# Patient Record
Sex: Female | Born: 1945 | Race: Black or African American | Hispanic: No | Marital: Single | Smoking: Never smoker
Health system: Southern US, Community
[De-identification: ages and names within clinical notes are randomized; demographics above are authoritative.]

## PROBLEM LIST (undated history)

## (undated) DIAGNOSIS — Z9289 Personal history of other medical treatment: Secondary | ICD-10-CM

## (undated) DIAGNOSIS — H269 Unspecified cataract: Secondary | ICD-10-CM

## (undated) DIAGNOSIS — E7800 Pure hypercholesterolemia, unspecified: Secondary | ICD-10-CM

## (undated) DIAGNOSIS — Z8619 Personal history of other infectious and parasitic diseases: Secondary | ICD-10-CM

## (undated) DIAGNOSIS — E559 Vitamin D deficiency, unspecified: Secondary | ICD-10-CM

## (undated) DIAGNOSIS — IMO0001 Reserved for inherently not codable concepts without codable children: Secondary | ICD-10-CM

## (undated) DIAGNOSIS — M129 Arthropathy, unspecified: Secondary | ICD-10-CM

## (undated) DIAGNOSIS — Z8669 Personal history of other diseases of the nervous system and sense organs: Secondary | ICD-10-CM

## (undated) DIAGNOSIS — Z86010 Personal history of colonic polyps: Secondary | ICD-10-CM

## (undated) DIAGNOSIS — F411 Generalized anxiety disorder: Secondary | ICD-10-CM

## (undated) DIAGNOSIS — R252 Cramp and spasm: Secondary | ICD-10-CM

## (undated) DIAGNOSIS — I10 Essential (primary) hypertension: Secondary | ICD-10-CM

## (undated) DIAGNOSIS — M545 Low back pain: Secondary | ICD-10-CM

## (undated) DIAGNOSIS — R7309 Other abnormal glucose: Secondary | ICD-10-CM

## (undated) DIAGNOSIS — N951 Menopausal and female climacteric states: Secondary | ICD-10-CM

## (undated) DIAGNOSIS — H3320 Serous retinal detachment, unspecified eye: Secondary | ICD-10-CM

## (undated) DIAGNOSIS — K219 Gastro-esophageal reflux disease without esophagitis: Secondary | ICD-10-CM

## (undated) DIAGNOSIS — M1990 Unspecified osteoarthritis, unspecified site: Secondary | ICD-10-CM

## (undated) DIAGNOSIS — E782 Mixed hyperlipidemia: Secondary | ICD-10-CM

## (undated) HISTORY — PX: ABDOMINAL HYSTERECTOMY: SHX81

## (undated) HISTORY — DX: Mixed hyperlipidemia: E78.2

## (undated) HISTORY — DX: Gastro-esophageal reflux disease without esophagitis: K21.9

## (undated) HISTORY — DX: Vitamin D deficiency, unspecified: E55.9

## (undated) HISTORY — PX: RETINAL DETACHMENT SURGERY: SHX105

## (undated) HISTORY — DX: Personal history of colonic polyps: Z86.010

## (undated) HISTORY — PX: CATARACT EXTRACTION: SUR2

## (undated) HISTORY — DX: Personal history of other infectious and parasitic diseases: Z86.19

## (undated) HISTORY — DX: Menopausal and female climacteric states: N95.1

## (undated) HISTORY — DX: Unspecified cataract: H26.9

## (undated) HISTORY — DX: Unspecified osteoarthritis, unspecified site: M19.90

## (undated) HISTORY — DX: Arthropathy, unspecified: M12.9

## (undated) HISTORY — DX: Personal history of other medical treatment: Z92.89

## (undated) HISTORY — DX: Cramp and spasm: R25.2

## (undated) HISTORY — DX: Other abnormal glucose: R73.09

## (undated) HISTORY — DX: Personal history of other diseases of the nervous system and sense organs: Z86.69

## (undated) HISTORY — DX: Essential (primary) hypertension: I10

## (undated) HISTORY — DX: Serous retinal detachment, unspecified eye: H33.20

## (undated) HISTORY — DX: Low back pain: M54.5

## (undated) HISTORY — DX: Reserved for inherently not codable concepts without codable children: IMO0001

## (undated) HISTORY — DX: Generalized anxiety disorder: F41.1

---

## 2005-11-29 ENCOUNTER — Ambulatory Visit: Payer: Self-pay | Admitting: Internal Medicine

## 2006-01-02 ENCOUNTER — Ambulatory Visit: Payer: Self-pay | Admitting: Internal Medicine

## 2006-01-15 ENCOUNTER — Ambulatory Visit: Payer: Self-pay | Admitting: Internal Medicine

## 2006-01-15 LAB — CONVERTED CEMR LAB
ALT: 22 units/L (ref 0–40)
AST: 19 units/L (ref 0–37)
Basophils Absolute: 0 10*3/uL (ref 0.0–0.1)
Cholesterol: 188 mg/dL (ref 0–200)
Ferritin: 173.3 ng/mL (ref 10.0–291.0)
Lymphocytes Relative: 23.1 % (ref 12.0–46.0)
MCHC: 32.7 g/dL (ref 30.0–36.0)
Monocytes Absolute: 0.7 10*3/uL (ref 0.2–0.7)
Neutro Abs: 2.4 10*3/uL (ref 1.4–7.7)
Neutrophils Relative %: 57.6 % (ref 43.0–77.0)
Triglyceride fasting, serum: 76 mg/dL (ref 0–149)

## 2006-03-17 ENCOUNTER — Ambulatory Visit: Payer: Self-pay | Admitting: Internal Medicine

## 2006-03-17 LAB — CONVERTED CEMR LAB
Hemoglobin: 12.8 g/dL (ref 12.0–15.0)
MCHC: 34.1 g/dL (ref 30.0–36.0)
MCV: 82.8 fL (ref 78.0–100.0)
Monocytes Relative: 16.6 % — ABNORMAL HIGH (ref 3.0–11.0)
Neutrophils Relative %: 47.5 % (ref 43.0–77.0)
RDW: 13.1 % (ref 11.5–14.6)

## 2006-03-27 ENCOUNTER — Ambulatory Visit: Payer: Self-pay | Admitting: Internal Medicine

## 2006-05-13 ENCOUNTER — Ambulatory Visit: Payer: Self-pay | Admitting: Internal Medicine

## 2006-06-12 ENCOUNTER — Ambulatory Visit: Payer: Self-pay | Admitting: Internal Medicine

## 2006-06-12 LAB — CONVERTED CEMR LAB
ALT: 15 units/L (ref 0–40)
AST: 21 units/L (ref 0–37)
Basophils Relative: 0.5 % (ref 0.0–1.0)
Cholesterol: 181 mg/dL (ref 0–200)
Eosinophils Absolute: 0.2 10*3/uL (ref 0.0–0.6)
HCT: 36 % (ref 36.0–46.0)
MCV: 84.5 fL (ref 78.0–100.0)
Total CHOL/HDL Ratio: 2.6

## 2006-06-19 ENCOUNTER — Ambulatory Visit: Payer: Self-pay | Admitting: Internal Medicine

## 2006-07-31 ENCOUNTER — Encounter: Admission: RE | Admit: 2006-07-31 | Discharge: 2006-07-31 | Payer: Self-pay | Admitting: Internal Medicine

## 2006-11-11 DIAGNOSIS — F411 Generalized anxiety disorder: Secondary | ICD-10-CM

## 2006-11-11 DIAGNOSIS — I10 Essential (primary) hypertension: Secondary | ICD-10-CM

## 2006-11-11 HISTORY — DX: Essential (primary) hypertension: I10

## 2006-11-11 HISTORY — DX: Generalized anxiety disorder: F41.1

## 2007-01-06 ENCOUNTER — Ambulatory Visit: Payer: Self-pay | Admitting: Internal Medicine

## 2007-01-06 LAB — CONVERTED CEMR LAB
Nitrite: NEGATIVE
Specific Gravity, Urine: 1.02
Urobilinogen, UA: 0.2

## 2007-01-08 LAB — CONVERTED CEMR LAB
AST: 19 units/L (ref 0–37)
Alkaline Phosphatase: 55 units/L (ref 39–117)
Basophils Absolute: 0 10*3/uL (ref 0.0–0.1)
Basophils Relative: 0.5 % (ref 0.0–1.0)
Bilirubin, Direct: 0.1 mg/dL (ref 0.0–0.3)
Creatinine, Ser: 1.4 mg/dL — ABNORMAL HIGH (ref 0.4–1.2)
Eosinophils Absolute: 0.1 10*3/uL (ref 0.0–0.6)
GFR calc Af Amer: 49 mL/min
Lymphocytes Relative: 34.3 % (ref 12.0–46.0)
MCHC: 33.3 g/dL (ref 30.0–36.0)
MCV: 84.9 fL (ref 78.0–100.0)
Monocytes Absolute: 0.4 10*3/uL (ref 0.2–0.7)
Neutro Abs: 1.6 10*3/uL (ref 1.4–7.7)
Neutrophils Relative %: 49.7 % (ref 43.0–77.0)
Platelets: 165 10*3/uL (ref 150–400)
TSH: 0.96 microintl units/mL (ref 0.35–5.50)
VLDL: 26 mg/dL (ref 0–40)

## 2007-01-12 ENCOUNTER — Ambulatory Visit: Payer: Self-pay | Admitting: Internal Medicine

## 2007-01-12 DIAGNOSIS — M129 Arthropathy, unspecified: Secondary | ICD-10-CM

## 2007-01-12 DIAGNOSIS — E782 Mixed hyperlipidemia: Secondary | ICD-10-CM | POA: Insufficient documentation

## 2007-01-12 DIAGNOSIS — R7309 Other abnormal glucose: Secondary | ICD-10-CM | POA: Insufficient documentation

## 2007-01-12 HISTORY — DX: Mixed hyperlipidemia: E78.2

## 2007-01-12 HISTORY — DX: Arthropathy, unspecified: M12.9

## 2007-01-12 HISTORY — DX: Other abnormal glucose: R73.09

## 2007-02-04 ENCOUNTER — Ambulatory Visit: Payer: Self-pay | Admitting: Internal Medicine

## 2007-02-04 LAB — CONVERTED CEMR LAB
Creatinine, Ser: 1.3 mg/dL — ABNORMAL HIGH (ref 0.4–1.2)
GFR calc non Af Amer: 44 mL/min
Glucose, Bld: 101 mg/dL — ABNORMAL HIGH (ref 70–99)
Potassium: 5.4 meq/L — ABNORMAL HIGH (ref 3.5–5.1)
Sodium: 139 meq/L (ref 135–145)
Vit D, 1,25-Dihydroxy: 16 — ABNORMAL LOW (ref 30–89)

## 2007-02-08 ENCOUNTER — Encounter (INDEPENDENT_AMBULATORY_CARE_PROVIDER_SITE_OTHER): Payer: Self-pay | Admitting: Internal Medicine

## 2007-02-08 ENCOUNTER — Ambulatory Visit: Payer: Self-pay | Admitting: Internal Medicine

## 2007-02-08 ENCOUNTER — Observation Stay (HOSPITAL_COMMUNITY): Admission: EM | Admit: 2007-02-08 | Discharge: 2007-02-08 | Payer: Self-pay | Admitting: Emergency Medicine

## 2007-02-10 ENCOUNTER — Ambulatory Visit: Payer: Self-pay | Admitting: Internal Medicine

## 2007-02-10 DIAGNOSIS — E559 Vitamin D deficiency, unspecified: Secondary | ICD-10-CM | POA: Insufficient documentation

## 2007-02-10 DIAGNOSIS — Z8669 Personal history of other diseases of the nervous system and sense organs: Secondary | ICD-10-CM | POA: Insufficient documentation

## 2007-02-10 HISTORY — DX: Vitamin D deficiency, unspecified: E55.9

## 2007-02-10 HISTORY — DX: Personal history of other diseases of the nervous system and sense organs: Z86.69

## 2007-02-13 ENCOUNTER — Encounter: Payer: Self-pay | Admitting: Internal Medicine

## 2007-02-13 ENCOUNTER — Ambulatory Visit: Payer: Self-pay

## 2007-02-17 ENCOUNTER — Ambulatory Visit: Payer: Self-pay | Admitting: Cardiology

## 2007-02-25 ENCOUNTER — Encounter: Payer: Self-pay | Admitting: Internal Medicine

## 2007-02-25 ENCOUNTER — Ambulatory Visit: Payer: Self-pay | Admitting: Cardiology

## 2007-02-25 ENCOUNTER — Ambulatory Visit: Payer: Self-pay

## 2007-04-28 ENCOUNTER — Ambulatory Visit: Payer: Self-pay | Admitting: Internal Medicine

## 2007-04-28 LAB — CONVERTED CEMR LAB: Vit D, 1,25-Dihydroxy: 41 (ref 30–89)

## 2007-05-04 ENCOUNTER — Ambulatory Visit: Payer: Self-pay | Admitting: Internal Medicine

## 2007-05-19 ENCOUNTER — Ambulatory Visit: Payer: Self-pay | Admitting: Internal Medicine

## 2007-06-29 ENCOUNTER — Ambulatory Visit: Payer: Self-pay | Admitting: Internal Medicine

## 2007-06-29 LAB — CONVERTED CEMR LAB: Blood Glucose, Fingerstick: 105

## 2007-08-03 ENCOUNTER — Encounter: Admission: RE | Admit: 2007-08-03 | Discharge: 2007-08-03 | Payer: Self-pay | Admitting: Internal Medicine

## 2007-08-03 ENCOUNTER — Ambulatory Visit: Payer: Self-pay | Admitting: Internal Medicine

## 2007-09-25 ENCOUNTER — Ambulatory Visit: Payer: Self-pay | Admitting: Internal Medicine

## 2007-09-25 LAB — CONVERTED CEMR LAB
CO2: 25 meq/L (ref 19–32)
Chloride: 108 meq/L (ref 96–112)
GFR calc non Af Amer: 35 mL/min
Hgb A1c MFr Bld: 6.4 % — ABNORMAL HIGH (ref 4.6–6.0)

## 2007-10-02 ENCOUNTER — Ambulatory Visit: Payer: Self-pay | Admitting: Internal Medicine

## 2007-12-02 ENCOUNTER — Ambulatory Visit: Payer: Self-pay | Admitting: Internal Medicine

## 2008-01-14 ENCOUNTER — Telehealth: Payer: Self-pay | Admitting: Internal Medicine

## 2008-04-07 ENCOUNTER — Ambulatory Visit: Payer: Self-pay | Admitting: Internal Medicine

## 2008-04-07 DIAGNOSIS — N951 Menopausal and female climacteric states: Secondary | ICD-10-CM

## 2008-04-07 HISTORY — DX: Menopausal and female climacteric states: N95.1

## 2008-05-02 ENCOUNTER — Telehealth (INDEPENDENT_AMBULATORY_CARE_PROVIDER_SITE_OTHER): Payer: Self-pay | Admitting: *Deleted

## 2008-05-06 ENCOUNTER — Telehealth: Payer: Self-pay | Admitting: *Deleted

## 2008-07-11 ENCOUNTER — Ambulatory Visit: Payer: Self-pay | Admitting: Internal Medicine

## 2008-07-11 DIAGNOSIS — M545 Low back pain, unspecified: Secondary | ICD-10-CM | POA: Insufficient documentation

## 2008-07-11 HISTORY — DX: Low back pain, unspecified: M54.50

## 2008-07-11 LAB — CONVERTED CEMR LAB
Blood in Urine, dipstick: NEGATIVE
Nitrite: NEGATIVE
Urobilinogen, UA: 0.2

## 2008-08-04 ENCOUNTER — Encounter: Admission: RE | Admit: 2008-08-04 | Discharge: 2008-08-04 | Payer: Self-pay | Admitting: Internal Medicine

## 2008-09-01 ENCOUNTER — Ambulatory Visit: Payer: Self-pay | Admitting: Internal Medicine

## 2008-09-01 LAB — CONVERTED CEMR LAB
ALT: 20 units/L (ref 0–35)
Calcium: 9.4 mg/dL (ref 8.4–10.5)
Chloride: 108 meq/L (ref 96–112)
Cholesterol: 193 mg/dL (ref 0–200)
Glucose, Bld: 106 mg/dL — ABNORMAL HIGH (ref 70–99)
HDL: 64.4 mg/dL (ref >39.00)
Potassium: 4.4 meq/L (ref 3.5–5.1)
Sodium: 139 meq/L (ref 135–145)
TSH: 0.59 microintl units/mL (ref 0.35–5.50)
VLDL: 22.6 mg/dL (ref 0.0–40.0)

## 2008-09-08 ENCOUNTER — Ambulatory Visit: Payer: Self-pay | Admitting: Internal Medicine

## 2008-11-10 ENCOUNTER — Ambulatory Visit: Payer: Self-pay | Admitting: Internal Medicine

## 2009-01-12 ENCOUNTER — Ambulatory Visit: Payer: Self-pay | Admitting: Internal Medicine

## 2009-01-12 DIAGNOSIS — R252 Cramp and spasm: Secondary | ICD-10-CM

## 2009-01-12 HISTORY — DX: Cramp and spasm: R25.2

## 2009-01-23 LAB — CONVERTED CEMR LAB
BUN: 14 mg/dL (ref 6–23)
Chloride: 104 meq/L (ref 96–112)
GFR calc non Af Amer: 52.89 mL/min (ref >60)
Magnesium: 2.1 mg/dL (ref 1.5–2.5)
Potassium: 4.8 meq/L (ref 3.5–5.1)
Sodium: 141 meq/L (ref 135–145)

## 2009-06-12 ENCOUNTER — Ambulatory Visit: Payer: Self-pay | Admitting: Internal Medicine

## 2009-06-19 ENCOUNTER — Encounter (INDEPENDENT_AMBULATORY_CARE_PROVIDER_SITE_OTHER): Payer: Self-pay | Admitting: *Deleted

## 2009-06-26 ENCOUNTER — Encounter (INDEPENDENT_AMBULATORY_CARE_PROVIDER_SITE_OTHER): Payer: Self-pay | Admitting: *Deleted

## 2009-06-28 ENCOUNTER — Ambulatory Visit: Payer: Self-pay | Admitting: Internal Medicine

## 2009-07-01 HISTORY — PX: COLONOSCOPY: SHX174

## 2009-07-11 ENCOUNTER — Ambulatory Visit: Payer: Self-pay | Admitting: Internal Medicine

## 2009-07-11 ENCOUNTER — Telehealth (INDEPENDENT_AMBULATORY_CARE_PROVIDER_SITE_OTHER): Payer: Self-pay

## 2009-07-11 DIAGNOSIS — Z860100 Personal history of colon polyps, unspecified: Secondary | ICD-10-CM

## 2009-07-11 HISTORY — DX: Personal history of colonic polyps: Z86.010

## 2009-07-11 HISTORY — DX: Personal history of colon polyps, unspecified: Z86.0100

## 2009-07-11 LAB — HM COLONOSCOPY

## 2009-07-14 ENCOUNTER — Telehealth: Payer: Self-pay | Admitting: Internal Medicine

## 2009-07-20 ENCOUNTER — Encounter: Payer: Self-pay | Admitting: Internal Medicine

## 2009-07-26 ENCOUNTER — Encounter: Payer: Self-pay | Admitting: Internal Medicine

## 2009-08-07 ENCOUNTER — Encounter: Admission: RE | Admit: 2009-08-07 | Discharge: 2009-08-07 | Payer: Self-pay | Admitting: Internal Medicine

## 2009-08-07 LAB — HM MAMMOGRAPHY: HM Mammogram: NEGATIVE

## 2009-08-25 HISTORY — PX: COLON SURGERY: SHX602

## 2009-08-25 HISTORY — PX: OTHER SURGICAL HISTORY: SHX169

## 2009-08-31 ENCOUNTER — Inpatient Hospital Stay (HOSPITAL_COMMUNITY): Admission: RE | Admit: 2009-08-31 | Discharge: 2009-09-03 | Payer: Self-pay | Admitting: Surgery

## 2009-08-31 ENCOUNTER — Encounter (INDEPENDENT_AMBULATORY_CARE_PROVIDER_SITE_OTHER): Payer: Self-pay | Admitting: Surgery

## 2009-09-11 ENCOUNTER — Encounter: Payer: Self-pay | Admitting: Internal Medicine

## 2009-10-09 ENCOUNTER — Encounter: Payer: Self-pay | Admitting: Internal Medicine

## 2009-10-16 ENCOUNTER — Ambulatory Visit: Payer: Self-pay | Admitting: Internal Medicine

## 2009-10-19 LAB — CONVERTED CEMR LAB
ALT: 20 units/L (ref 0–35)
Alkaline Phosphatase: 52 units/L (ref 39–117)
Bilirubin, Direct: 0.1 mg/dL (ref 0.0–0.3)
CO2: 28 meq/L (ref 19–32)
Calcium: 10.1 mg/dL (ref 8.4–10.5)
Chloride: 105 meq/L (ref 96–112)
Cholesterol: 197 mg/dL (ref 0–200)
Glucose, Bld: 102 mg/dL — ABNORMAL HIGH (ref 70–99)
HCT: 35.9 % — ABNORMAL LOW (ref 36.0–46.0)
HDL: 87.9 mg/dL (ref >39.00)
Hemoglobin: 12.1 g/dL (ref 12.0–15.0)
Lymphs Abs: 1.3 10*3/uL (ref 0.7–4.0)
Monocytes Relative: 13 % — ABNORMAL HIGH (ref 3.0–12.0)
Neutrophils Relative %: 54.2 % (ref 43.0–77.0)
RBC: 4.28 M/uL (ref 3.87–5.11)
RDW: 15.4 % — ABNORMAL HIGH (ref 11.5–14.6)
TSH: 0.78 microintl units/mL (ref 0.35–5.50)
Total Bilirubin: 1 mg/dL (ref 0.3–1.2)
Total CHOL/HDL Ratio: 2
Total Protein: 6.6 g/dL (ref 6.0–8.3)
Triglycerides: 82 mg/dL (ref 0.0–149.0)
WBC: 4 10*3/uL — ABNORMAL LOW (ref 4.5–10.5)

## 2010-02-25 DIAGNOSIS — IMO0001 Reserved for inherently not codable concepts without codable children: Secondary | ICD-10-CM

## 2010-02-25 HISTORY — DX: Reserved for inherently not codable concepts without codable children: IMO0001

## 2010-03-18 ENCOUNTER — Encounter: Payer: Self-pay | Admitting: Internal Medicine

## 2010-03-26 ENCOUNTER — Encounter: Payer: Self-pay | Admitting: Internal Medicine

## 2010-03-27 NOTE — Assessment & Plan Note (Signed)
Summary: 6 month fup-will fast//ccm   Vital Signs:  Patient profile:   65 year old female Menstrual status:  hysterectomy Height:      68 inches Weight:      189 pounds BMI:     28.84 Pulse rate:   66 / minute BP sitting:   130 / 80  (left arm) Cuff size:   regular  Vitals Entered By: Romualdo Bolk, CMA (AAMA) (June 12, 2009 8:02 AM) CC: Follow-up visit on meds, Hypertension Management   History of Present Illness: Cynthia Estrada comes in comes in today   for multiple medical problems .   HT :    120 /80   no se of meds at present LIPIDs:  no se . of meds  sleep : 4-5 hours  falls asleep in chair  no other sleep problem OA  left finger and right jaw pain with chewing  no t assoicated with other activiities . No  dentist at present  (cost)  teeth ok.  takes meds as needed. Leg cramps better .   Gi stable  Hypertension History:      She denies headache, chest pain, palpitations, dyspnea with exertion, orthopnea, PND, peripheral edema, visual symptoms, neurologic problems, syncope, and side effects from treatment.  She notes no problems with any antihypertensive medication side effects.        Positive major cardiovascular risk factors include female age 40 years old or older, hyperlipidemia, and hypertension.  Negative major cardiovascular risk factors include non-tobacco-user status.     Preventive Screening-Counseling & Management  Alcohol-Tobacco     Alcohol drinks/day: 0     Smoking Status: never  Caffeine-Diet-Exercise     Caffeine use/day: 1     Does Patient Exercise: yes     Type of exercise: walking      Times/week: <3  Current Medications (verified): 1)  Prilosec Otc 20 Mg Tbec (Omeprazole Magnesium) 2)  Vitamin D 1000 Mg 3)  Simvastatin 40 Mg Tabs (Simvastatin) .Marland Kitchen.. 1 By Mouth Once Daily 4)  Hydrocodone-Acetaminophen 7.5-325 Mg  Tabs (Hydrocodone-Acetaminophen) .... Take 1-2 Tabs As Needed. 5)  Cyclobenzaprine Hcl 10 Mg Tabs (Cyclobenzaprine Hcl) .Marland Kitchen.. 1  By Mouth Three Times A Day Prn Back Spasms 6)  Losartan Potassium-Hctz 50-12.5 Mg Tabs (Losartan Potassium-Hctz) .Marland Kitchen.. 1 By Mouth Once Daily  Allergies (verified): 1)  * Lisinopril  Past History:  Past medical, surgical, family and social histories (including risk factors) reviewed, and no changes noted (except as noted below).  Past Medical History: Reviewed history from 04/07/2008 and no changes required. Anxiety High Cholesterol Arthritis Hypertension chickenpox as a child    Consults None    Past Surgical History: Reviewed history from 04/07/2008 and no changes required. Hysterectomy age 54? for fibroids      Past History:  Care Management: None Current  Family History: Reviewed history from 06/29/2007 and no changes required. Family History of Arthritis Family History Diabetes 1st degree relative mom Family History of Cardiovascular disorder Family History of Alcoholism/Addiction Family History Hypertension Family History High cholesterol  Social History: Reviewed history from 04/07/2008 and no changes required. Occupation: retired from [employer]  moving .  Never Smoked Alcohol use-yes Drug use-no Regular exercise-yes      Review of Systems  The patient denies anorexia, fever, weight loss, weight gain, vision loss, decreased hearing, hoarseness, chest pain, syncope, dyspnea on exertion, prolonged cough, abdominal pain, melena, hematochezia, transient blindness, difficulty walking, abnormal bleeding, enlarged lymph nodes, and angioedema.  left ring finger stiff in am   Physical Exam  General:  Well-developed,well-nourished,in no acute distress; alert,appropriate and cooperative throughout examination Head:  normocephalic and atraumatic.   Eyes:  vision grossly intact.   Ears:  R ear normal and L ear normal.   tmj area slightly tenderer with opening and closing  Nose:  no external deformity and no nasal discharge.   Mouth:  pharynx pink  and moist.   Neck:  No deformities, masses, or tenderness noted. Lungs:  Normal respiratory effort, chest expands symmetrically. Lungs are clear to auscultation, no crackles or wheezes. Heart:  Normal rate and regular rhythm. S1 and S2 normal without gallop, murmur, click, rub or other extra sounds.no lifts.   Abdomen:  Bowel sounds positive,abdomen soft and non-tender without masses, organomegaly or hernias noted. Msk:  left ring finger slightly swollen and no redness or warmth  Extremities:  no clubbing cyanosis or edema  Neurologic:  alert & oriented X3 and gait normal.   Skin:  turgor normal and color normal.   Cervical Nodes:  No lymphadenopathy noted Psych:  Oriented X3, good eye contact, not anxious appearing, and not depressed appearing.     Impression & Recommendations:  Problem # 1:  HYPERTENSION (ICD-401.9)  Her updated medication list for this problem includes:    Losartan Potassium-hctz 50-12.5 Mg Tabs (Losartan potassium-hctz) .Marland Kitchen... 1 by mouth once daily  BP today: 130/80 Prior BP: 140/82 (01/12/2009)  10 Yr Risk Heart Disease: 7 % Prior 10 Yr Risk Heart Disease: 9 % (01/12/2009)  Labs Reviewed: K+: 4.8 (01/12/2009) Creat: : 1.3 (01/12/2009)   Chol: 193 (09/01/2008)   HDL: 64.40 (09/01/2008)   LDL: 106 (09/01/2008)   TG: 113.0 (09/01/2008)  Problem # 2:  HYPERLIPIDEMIA (ICD-272.2)  Her updated medication list for this problem includes:    Simvastatin 40 Mg Tabs (Simvastatin) .Marland Kitchen... 1 by mouth once daily  Labs Reviewed: SGOT: 26 (09/01/2008)   SGPT: 20 (09/01/2008)  10 Yr Risk Heart Disease: 7 % Prior 10 Yr Risk Heart Disease: 9 % (01/12/2009)   HDL:64.40 (09/01/2008), 70.0 (01/06/2007)  LDL:106 (09/01/2008), DEL (01/06/2007)  Chol:193 (09/01/2008), 205 (01/06/2007)  Trig:113.0 (09/01/2008), 128 (01/06/2007)  Problem # 3:  UNSPECIFIED ARTHROPATHY SITE UNSPECIFIED (ICD-716.90) jaw pain right seems tmj related and also hands   Problem # 4:  FASTING HYPERGLYCEMIA  (ICD-790.29) Assessment: Improved better   Complete Medication List: 1)  Prilosec Otc 20 Mg Tbec (Omeprazole magnesium) 2)  Vitamin D 1000 Mg  3)  Simvastatin 40 Mg Tabs (Simvastatin) .Marland Kitchen.. 1 by mouth once daily 4)  Hydrocodone-acetaminophen 7.5-325 Mg Tabs (Hydrocodone-acetaminophen) .... Take 1-2 tabs as needed. 5)  Cyclobenzaprine Hcl 10 Mg Tabs (Cyclobenzaprine hcl) .Marland Kitchen.. 1 by mouth three times a day prn back spasms 6)  Losartan Potassium-hctz 50-12.5 Mg Tabs (Losartan potassium-hctz) .Marland Kitchen.. 1 by mouth once daily  Other Orders: Gastroenterology Referral (GI)  Hypertension Assessment/Plan:      The patient's hypertensive risk group is category B: At least one risk factor (excluding diabetes) with no target organ damage.  Her calculated 10 year risk of coronary heart disease is 7 %.  Today's blood pressure is 130/80.  Her blood pressure goal is < 140/90.  Patient Instructions: 1)  continue medications   2)  schedule  for welcome to medicare check up  in the summer . 3)  Will refer for  colonscopy . Prescriptions: HYDROCODONE-ACETAMINOPHEN 7.5-325 MG  TABS (HYDROCODONE-ACETAMINOPHEN) Take 1-2 tabs as needed.  #40 x 1   Entered and Authorized  by:   Madelin Headings MD   Signed by:   Madelin Headings MD on 06/12/2009   Method used:   Print then Give to Patient   RxID:   [id number]

## 2010-03-27 NOTE — Letter (Signed)
Summary: [facility]   Imported By: Lester Yetter 10/20/2009 10:22:21  _____________________________________________________________________  External Attachment:    Type:   Image     Comment:   External Document

## 2010-03-27 NOTE — Consult Note (Signed)
Summary: [facility]   Imported By: Lennie Odor 09/14/2009 14:54:27  _____________________________________________________________________  External Attachment:    Type:   Image     Comment:   External Document

## 2010-03-27 NOTE — Progress Notes (Signed)
Summary: Schedule surgical referral   Phone Note Outgoing Call Call back at [facility] Phone [phone]   Call placed by: Darcey Nora RN, CGRN,  Jul 11, 2009 3:53 PM Call placed to: Patient Summary of Call: Left message for patient to call back to discuss surgical referral to [facility] Dr  Michaell Cowing 07/26/09 9:00 arrival time for 9:20 appointment . Initial call taken by: Darcey Nora RN, CGRN,  Jul 11, 2009 3:54 PM  Follow-up for Phone Call        patient aware of appointment date and time for [facility] Follow-up by: Darcey Nora RN, CGRN,  Jul 12, 2009 10:45 AM  New Problems: BENIGN NEOPLASM OF COLON (ICD-211.3)   New Problems: BENIGN NEOPLASM OF COLON (ICD-211.3)

## 2010-03-27 NOTE — Procedures (Signed)
Summary: Colonoscopy  Patient: [name] Note: All result statuses are Final unless otherwise noted.  Tests: (1) Colonoscopy (COL)   COL Colonoscopy           DONE     [facility]     [address]     [place], [place]  [address]           COLONOSCOPY PROCEDURE REPORT           PATIENT:  Cynthia Estrada, Cynthia Estrada  MR#:  [id number]     BIRTHDATE:  04/28/1945, 64 yrs. old  GENDER:  female     ENDOSCOPIST:  Iva Boop, MD, [facility]     REF. BY:  Neta Mends. Panosh, M.D.     PROCEDURE DATE:  07/11/2009     PROCEDURE:  Colonoscopy with biopsy     ASA CLASS:  Class II     INDICATIONS:  Routine Risk Screening     MEDICATIONS:   Fentanyl 50 mcg IV, Versed 7 mg IV           DESCRIPTION OF PROCEDURE:   After the risks benefits and     alternatives of the procedure were thoroughly explained, informed     consent was obtained.  Digital rectal exam was performed and     revealed no abnormalities.   The LB [id number] endoscope     was introduced through the anus and advanced to the cecum, which     was identified by both the appendix and ileocecal valve, without     limitations.  The quality of the prep was adequate, using     MoviPrep.  The instrument was then slowly withdrawn as the colon     was fully examined. Insertion: 3:46 minutes withdrawal: 13:18     minutes     <<PROCEDUREIMAGES>>           FINDINGS:  A sessile polyp was found in the cecum. Large carpet     polyp in base of cecum, too large to snare. At least a few cm.     Multiple biopsies were obtained and sent to pathology.  A sessile     polyp was found in the ascending colon, just outside the cecum.     Large polyp separate fron cecal polyp, occupies 1/4 - 1/3     circumference, on a fold, also too large to snare. Multiple     biopsies were obtained and sent to pathology.  This was otherwise     a normal examination of the colon. Including right colon     retroflexion.   Retroflexed views in the rectum revealed  internal     hemorrhoids.    The scope was then withdrawn from the patient and     the procedure completed.           COMPLICATIONS:  None     ENDOSCOPIC IMPRESSION:     1) Sessile polyp in the cecum - large carpet polyp - biopsied     2) Sessile polyp in the ascending colon - large polyp 1/4-1/3 of     circumference, on a fold - biopsied     3) Internal hemorrhoids     4) Otherwise normal examination, adequate prep           RECOMMENDATIONS:     Refer to surgery to evaluate and treat large right colon polyps.           REPEAT EXAM:  In for Colonoscopy,  pending biopsy results. likely 1     year           Iva Boop, MD, [facility]           CC:  Madelin Headings, MD     The Patient           n.     Rosalie Doctor:   Iva Boop at 07/11/2009 11:49 AM           Robby Sermon, [id number]  Note: An exclamation mark (!) indicates a result that was not dispersed into the flowsheet. Document Creation Date: 07/13/2009 5:07 PM _______________________________________________________________________  (1) Order result status: Final Collection or observation date-time: 07/11/2009 11:34 Requested date-time:  Receipt date-time:  Reported date-time:  Referring Physician:   Ordering Physician: Stan Head [phone]) Specimen Source:  Source: Launa Grill Order Number: [id number] Lab site:   Appended Document: Colonoscopy     Procedures Next Due Date:    Colonoscopy: 07/2010

## 2010-03-27 NOTE — Miscellaneous (Signed)
  Clinical Lists Changes  Problems: Removed problem of SCREENING, COLON CANCER (ICD-V76.51) Removed problem of FAMILY HISTORY DIABETES 1ST DEGREE RELATIVE (ICD-V18.0) Removed problem of FAMILY HISTORY OF ALCOHOLISM/ADDICTION (ICD-V61.41) Removed problem of PHYSICAL EXAMINATION (ICD-V70.0) Removed problem of UNSPEC D/O RESULT FROM IMPAIRED RENAL FUNCTION (ICD-588.9) Removed problem of ADVERSE REACTION TO MEDICATION (ZOX-096.04) Removed problem of CHEST DISCOMFORT (ICD-786.59) Removed problem of DYSPNEA ON EXERTION ? (ICD-786.09)

## 2010-03-27 NOTE — Progress Notes (Signed)
Summary: polyp path  Phone Note Outgoing Call   Summary of Call: please let her know no cancer in polyps Iva Boop MD, [facility]  Jul 14, 2009 7:16 AM  No answer at home number, work number went to voicemail that was not patient's--no message left.  I will try to contact pt again later. Francee Piccolo CMA [facility])  Jul 14, 2009 9:26 AM   No answer at home number. Francee Piccolo CMA [facility])  Jul 14, 2009 1:02 PM   Message left at home number. Francee Piccolo CMA [facility])  Jul 14, 2009 3:00 PM  Initial call taken by: Francee Piccolo CMA [facility]),  Jul 14, 2009 3:00 PM  Follow-up for Phone Call        message left at home number to return call.  Work number is not patient's number according to gentleman answering phone.   Francee Piccolo CMA [facility])  Jul 18, 2009 11:00 AM    Additional Follow-up for Phone Call Additional follow up Details #1::        I also called have now sent letter and she has [facility] follow-up we will need to follow-up that sghe saw surgeon am flagging for early June she has appt Jun1 w/Dr. Michaell Cowing Additional Follow-up by: Iva Boop MD, Clementeen Graham,  Jul 20, 2009 1:45 PM

## 2010-03-27 NOTE — Letter (Signed)
Summary: Previsit letter  [facility]  [address] [place], [place] [address]   Phone: [phone]  Fax: [phone]       06/19/2009 MRN: [id number]  Cynthia Estrada [address] [place], [place]  [address]  Dear Ms. Cynthia Estrada,  Welcome to the Gastroenterology Division at [facility].    You are scheduled to see a nurse for your pre-procedure visit on 06-28-09 at 10:00a.m. on the 3rd floor at [facility], [address]  We ask that you try to arrive at our office 15 minutes prior to your appointment time to allow for check-in.  Your nurse visit will consist of discussing your medical and surgical history, your immediate family medical history, and your medications.    Please bring a complete list of all your medications or, if you prefer, bring the medication bottles and we will list them.  We will need to be aware of both prescribed and over the counter drugs.  We will need to know exact dosage information as well.  If you are on blood thinners (Coumadin, Plavix, Aggrenox, Ticlid, etc.) please call our office today/prior to your appointment, as we need to consult with your physician about holding your medication.   Please be prepared to read and sign documents such as consent forms, a financial agreement, and acknowledgement forms.  If necessary, and with your consent, a friend or relative is welcome to sit-in on the nurse visit with you.  Please bring your insurance card so that we may make a copy of it.  If your insurance requires a referral to see a specialist, please bring your referral form from your primary care physician.  No co-pay is required for this nurse visit.     If you cannot keep your appointment, please call [phone] to cancel or reschedule prior to your appointment date.  This allows [place] the opportunity to schedule an appointment for another patient in need of care.    Thank you for choosing [facility] for your medical needs.   We appreciate the opportunity to care for you.  Please visit [place] at our website  to learn more about our practice.                     Sincerely.                                                                                                                   The Gastroenterology Division

## 2010-03-27 NOTE — Miscellaneous (Signed)
Summary: [facility]  Clinical Lists Changes  Medications: Added new medication of MOVIPREP 100 GM  SOLR (PEG-KCL-NACL-NASULF-NA ASC-C) As per prep instructions. - Signed Rx of MOVIPREP 100 GM  SOLR (PEG-KCL-NACL-NASULF-NA ASC-C) As per prep instructions.;  #1 x 0;  Signed;  Entered by: Ezra Sites RN;  Authorized by: Iva Boop MD, [facility];  Method used: Electronically to [facility] [address] [place], [place], [place]  [address], Ph: [phone], Fax: [phone] Observations: Added new observation of ALLERGY REV: Done (06/28/2009 9:20)    Prescriptions: MOVIPREP 100 GM  SOLR (PEG-KCL-NACL-NASULF-NA ASC-C) As per prep instructions.  #1 x 0   Entered by:   Ezra Sites RN   Authorized by:   Iva Boop MD, [facility]   Signed by:   Ezra Sites RN on 06/28/2009   Method used:   Electronically to        [facility] (retail)       [address]       [place], [place]  [address]       Ph: [phone]       Fax: [phone]   RxID:   [id number]

## 2010-03-27 NOTE — Letter (Signed)
Summary: [facility] Instructions  [facility]  [address] [place], [place] [address]   Phone: [phone]  Fax: [phone]       Cynthia Estrada    [birth year]    MRN: [id number]        Procedure Day /Date:  Tuesday 07/11/2009     Arrival Time: 9:30 am      Procedure Time: 10:30 am     Location of Procedure:                    _x _  [facility] (4th Floor)                        PREPARATION FOR COLONOSCOPY WITH MOVIPREP   Starting 5 days prior to your procedure Thursday 5/12 do not eat nuts, seeds, popcorn, corn, beans, peas,  salads, or any raw vegetables.  Do not take any fiber supplements (e.g. Metamucil, Citrucel, and Benefiber).  THE DAY BEFORE YOUR PROCEDURE         DATE: Monday 5/16  1.  Drink clear liquids the entire day-NO SOLID FOOD  2.  Do not drink anything colored red or purple.  Avoid juices with pulp.  No orange juice.  3.  Drink at least 64 oz. (8 glasses) of fluid/clear liquids during the day to prevent dehydration and help the prep work efficiently.  CLEAR LIQUIDS INCLUDE: Water Jello Ice Popsicles Tea (sugar ok, no milk/cream) Powdered fruit flavored drinks Coffee (sugar ok, no milk/cream) Gatorade Juice: apple, white grape, white cranberry  Lemonade Clear bullion, consomm, broth Carbonated beverages (any kind) Strained chicken noodle soup Hard Candy                             4.  In the morning, mix first dose of MoviPrep solution:    Empty 1 Pouch A and 1 Pouch B into the disposable container    Add lukewarm drinking water to the top line of the container. Mix to dissolve    Refrigerate (mixed solution should be used within 24 hrs)  5.  Begin drinking the prep at 5:00 p.m. The MoviPrep container is divided by 4 marks.   Every 15 minutes drink the solution down to the next mark (approximately 8 oz) until the full liter is complete.   6.  Follow completed prep with 16 oz of clear liquid of your choice (Nothing  red or purple).  Continue to drink clear liquids until bedtime.  7.  Before going to bed, mix second dose of MoviPrep solution:    Empty 1 Pouch A and 1 Pouch B into the disposable container    Add lukewarm drinking water to the top line of the container. Mix to dissolve    Refrigerate  THE DAY OF YOUR PROCEDURE      DATE: Tuesday 5/17  Beginning at 5:30 a.m. (5 hours before procedure):         1. Every 15 minutes, drink the solution down to the next mark (approx 8 oz) until the full liter is complete.  2. Follow completed prep with 16 oz. of clear liquid of your choice.    3. You may drink clear liquids until 8:30 am  (2 HOURS BEFORE PROCEDURE).   MEDICATION INSTRUCTIONS  Unless otherwise instructed, you should take regular prescription medications with a small sip of water   as early as possible the morning  of your procedure.    Additional medication instructions: Do not take Losartan/HCTZ morning of procedure.         OTHER INSTRUCTIONS  You will need a responsible adult at least 66 years of age to accompany you and drive you home.   This person must remain in the waiting room during your procedure.  Wear loose fitting clothing that is easily removed.  Leave jewelry and other valuables at home.  However, you may wish to bring a book to read or  an iPod/MP3 player to listen to music as you wait for your procedure to start.  Remove all body piercing jewelry and leave at home.  Total time from sign-in until discharge is approximately 2-3 hours.  You should go home directly after your procedure and rest.  You can resume normal activities the  day after your procedure.  The day of your procedure you should not:   Drive   Make legal decisions   Operate machinery   Drink alcohol   Return to work  You will receive specific instructions about eating, activities and medications before you leave.    The above instructions have been reviewed and explained to  me by   Ezra Sites RN  Jun 28, 2009 9:55 AM     I fully understand and can verbalize these instructions _____________________________ Date _________

## 2010-03-27 NOTE — Assessment & Plan Note (Signed)
Summary: pt will come in fasting/njr   Vital Signs:  Patient profile:   65 year old female Menstrual status:  hysterectomy Height:      65.5 inches Weight:      177 pounds BMI:     29.11 Pulse rate:   66 / minute BP sitting:   130 / 80  (left arm) Cuff size:   regular  Vitals Entered By: Romualdo Bolk, CMA (AAMA) (October 16, 2009 9:07 AM)  Nutrition Counseling: Patient's BMI is greater than 25 and therefore counseled on weight management options. CC: Annual visit for disease management- Pt is fasting for labs   History of Present Illness: Cynthia Estrada comes in today  for preventive visit . Since last visit : she had surgical removal of high risk adenoma with dysplasia on July 7 th  with  right hemicoloctomy. No complications    an doing well.    recovering.   other wise no change in hx .  HT  good readings and no se of meds  LIPIDS: no se of med     Preventive Care Screening  Prior Values:    Mammogram:  ASSESSMENT: Negative - BI-RADS 1^MM DIGITAL SCREENING (08/07/2009)    Colonoscopy:  DONE (07/11/2009)    Last Flu Shot:  Fluvax 3+ (01/12/2009)   Preventive Screening-Counseling & Management  Alcohol-Tobacco     Alcohol drinks/day: 0     Smoking Status: never  Caffeine-Diet-Exercise     Caffeine use/day: 1     Does Patient Exercise: yes     Type of exercise: walking      Times/week: <3  Hep-HIV-STD-Contraception     Dental Visit-last 6 months yes     Sun Exposure-Excessive: no  Safety-Violence-Falls     Seat Belt Use: yes     Firearms in the Home: no firearms in the home     Smoke Detectors: yes  Current Medications (verified): 1)  Prilosec Otc 20 Mg Tbec (Omeprazole Magnesium) 2)  Vitamin D 1000 Mg 3)  Simvastatin 40 Mg Tabs (Simvastatin) .Marland Kitchen.. 1 By Mouth Once Daily 4)  Hydrocodone-Acetaminophen 7.5-325 Mg  Tabs (Hydrocodone-Acetaminophen) .... Take 1-2 Tabs As Needed. 5)  Cyclobenzaprine Hcl 10 Mg Tabs (Cyclobenzaprine Hcl) .Marland Kitchen.. 1 By Mouth Three  Times A Day Prn Back Spasms 6)  Losartan Potassium-Hctz 50-12.5 Mg Tabs (Losartan Potassium-Hctz) .Marland Kitchen.. 1 By Mouth Once Daily 7)  Vitamin C 500 Mg Chew (Ascorbic Acid)  Allergies (verified): 1)  * Lisinopril  Past History:  Past medical, surgical, family and social histories (including risk factors) reviewed, and no changes noted (except as noted below).  Past Medical History: Anxiety High Cholesterol Arthritis Hypertension chickenpox as a child   high risk colon polyp.  Consults None    Past Surgical History: Hysterectomy age 12? 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 Family History of Arthritis Family History Diabetes 1st degree relative mom Family History of Cardiovascular disorder Family History of Alcoholism/Addiction Family History Hypertension Family History High cholesterol  Social History: Reviewed history from 06/12/2009 and no changes required. Occupation: retired from [employer] f 1 no pets  Single  moving .  Never Smoked Alcohol use-yes Drug use-no Regular exercise-yes      Review of Systems  The patient denies anorexia, fever, weight loss, weight gain, vision loss,  decreased hearing, hoarseness, chest pain, syncope, dyspnea on exertion, peripheral edema, prolonged cough, headaches, hemoptysis, abdominal pain, melena, hematochezia, severe indigestion/heartburn, hematuria, genital sores, muscle weakness, transient blindness, difficulty walking, depression, unusual weight change, enlarged lymph nodes, angioedema, and breast masses.    Physical Exam  General:  Well-developed,well-nourished,in no acute distress; alert,appropriate and cooperative throughout examination Head:  Normocephalic and atraumatic without obvious abnormalities. No apparent  alopecia or balding. Eyes:  PERRL, EOMs full, conjunctiva clear glasses  Ears:  R ear normal, L ear normal, and no external deformities.   Nose:  no external deformity, no external erythema, and no nasal discharge.   Mouth:  good dentition and pharynx pink and moist.   Neck:  No deformities, masses, or tenderness noted. Chest Wall:  No deformities, masses, or tenderness noted. Breasts:  No mass, nodules, thickening, tenderness, bulging, retraction, inflamation, nipple discharge or skin changes noted.   Lungs:  Normal respiratory effort, chest expands symmetrically. Lungs are clear to auscultation, no crackles or wheezes.no dullness.   Heart:  Normal rate and regular rhythm. S1 and S2 normal without gallop, murmur, click, rub or other extra sounds.no lifts.   Abdomen:  Bowel sounds positive,abdomen soft and non-tender without masses, organomegaly or hernias noted. healing lap surgical scars  nontender  Rectal:  deferd done per gi and surgery Genitalia:  pap not indicated hysterectomy Msk:  no joint swelling, no joint warmth, and no redness over joints.   Pulses:  pulses intact without delay   Extremities:  no clubbing cyanosis or edema  Neurologic:  alert & oriented X3, strength normal in all extremities, and gait normal.  non focal  Pt is A&Ox3,affect,speech,memory,attention,&motor skills appear intact.  Skin:  turgor normal, color normal, no ecchymoses, and no petechiae.   Cervical Nodes:  No lymphadenopathy noted Axillary Nodes:  No palpable lymphadenopathy Inguinal Nodes:  No significant adenopathy Psych:  Oriented X3, normally interactive, good eye contact, not anxious appearing, and not depressed appearing.   records from hospital reviewed  Impression & Recommendations:  Problem # 1:  PREVENTIVE HEALTH CARE (ICD-V70.0) Discussed nutrition,exercise,diet,healthy weight, vitamin D and calcium.  pneumovax and tdap today Orders: TLB-Lipid Panel (80061-LIPID) TLB-BMP (Basic Metabolic  Panel-BMET) (80048-METABOL) TLB-CBC Platelet - w/Differential (85025-CBCD) TLB-Hepatic/Liver Function Pnl (80076-HEPATIC) TLB-TSH (Thyroid Stimulating Hormone) (84443-TSH) Venipuncture (16109) Specimen Handling (60454)  Problem # 2:  HYPERLIPIDEMIA (ICD-272.2)  Her updated medication list for this problem includes:    Simvastatin 40 Mg Tabs (Simvastatin) .Marland Kitchen... 1 by mouth once daily  Orders: TLB-Lipid Panel (80061-LIPID) TLB-BMP (Basic Metabolic Panel-BMET) (80048-METABOL) TLB-CBC Platelet - w/Differential (85025-CBCD) TLB-Hepatic/Liver Function Pnl (80076-HEPATIC) TLB-TSH (Thyroid Stimulating Hormone) (84443-TSH) Venipuncture (09811) Specimen Handling (91478)  Labs Reviewed: SGOT: 26 (09/01/2008)   SGPT: 20 (09/01/2008)  Prior 10 Yr Risk Heart Disease: 7 % (06/12/2009)   HDL:64.40 (09/01/2008), 70.0 (01/06/2007)  LDL:106 (09/01/2008), DEL (01/06/2007)  Chol:193 (09/01/2008), 205 (01/06/2007)  Trig:113.0 (09/01/2008), 128 (01/06/2007)  Problem # 3:  HYPERTENSION (ICD-401.9) Assessment: Unchanged  Her updated medication list for this problem includes:    Losartan Potassium-hctz 50-12.5 Mg Tabs (Losartan potassium-hctz) .Marland Kitchen... 1 by mouth once daily  Orders: TLB-Lipid Panel (80061-LIPID) TLB-BMP (Basic Metabolic Panel-BMET) (80048-METABOL) TLB-CBC Platelet - w/Differential (85025-CBCD) TLB-Hepatic/Liver Function Pnl (80076-HEPATIC) TLB-TSH (Thyroid Stimulating Hormone) (84443-TSH) Venipuncture (29562) Specimen Handling (13086)  BP today: 130/80 Prior BP: 130/80 (06/12/2009)  Prior 10 Yr Risk Heart Disease: 7 % (06/12/2009)  Labs Reviewed: K+: 4.8 (01/12/2009) Creat: : 1.3 (01/12/2009)   Chol: 193 (09/01/2008)   HDL: 64.40 (09/01/2008)  LDL: 106 (09/01/2008)   TG: 113.0 (09/01/2008)  Problem # 4:  BENIGN NEOPLASM OF COLON (ICD-211.3) stable to follow up with GI    Complete Medication List: 1)  Prilosec Otc 20 Mg Tbec (Omeprazole magnesium) 2)  Vitamin D 1000 Mg  3)   Simvastatin 40 Mg Tabs (Simvastatin) .Marland Kitchen.. 1 by mouth once daily 4)  Hydrocodone-acetaminophen 7.5-325 Mg Tabs (Hydrocodone-acetaminophen) .... Take 1-2 tabs as needed. 5)  Cyclobenzaprine Hcl 10 Mg Tabs (Cyclobenzaprine hcl) .Marland Kitchen.. 1 by mouth three times a day prn back spasms 6)  Losartan Potassium-hctz 50-12.5 Mg Tabs (Losartan potassium-hctz) .Marland Kitchen.. 1 by mouth once daily 7)  Vitamin C 500 Mg Chew (Ascorbic acid)  Other Orders: TLB-A1C / Hgb A1C (Glycohemoglobin) (83036-A1C) Tdap => 90yrs IM (29562) Admin 1st Vaccine (13086) Pneumococcal Vaccine (57846) Admin of Any Addtl Vaccine (96295)  Contraindications/Deferment of Procedures/Staging:    Test/Procedure: PAP Smear    Reason for deferment: hysterectomy   Patient Instructions: 1)  You will be informed of lab results when available.  2)  No change in medication at present.  3)  return office visit in 6 months or as needed  4)  get flu shot when available.    Immunizations Administered:  Tetanus Vaccine:    Vaccine Type: Tdap    Site: left deltoid    Mfr: GlaxoSmithKline    Dose: 0.5 ml    Route: IM    Given by: Romualdo Bolk, CMA (AAMA)    Exp. Date: 12/15/2011    Lot #: [id number]  Pneumonia Vaccine:    Vaccine Type: Pneumovax (Medicare)    Site: right deltoid    Mfr: Merck    Dose: 0.5 ml    Route: IM    Given by: Romualdo Bolk, CMA (AAMA)    Exp. Date: 02/24/2011    Lot #: [id number]

## 2010-03-27 NOTE — Letter (Signed)
Summary: Patient Notice- Polyp Results  [facility]  [address] [place], [place] [address]   Phone: [phone]  Fax: [phone]        Jul 20, 2009 MRN: [id number]    Cynthia Estrada [address] [place], [place]  [address]    Dear [name],  I am pleased to inform you that the colon polyps removed during your recent colonoscopy were found to be benign (no cancer detected) upon pathologic examination. however, as you know, they were not removed and could still contain cancer. Be sure to see Dr. Michaell Cowing, the surgeon, on June 1 as has been discussed.  I have attempted to reach you by phone about this pathology but have so far been unsuccessful. I will try again and we may have spoken by the time you receive this letter.  I recommend you have a repeat colonoscopy examination in 1 year to look for recurrent polyps, as having colon polyps increases your risk for having recurrent polyps or even colon cancer in the future.  Should you develop new or worsening symptoms of abdominal pain, bowel habit changes or bleeding from the rectum or bowels, please schedule an evaluation with either your primary care physician or with me.   Sincerely,  Iva Boop MD, [facility]  This letter has been electronically signed by your physician.  Appended Document: Patient Notice- Polyp Results letter mailed.

## 2010-03-27 NOTE — Letter (Signed)
Summary: [facility]   Imported By: Maryln Gottron 09/20/2009 14:45:01  _____________________________________________________________________  External Attachment:    Type:   Image     Comment:   External Document

## 2010-04-16 ENCOUNTER — Ambulatory Visit (INDEPENDENT_AMBULATORY_CARE_PROVIDER_SITE_OTHER): Payer: Medicare Other | Admitting: Internal Medicine

## 2010-04-16 ENCOUNTER — Encounter: Payer: Self-pay | Admitting: Internal Medicine

## 2010-04-16 DIAGNOSIS — I10 Essential (primary) hypertension: Secondary | ICD-10-CM

## 2010-04-16 DIAGNOSIS — K219 Gastro-esophageal reflux disease without esophagitis: Secondary | ICD-10-CM

## 2010-04-16 DIAGNOSIS — M1990 Unspecified osteoarthritis, unspecified site: Secondary | ICD-10-CM

## 2010-04-16 MED ORDER — OMEPRAZOLE 20 MG PO CPDR: 20.0000 mg | DELAYED_RELEASE_CAPSULE | Freq: Every day | ORAL | Status: DC

## 2010-04-16 NOTE — Progress Notes (Signed)
  Subjective:    Patient ID: ARITZA BRUNET, female    DOB: 12/29/1945, 65 y.o.   MRN: [id number]  HPI  Dennie Bible comesin today for follow up of  multiple medical issues  No major change in health status since last visit .  HT: no se of meds and no exercise intolerance only limited by her arhtritis issues. No cp sob or edema. OA takes pain med prn . Couple times a week.at most. GERD: taking tc med with help but gets sx when not     Review of Systems Neg cp sob  Change in vision or hearing   Gi as above Gu as above.    Objective:   Physical Exam WD WN in nad   Appears well .HEENT: Normocephalic ;atraumatic , Eyes;  PERRL, EOMs  Full, lids and conjunctiva clear,,Ears: no deformities, canals nl, TM landmarks normal, Nose: no deformity or discharge  Mouth : OP clear without lesion or edema . Neck no masses no bruits CV s1 s1 no g o m  Chest cta bs =  extrem No clubbing cyanosis or edema  Neuro grossly non focal        Assessment & Plan:  HT : stable   Continue meds and moitoring GERD : disc      rx med     OA :  Call for refill  meds   Exercise as tolerated

## 2010-04-16 NOTE — Patient Instructions (Signed)
Continue lifestyle intervention healthy eating and exercise .   And medications Bring blood pressure  machine to next  Visit ( yearly visit) call in the meantime if needed.

## 2010-04-16 NOTE — Assessment & Plan Note (Signed)
Controlled  Readings improve in visit and no sig cv pulm sx .   Continue lifestyle intervention healthy eating and exercise .  And check up in 6 months .

## 2010-04-29 ENCOUNTER — Other Ambulatory Visit: Payer: Self-pay | Admitting: Internal Medicine

## 2010-05-06 ENCOUNTER — Other Ambulatory Visit: Payer: Self-pay | Admitting: Internal Medicine

## 2010-05-11 ENCOUNTER — Ambulatory Visit (INDEPENDENT_AMBULATORY_CARE_PROVIDER_SITE_OTHER)
Admission: RE | Admit: 2010-05-11 | Discharge: 2010-05-11 | Disposition: A | Discharge status: Home / Self Care | Payer: Medicare Other | Source: Ambulatory Visit | Attending: Internal Medicine | Admitting: Internal Medicine

## 2010-05-11 ENCOUNTER — Ambulatory Visit (INDEPENDENT_AMBULATORY_CARE_PROVIDER_SITE_OTHER): Payer: Medicare Other | Admitting: Internal Medicine

## 2010-05-11 VITALS — BP 140/80 | HR 64 | Temp 98.7°F | Resp 12 | Wt 190.0 lb

## 2010-05-11 DIAGNOSIS — K219 Gastro-esophageal reflux disease without esophagitis: Secondary | ICD-10-CM

## 2010-05-11 DIAGNOSIS — R0789 Other chest pain: Secondary | ICD-10-CM

## 2010-05-11 DIAGNOSIS — R059 Cough, unspecified: Secondary | ICD-10-CM

## 2010-05-11 DIAGNOSIS — I10 Essential (primary) hypertension: Secondary | ICD-10-CM

## 2010-05-11 DIAGNOSIS — R05 Cough: Secondary | ICD-10-CM

## 2010-05-11 DIAGNOSIS — E782 Mixed hyperlipidemia: Secondary | ICD-10-CM

## 2010-05-11 LAB — GLUCOSE, POCT (MANUAL RESULT ENTRY): POC Glucose: 98

## 2010-05-11 MED ORDER — DEXLANSOPRAZOLE 60 MG PO CPDR: 60.0000 mg | DELAYED_RELEASE_CAPSULE | Freq: Every day | ORAL | Status: DC

## 2010-05-11 NOTE — Patient Instructions (Addendum)
This acts like reflux  but i cant explain  the fatigue  Stop all cold medications  Can try different acid reflux  medication 1 per day. Get a chest x ray   ( order has been placed )  Muscle relaxers and pain  med can add to fatigue .  Take only if needed. Will review your record and refer to cardiology about cause of chest discomfort . return office visit in 3 weeks or prn

## 2010-05-12 ENCOUNTER — Encounter: Payer: Self-pay | Admitting: Internal Medicine

## 2010-05-12 NOTE — Progress Notes (Signed)
  Subjective:    Patient ID: Cynthia Estrada, female    DOB: 17-Nov-1945, 65 y.o.   MRN: [id number]  HPI Patient comes in today because of the 10 day history of some chest discomfort feeling like a fullness in her mid chest and tightness that is now gone up into her throat area she then developed a dry cough that is not severe no specific shortness of breath or change in exercise tolerance although she feels tired and just not right. She is taken Advil cold without any help. She feels somewhat weak all over without associated fever or chills. She's been taking her prowess second daily. Sometimes she feels the discomfort under her left breast. The discomfort is Persistentwith waxing and waning  Review of Systems No nausea vomiting diarrhea leg swelling and bruising or falling. She has some hot flushes as usual she thinks her blood pressure has been okay and has no history of wheezing or asthma. Nose is minimally congested unsure if she has allergies    Objective:   Physical Exam Well developed well-nourished in no acute distress vital signs are stable. HEENT: Normocephalic ;atraumatic , Eyes;  PERRL, EOMs  Full, lids and conjunctiva clear,,Ears: no deformities, canals nl, TM landmarks normal, Nose: no deformity or discharge  Mouth : OP clear without lesion or edema . Neck : no masses thyroid is easily palpable Chest:  Clear to A&P without wheezes rales or rhonchi CV:  S1-S2 no gallops or murmurs peripheral perfusion is normal Abdomen:  Sof,t normal bowel sounds without hepatosplenomegaly, no guarding rebound or masses no CVA tenderness  Skin is clear without petechiae or bruising Neurologic is intact grossly with no obvious focal deficits Psych Oriented x 3 and no noted deficits in memory, attention, and speech.     EKG NSR nonspecific t wave change in septal lead  But no change from pastt EKGs compared . Hg and bg is normal  Assessment & Plan:  Atypical chest discomfort suspect reflux as a  clause however I cannot explain her fatigue and weakness feeling. As possible from medications but she isn't taking anything but the cold medicine. There is no evidence of acute cardiovascular pulmonary compromise. We'll get it chest x-ray intensified her reflux medication and plan on a cardiology consult. I suppose she could have some type of asthma but has had no history that in the past. Alarm features discussed to seek help in the meantime.

## 2010-05-12 NOTE — Assessment & Plan Note (Signed)
Change medication to dexilantsamples given to take one a day until she comes back.

## 2010-05-13 LAB — CBC
Platelets: 152 10*3/uL (ref 150–400)
RBC: 3.87 MIL/uL (ref 3.87–5.11)
WBC: 9.7 10*3/uL (ref 4.0–10.5)

## 2010-05-13 LAB — BASIC METABOLIC PANEL
BUN: 11 mg/dL (ref 6–23)
Calcium: 9.4 mg/dL (ref 8.4–10.5)
GFR calc non Af Amer: 44 mL/min — ABNORMAL LOW (ref >60)
Potassium: 4 mEq/L (ref 3.5–5.1)

## 2010-05-13 LAB — CREATININE, SERUM
Creatinine, Ser: 1.26 mg/dL — ABNORMAL HIGH (ref 0.4–1.2)
GFR calc non Af Amer: 43 mL/min — ABNORMAL LOW (ref >60)
GFR calc non Af Amer: 47 mL/min — ABNORMAL LOW (ref >60)

## 2010-05-13 LAB — POTASSIUM: Potassium: 3.5 mEq/L (ref 3.5–5.1)

## 2010-05-13 LAB — SURGICAL PCR SCREEN
MRSA, PCR: NEGATIVE
Staphylococcus aureus: NEGATIVE

## 2010-05-14 ENCOUNTER — Telehealth: Payer: Self-pay | Admitting: *Deleted

## 2010-05-14 ENCOUNTER — Other Ambulatory Visit (INDEPENDENT_AMBULATORY_CARE_PROVIDER_SITE_OTHER): Payer: Medicare Other | Admitting: Internal Medicine

## 2010-05-14 DIAGNOSIS — R7309 Other abnormal glucose: Secondary | ICD-10-CM

## 2010-05-14 DIAGNOSIS — R9389 Abnormal findings on diagnostic imaging of other specified body structures: Secondary | ICD-10-CM

## 2010-05-14 LAB — BASIC METABOLIC PANEL
CO2: 28 mEq/L (ref 19–32)
Calcium: 9.5 mg/dL (ref 8.4–10.5)
GFR: 51.31 mL/min — ABNORMAL LOW (ref >60.00)
Sodium: 137 mEq/L (ref 135–145)

## 2010-05-14 NOTE — Telephone Encounter (Signed)
Message copied by Tor Netters on Mon May 14, 2010  1:23 PM ------      Message from: [facility] [place] [facility]      Created: Sun May 13, 2010 11:25 AM       Tell patient that chest x ray showed no reason for her SOB. But did see a single 1 cm pulmonary nodule  And radiology rec we get better look at that with a Chest CT scan.            (Please order this and will need to get  Bmp pre scan )

## 2010-05-16 ENCOUNTER — Other Ambulatory Visit: Payer: Self-pay | Admitting: Internal Medicine

## 2010-05-16 DIAGNOSIS — R0789 Other chest pain: Secondary | ICD-10-CM

## 2010-05-17 ENCOUNTER — Ambulatory Visit (INDEPENDENT_AMBULATORY_CARE_PROVIDER_SITE_OTHER)
Admission: RE | Admit: 2010-05-17 | Discharge: 2010-05-17 | Disposition: A | Discharge status: Home / Self Care | Payer: Medicare Other | Source: Ambulatory Visit | Attending: Internal Medicine | Admitting: Internal Medicine

## 2010-05-17 ENCOUNTER — Inpatient Hospital Stay (HOSPITAL_COMMUNITY): Admission: RE | Admit: 2010-05-17 | Payer: Medicare Other | Source: Ambulatory Visit

## 2010-05-17 DIAGNOSIS — R0789 Other chest pain: Secondary | ICD-10-CM

## 2010-05-17 MED ORDER — IOHEXOL 300 MG/ML  SOLN
80.0000 mL | Freq: Once | INTRAMUSCULAR | Status: AC | PRN
  Administered 2010-05-17: 80 mL via INTRAVENOUS

## 2010-05-18 NOTE — Progress Notes (Signed)
Pt aware of results 

## 2010-05-25 ENCOUNTER — Encounter: Payer: Self-pay | Admitting: Internal Medicine

## 2010-05-28 ENCOUNTER — Other Ambulatory Visit: Payer: Self-pay | Admitting: Internal Medicine

## 2010-05-28 NOTE — Telephone Encounter (Signed)
Per Dr. Fabian Sharp- ok x 1. Rx called into pharmacy

## 2010-06-04 ENCOUNTER — Ambulatory Visit (INDEPENDENT_AMBULATORY_CARE_PROVIDER_SITE_OTHER): Payer: Medicare Other | Admitting: Internal Medicine

## 2010-06-04 ENCOUNTER — Encounter: Payer: Self-pay | Admitting: Internal Medicine

## 2010-06-04 VITALS — BP 120/80 | HR 72 | Wt 193.0 lb

## 2010-06-04 DIAGNOSIS — E782 Mixed hyperlipidemia: Secondary | ICD-10-CM

## 2010-06-04 DIAGNOSIS — R7309 Other abnormal glucose: Secondary | ICD-10-CM

## 2010-06-04 DIAGNOSIS — K219 Gastro-esophageal reflux disease without esophagitis: Secondary | ICD-10-CM

## 2010-06-04 DIAGNOSIS — I10 Essential (primary) hypertension: Secondary | ICD-10-CM

## 2010-06-04 MED ORDER — DEXLANSOPRAZOLE 60 MG PO CPDR: 60.0000 mg | DELAYED_RELEASE_CAPSULE | Freq: Every day | ORAL | Status: DC

## 2010-06-04 NOTE — Progress Notes (Signed)
  Subjective:    Patient ID: Cynthia Estrada, female    DOB: May 05, 1945, 65 y.o.   MRN: [id number]  HPI Patient comes in today for followup of chest pressure and fatigue.  She states she may be somewhat better and that  Dxilant helps the fullness in her upper chest. Cough is intermittent minimal still has some fatigue pain and pressure it is middle to left chest. No nausea vomiting diarrhea or swelling. Chest pressure    Some better.  With the dexilant  BP stable  Denies known se of meds  .  Review of Systems Sob ok .  No history of obstructive sleep apnea or snoring. Rest no change .   And as per hpi   Past history family history social history reviewed in the electronic medical record.     Objective:   Physical Exam Well-developed well-nourished in no acute distress Neck palpable thyroid?  Chest:  Clear to A&P without wheezes rales or rhonchi CV:  S1-S2 no gallops or murmurs peripheral perfusion is normal  rate 60  No clubbing cyanosis or edema  Labs  Reviewed   Cr 1.3 up from 1.o Ct scan negative for  clot or pathology acute .       Assessment & Plan:  Chest discomfort pressure    possibly improved on Dexilantand could be from GERD he has cardiology appointment HT Creatinine slightly up will follow  GERD    dexcilant much better than prilosec.  unsurea bout insurance coverage  Samples and rx given and   . follow up in 6-8 weeks  Consider gi to see if needed  .   Total visit > 50% spent counseling and coordinating care

## 2010-06-04 NOTE — Patient Instructions (Signed)
Continue on same  dexilant . Keep cardiology appt.  ROV in  6-8 weeks or as needed.

## 2010-06-12 ENCOUNTER — Ambulatory Visit (INDEPENDENT_AMBULATORY_CARE_PROVIDER_SITE_OTHER): Payer: Medicare Other | Admitting: Cardiovascular Disease

## 2010-06-12 ENCOUNTER — Encounter: Payer: Self-pay | Admitting: Cardiovascular Disease

## 2010-06-12 DIAGNOSIS — I10 Essential (primary) hypertension: Secondary | ICD-10-CM

## 2010-06-12 DIAGNOSIS — E782 Mixed hyperlipidemia: Secondary | ICD-10-CM

## 2010-06-12 DIAGNOSIS — R079 Chest pain, unspecified: Secondary | ICD-10-CM | POA: Insufficient documentation

## 2010-06-12 NOTE — Assessment & Plan Note (Signed)
Atypical with gi overtones.  ECG normal .  F/U stress echo.  F/U Dr Leone Payor to consider EGD.  Continue Dexilant

## 2010-06-12 NOTE — Progress Notes (Signed)
66 yo patient of Dr Antoine Poche seen at the request of Dr Fabian Sharp for SSCP.  Last seen by [facility] in 2008 for syncope.  Reviewed ETT and echo.  Both normal with no further w/u.  Recent colectomy for large polyps 2011 not cardiac complications.  Persistant and worsening reflux/GERD with pain in chest.  Pain sounds gi in nature.  Not exertional.  Can be exacerbated by food.  No pnd, orthopnea, palpitaotins, edema or recurrent syncope.  Dr Leone Payor has seen her for gi in past but not since colectomy.   Now on dexilant for reflux.  CRF HTN and elevated lipids well controlled on meds for over 10 years.      ROS: Denies fever, malais, weight loss, blurry vision, decreased visual acuity, cough, sputum, SOB, hemoptysis, pleuritic pain, palpitaitons, heartburn, abdominal pain, melena, lower extremity edema, claudication, or rash.   General: Affect appropriate Healthy:  appears stated age HEENT: normal Neck supple with no adenopathy JVP normal no bruits no thyromegaly Lungs clear with no wheezing and good diaphragmatic motion Heart:  S1/S2 no murmur,rub, gallop or click PMI normal Abdomen: benighn, BS positve, no tenderness, no AAA no bruit.  No HSM or HJR S/P colectomy Distal pulses intact with no bruits No edema Neuro non-focal Skin warm and dry No muscular weakness  Medications Current Outpatient Prescriptions  Medication Sig Dispense Refill  . Ascorbic Acid (VITAMIN C) 500 MG tablet Take 500 mg by mouth daily.        Marland Kitchen aspirin 81 MG tablet Take 81 mg by mouth daily.        . cholecalciferol (VITAMIN D) 1000 UNIT tablet Take 1,000 Units by mouth daily.        . cyclobenzaprine (FLEXERIL) 10 MG tablet Take 10 mg by mouth 3 (three) times daily as needed.        Marland Kitchen dexlansoprazole (DEXILANT) 60 MG capsule Take 1 capsule (60 mg total) by mouth daily.  30 capsule  2  . HYDROcodone-acetaminophen (NORCO) 7.5-325 MG per tablet TAKE ONE TO TWO TABLETS BY MOUTH EVERY DAY AS NEEDED  40 tablet  0  .  losartan-hydrochlorothiazide (HYZAAR) 50-12.5 MG per tablet TAKE ONE TABLET BY MOUTH EVERY DAY  30 tablet  5  . omeprazole (PRILOSEC) 20 MG capsule Take 1 capsule (20 mg total) by mouth daily.  30 capsule  6  . simvastatin (ZOCOR) 40 MG tablet TAKE ONE TABLET BY MOUTH EVERY DAY  30 tablet  3    Allergies Lisinopril  Family History: Family History  Problem Relation Age of Onset  . Hypertension Mother   . Diabetes Mother   . Hypertension Sister   . Hypertension Brother   . Hyperlipidemia Brother     Social History: History   Social History  . Marital Status: Single    Spouse Name: N/A    Number of Children: N/A  . Years of Education: N/A   Occupational History  . Retire     [employer]   Social History Main Topics  . Smoking status: Never Smoker   . Smokeless tobacco: Not on file  . Alcohol Use: No  . Drug Use: No  . Sexually Active:    Other Topics Concern  . Not on file   Social History Narrative   HH of 1 No petsRetired from textile millsExercises walking  onlylimited by knee oa changes Working part time sitting [place]    Electrocardiogram:  05/11/10  NSR 63  normal  Assessment and Plan

## 2010-06-12 NOTE — Assessment & Plan Note (Signed)
Well controlled.  Continue current medications and low sodium Dash type diet.    

## 2010-06-12 NOTE — Assessment & Plan Note (Signed)
Cholesterol is at goal.  Continue current dose of statin and diet Rx.  No myalgias or side effects.  F/U  LFT's in 6 months. Lab Results  Component Value Date   LDLCALC 93 10/16/2009

## 2010-06-12 NOTE — Patient Instructions (Signed)
Your physician has requested that you have a stress echocardiogram. For further information please visit [email or web address]. Please follow instruction sheet as given.   

## 2010-06-22 ENCOUNTER — Other Ambulatory Visit (HOSPITAL_COMMUNITY): Payer: Medicare Other | Admitting: Radiology

## 2010-06-27 ENCOUNTER — Encounter: Payer: Self-pay | Admitting: Internal Medicine

## 2010-06-27 ENCOUNTER — Ambulatory Visit (AMBULATORY_SURGERY_CENTER): Payer: Medicare Other | Admitting: *Deleted

## 2010-06-27 VITALS — Ht 67.0 in | Wt 192.5 lb

## 2010-06-27 DIAGNOSIS — Z86010 Personal history of colonic polyps: Secondary | ICD-10-CM

## 2010-06-27 MED ORDER — PEG-KCL-NACL-NASULF-NA ASC-C 100 G PO SOLR: ORAL | Status: DC

## 2010-07-05 ENCOUNTER — Encounter: Payer: Self-pay | Admitting: Internal Medicine

## 2010-07-10 NOTE — Assessment & Plan Note (Signed)
[facility]                            CARDIOLOGY OFFICE NOTE   Cynthia Estrada, Cynthia Estrada                        MRN:          [id number]  DATE:02/17/2007                            DOB:          18-Apr-1945    PRIMARY CARE PHYSICIAN:  Dr. Berniece Andreas.   REASON FOR PRESENTATION:  Evaluate patient with syncope.   HISTORY OF PRESENT ILLNESS:  Patient was recently hospitalized on  February 08, 2007 after loss of consciousness.  She had been out and  feeling okay.  She walked up the stairs in her home, and felt her heart  racing.  She was able to change in to her pajamas.  She went in to the  bathroom.  When she stood up from the bathroom, she had a sudden loss of  consciousness.  She did fall and hit her head.  She remembers opening  her eyes and seeing someone standing over her.  The next thing she  recalls is being in the ambulance.  She was brought to [facility]  Emergency Room where blood pressure was okay.  She had normal heart  rate, and otherwise unremarkable exam.  I do not see orthostatics.  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  There were no other  significant abnormalities.   The patient did have a CT of her head as well, which demonstrated  subarachnoid hemorrhage of the right posterior frontal focus near the  vertex.  She was seen by Dr. Wynetta Emery, and told this was related to the  trauma of the fall, but did have a followup CT.  No further therapy  apparently was warranted.   The patient says she has otherwise been well.  She does not get  presyncope or syncope.  She does not have orthostatic symptoms.  She  will occasionally feel her heart palpating.  She may feel this when she  walks up a hill.  She has had a couple of these episodes at rest.  They  have not been associated with presyncope or syncope.  She has some  dyspnea walking up a hill, but does  not describe any PND or orthopnea.  She chronically sleeps on 2 pillows for comfort.  She denies chest  pressure, neck discomfort, arm discomfort, activity-induced nausea,  vomiting, or excessive diaphoresis.   PAST MEDICAL HISTORY:  1. Hypertension times years.  2. Hyperlipidemia times years.   PAST SURGICAL HISTORY:  Hysterectomy.   ALLERGIES:  NONE.   MEDICATIONS:  1. Lisinopril/hydrochlorothiazide 40/25 daily.  2. Diclofenac.  3. Gabapentin 300 mg daily.  4. Hydrocodone.  5. Simvastatin 40 mg daily.  6. Vitamin D.   SOCIAL HISTORY:  The patient is retired.  She is single.  She has never  smoked cigarettes, and does not drink alcohol other than an occasional  glass of wine.   FAMILY HISTORY:  Noncontributory for early coronary disease, though her  mother died of complications of diabetes and myocardial infarction at  age 64.  There has been no sudden death, syncope,  or heart failure in  the family.   REVIEW OF SYSTEMS:  As stated in the HPI, and negative for other  systems.   PHYSICAL EXAMINATION:  The patient is in no distress.  Blood pressure 138/72.  Heart rate 58 and regular.  Weight 201 pounds.  Body mass index 30.  HEENT:  Eyes unremarkable.  Pupils equal, round, and reactive to light.  Fundi not visualized. Oral mucosa unremarkable.  NECK:  No jugular venous distention at 45 degrees.  Carotid upstroke  brisk and symmetric.  No bruits.  No thyromegaly.  LYMPHATICS:  No cervical, axillary, or inguinal adenopathy.  LUNGS:  Clear to auscultation bilaterally.  BACK:  No costovertebral angle tenderness.  CHEST:  Unremarkable.  HEART:  PMI not displaced or sustained.  S1 and S2 within normal limits.  No S3.  No S4.  No clicks.  No rubs.  No murmurs.  ABDOMEN:  Flat.  Positive bowel sounds.  Normal in frequency and pitch.  No bruits.  No rebound.  No guarding.  No midline pulsatile mass.  No  hepatomegaly.  No splenomegaly.  SKIN:  No rashes.  No nodules.   EXTREMITIES:  Two plus pulses throughout.  No edema.  No cyanosis.  No  clubbing.  NEUROLOGIC:  Oriented to person, place, and time.  Cranial nerves 2  through 12 grossly intact.  Motor grossly intact.  EKG:  Sinus bradycardia.  Rate 58.  Right axis deviation.  No acute ST-T  wave changes.   ASSESSMENT AND PLAN:  1. Syncope.  The patient had a syncopal episode, and did have some      palpitations.  Today, her only objective finding is a very slight      bradycardia.  She has not had any further presyncopal episodes, and      has had no syncope.  She is not having any chest discomfort, though      she has had dyspnea with exertion.  The echocardiogram demonstrates      no significant abnormalities.  I am going to apply a 48-hour Holter      monitor to further evaluate for any tachy or brady arrhythmias.      Given her dyspnea and these events, she will be screened with an      exercise perfusion study to rule out coronary disease.  2. Hypertension.  Blood pressure is well controlled.  No further      cardiovascular testing or change in medications is suggested.  3. Dyslipidemia.  Per Dr. Fabian Sharp.  She remains on Simvastatin.  4. Subarachnoid hemorrhage.  Per Dr. Fabian Sharp.  She was apparently      cleared by the      neurosurgeons while hospitalized.  5. Followup.  I will see her back based on the results of the above,      or based on symptoms.     Rollene Rotunda, MD, [facility]  Electronically Signed    JH/MedQ  DD: 02/17/2007  DT: 02/17/2007  Job #: [id number]   cc:   Neta Mends. Fabian Sharp, MD

## 2010-07-10 NOTE — H&P (Signed)
NAME:  Cynthia Estrada, Cynthia Estrada NO.:  [id number]   MEDICAL RECORD NO.:  [id number]          PATIENT TYPE:  INP   LOCATION:  3027                         FACILITY:  [facility]   PHYSICIAN:  Donalee Citrin, M.D.        DATE OF BIRTH:  1945-05-08   DATE OF ADMISSION:  02/08/2007  DATE OF DISCHARGE:                              HISTORY & PHYSICAL   ADMISSION DIAGNOSIS:  Traumatic subarachnoid hemorrhage, closed head  injury following a fall from syncopal episode.   HISTORY OF PRESENT ILLNESS:  The patient is 65 year old female with past  history is significant for hypertension, hypercholesterolemia and gout  who reports having a syncopal episode and fainting after defecating.  She does not remember anything after that, however, was found on the  floor by family.  The patient said that the last 3 days she has had king  of a funny weak feeling in her chest and overall has not been feeling  well.  She got up and went to the bathroom and after she was having a  bowel movement fainted.  She does not know how long she was out.  She  does not describe any chest pain associated with no radiation.  She  denies any shortness of breath, any nausea or vomiting.   PAST MEDICAL HISTORY:  1. Gout.  2. Hypercholesterolemia.  3. Hypertension.   PAST SURGICAL HISTORY:  Hysterectomy.   ALLERGIES:  No known drug allergies.   CURRENT MEDICATIONS:  Lisinopril, simvastatin, Gabapentin, diclofenac  and Vicodin.   PHYSICAL EXAMINATION:  GENERAL:  The patient is an awake, alert,  oriented, 65 year old female in no distress.  HEENT:  Within normal limits.  Pupils equal round and reactive to light.  Extraocular movements intact.  NEUROLOGIC:  Cranial nerves are otherwise intact.  Strength is 5/5 in  upper and lower extremities with no sign of dysmetria or pronator drift.  Reflexes and sensation normal and symmetric.   LABORATORY DATA AND X-RAY FINDINGS:  In the ED, blood work was sent with  sodium 138,  potassium 4.2, chloride 170, BUN 25, glucose of 175,  creatinine 1.4.  Cardiac enzymes were also sent.  They were negative  with troponin levels less than 0.05.  CBC showed a white count of 7,  hematocrit of 35.7 with a hemoglobin of 12, platelet count of 196.  ETOH  level was less than 5.   CT scan shows a small amount of what appears to be traumatic  subarachnoid hemorrhage in the sulcus within the parietal lobe without  mass effect.  No sign of sternal subarachnoid hemorrhage and no sign of  hydrocephalus.  There may be a small hint of a subdural called by the  radiologist, although this is subtle and certainly is not causing any  mass effect.   ASSESSMENT/PLAN:  I have recommended that we admit the patient for  observation.  Will obtain a CT scan in about 6 hours.  The patient is  followed by Dr. Berniece Andreas with [facility].  I have recommended [facility]  come  see the patient in the morning on  morning rounds to evaluate for the  chest weakness and workup of syncope.  The patient did have an EKG on  admission that was negative and cardiac enzymes appear to be negative.  Otherwise, will place the patient in a monitor bed for serial neurologic  checks.           ______________________________  Donalee Citrin, M.D.     GC/MEDQ  D:  02/08/2007  T:  02/09/2007  Job:  [id number]

## 2010-07-10 NOTE — Consult Note (Signed)
NAME:  Cynthia Estrada, Cynthia Estrada NO.:  [id number]   MEDICAL RECORD NO.:  [id number]          PATIENT TYPE:  INP   LOCATION:  3027                         FACILITY:  [facility]   PHYSICIAN:  Cheron Every, MD   DATE OF BIRTH:  August 09, 1945   DATE OF CONSULTATION:  DATE OF DISCHARGE:  02/08/2007                                 CONSULTATION   PRIMARY CARE PHYSICIAN:  DR. PANOSH   HISTORY OF PRESENT ILLNESS:  Ms. Osborne is a 65 year old African-  American female with a history of hypertension and hyperlipidemia that  has been well controlled, who had a syncopal event this past evening  while defecating.  She did have loss of consciousness.  Her family was  in the house and heard her fall.  She awakened as EMS was arriving.  She  states that she has been having approximately 3 months of what she  describes as chest weakness associated with shortness of breath that is  worse with exertion and relieved with by rest.  She denies any chest  pain or chest pressure and denies any radiation to the back.  Acutely  her weakness has been worse the last few weeks.  She denies any  palpitations.  No premature coronary artery disease in her family  history.  Denies ever using any tobacco.  She did sustain a small  subarachnoid hemorrhage, likely secondary to trauma.   PAST MEDICAL HISTORY:  Significant for hypertension and hyperlipidemia  status post hysterectomy.   MEDICATIONS:  1. Lisinopril/hydrochlorothiazide 20/12.5 daily.  2. Simvastatin 40 mg daily.  3. Neurontin 300 b.i.d.  4. Diclofenac 75 daily.  5. Vicodin 5/325  p.r.n.   ALLERGIES:  NO KNOWN DRUG ALLERGIES.   FAMILY HISTORY:  Significant for hypertension and diabetes in her  mother.  Another family member has history of esophageal stricture  requiring dilatation.   REVIEW OF SYSTEMS:  All systems reviewed and negative except previously  stated.   SOCIAL HISTORY:  The patient denied any tobacco or alcohol use.  She is  retired from working in the [employer].  She is independent of all  her ADL's, and IADL's.   PHYSICAL EXAMINATION:  VITAL SIGNS:  Temperature 98.  Blood pressure  111/67.  Heart rate 60, sating 100% on room air.  GENERAL:  Alert and oriented x3, in no apparent distress.  HEENT:  Normocephalic, atraumatic.  Oropharynx is clear with moist mucus  membranes.  CARDIOVASCULAR:  Regular rate and rhythm, 2/6 systolic ejection murmur.  No carotid bruits.  CHEST:  Clear to auscultation bilaterally.  ABDOMEN:  Soft, nontender, nondistended.  EXTREMITIES:  Without edema.  NEUROLOGICAL:  She has no focal deficits.   LABORATORY:  Remarkable for BUN and creatinine of 25 and 1.4.  Unclear  what her baseline is, but her last creatinine was 1 in October of 2007.  EKG shows normal sinus rhythm without signs of ischemia.  CT of her head  shows subarachnoid hemorrhage of the right posterior frontal focus near  the vertex, in question tentorial blood.   IMPRESSION AND PLAN:  1. Syncope.  This  does appear to be secondary to vasovagal, but given      her description of this worsening chest weakness that is worse with      exertion, would rule out a myocardial infarction with serial      cardiac enzymes.  I suspect these will be normal.  Agree with      continuing telemetry.  Will check a transthoracic echocardiogram      given the murmur on this exam.  Again, this is most likely      vasovagal, but will rule out other etiology.  2. Hypertension.  Agree with the current management of her lisinopril      and hydrochlorothiazide.  3. Renal insufficiency.  Her creatinine is 1.5 today.  I suspect this      is possibly her baseline.  She does not appear to be dehydrated.      Her last creatinine was 1 in October of 2007.  Would follow and      check daily labs.  She might need to hold her hydrochlorothiazide      and ACE inhibitor if it continues to elevate.  4. Hyperlipidemia.  Continue with her statin.   Will check a fasting      lipid profile.  5. Subarachnoid hemorrhage per neurosurgery.   Thank you for this consult on this pleasant patient.  Will continue to  follow with you.      Cheron Every, MD  Electronically Signed     MLW/MEDQ  D:  02/08/2007  T:  02/09/2007  Job:  [id number]

## 2010-07-11 ENCOUNTER — Encounter: Payer: Self-pay | Admitting: Internal Medicine

## 2010-07-11 ENCOUNTER — Ambulatory Visit (AMBULATORY_SURGERY_CENTER): Payer: Medicare Other | Admitting: Internal Medicine

## 2010-07-11 VITALS — BP 137/78 | HR 45 | Temp 98.0°F | Resp 15 | Ht 67.0 in | Wt 190.0 lb

## 2010-07-11 DIAGNOSIS — Z860100 Personal history of colon polyps, unspecified: Secondary | ICD-10-CM

## 2010-07-11 DIAGNOSIS — D126 Benign neoplasm of colon, unspecified: Secondary | ICD-10-CM

## 2010-07-11 DIAGNOSIS — Z1211 Encounter for screening for malignant neoplasm of colon: Secondary | ICD-10-CM

## 2010-07-11 HISTORY — PX: COLONOSCOPY W/ POLYPECTOMY: SHX1380

## 2010-07-11 MED ORDER — SODIUM CHLORIDE 0.9 % IV SOLN: 500.0000 mL | INTRAVENOUS | Status: DC

## 2010-07-11 NOTE — Patient Instructions (Signed)
PT. AND CAREPARTNER GIVEN INSTRUCTIONS ON SAFETY PRECAUTIONS DUE TO SEDATING MEDICATIONS GIVEN TODAY AND RECOMMENDED DIET FOR TODAY AND INFORMATION ON POLYPS OF THE COLON GIVEN TO & REVIEWED WITH CAREPARTNER. RECALL COLONOSCOPY DATE WILL DEPEND ON PATHOLOGY RESULTS ON POLYPS REMOVED. PT. WILL RECEIVE A LETTER FROM DR. GESSNER WITH RESULTS OF PATHOLOGY AND RECALL DATE RECOMMENDED BY DR Leone Payor.

## 2010-07-11 NOTE — Progress Notes (Signed)
Pt's heart rate 45 on admission in the procedure room.  I asked her if her heart rate was normally that low.  Per pt, "I don't know what it runs."  Dr. Leone Payor was made aware of this.  He asked what her b/p was..I advised him it was 137/78.  He had no other orders. MAW   Pt's heart rate did not go any lower than 40 during the colonoscopy.  B/P stable. MAW

## 2010-07-12 ENCOUNTER — Telehealth: Payer: Self-pay | Admitting: *Deleted

## 2010-07-12 NOTE — Telephone Encounter (Signed)
Follow up Call- Patient questions:  Do you have a fever, pain , or abdominal swelling? no Pain Score  0 *  Have you tolerated food without any problems? no  Have you been able to return to your normal activities? yes  Do you have any questions about your discharge instructions: Diet   no Medications  no Follow up visit  no  Do you have questions or concerns about your Care? no  Actions: * If pain score is 4 or above: No action needed, pain <4. Patient without any problems with foods following colonoscopy.

## 2010-07-13 NOTE — Assessment & Plan Note (Signed)
[facility]                              [place] OFFICE NOTE   Cynthia Estrada, Cynthia Estrada                        MRN:          [id number]  DATE:11/29/2005                            DOB:          03-28-1945    CHIEF COMPLAINT:  New patient to establish, needs labs.   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  She comes in today for first time visit.  Her previous doctor was  Dr. Selina Cooley in [place] area.  She has moved recently to live with her  sister because of security issues of her home in [place].  She has history  of hypertension on medicine for at least five years, hyperlipidemia for  which she was on Lipitor in the past, but had side effects with body aches  and recently Crestor 10 mg.  However, she stopped medicine last two months  because of cost considerations and she could not afford the medication.  She  also has history of arthritis, presumed osteoarthritis, which has been the  most problematic where she gets stiffness in the morning, and knee pain and  hip pain, among other things.  She is on Diclofenac and gabapentin for that.  In the past, she was given a trial of prednisone and felt the best when she  was on that.  She is also on hormone replacement therapy Estradiol.   PAST MEDICAL HISTORY:  1. See database.  2. Except as above, she did have chickenpox as a child.  3. Hysterectomy for fibroid tumors, ovaries were left in.  No pregnancy.  4. Last Pap 2006.  5. Last menstrual period when she was 25.  6. Mammogram August 2006.  7. Never had a Pneumovax, I believe.  8. She did have a colonoscopy 3-4 years ago.  9. Tetanus shot is up to date.   FAMILY HISTORY:  Mom had arthritis, hypertension and type 2 diabetes.  Otherwise, negative.   SOCIAL HISTORY:  As above.  Works in Designer, fashion/clothing.  Walks, is on her feet most  of the time, 8 hours a day 5 days a week.  One glass of wine occasionally.  Household of three with her sister, brother-in-law, two pet dogs.  Hours of  sleep:  Four to six hours.   REVIEW OF SYSTEMS:  Negative for chest pain, shortness of breath,  significant edema.  Change in vision.   MEDICATIONS:  1. Estradiol 0.5.  2. Diclofenac 75 mg bid  3. Lisinopril 20 mg daily.  4. Gabapentin which is written for 300 mg three b.i.d. if she is taking,      will adjust her dose at night because of drowsiness.   ALLERGIES:  None recorded.   OBJECTIVE:  VITAL SIGNS:  Height 5 feet 5-1/4 inches, weight 211, pulse 60  and regular, blood pressure 120/80.  GENERAL:  WDWN, healthy appearing lady in no acute distress.  HEENT:  Grossly normal.  NECK:  Supple without mass, thyromegaly or bruits.  CHEST:  CTA.  BS equal.  CARDIAC:  S1, S2.  No gallops or murmurs.  Peripheral pulses present without  delay.  Negative CCE.  ABDOMEN:  Soft, without organomegaly, guarding or rebound.  She is slow to  get up on the table and get down, but otherwise, her gait is okay.  There  are no acute joint swellings at present, and no unusual skin lesions on  gross exam.  NEUROLOGICAL:  Appears to be nonfocal.   IMPRESSION:  1. Hypertension.  2. Hyperlipidemia with problems with cough with her medications.  3. Arthritis, appears to be the most problematic for her lifestyle at      present.  4. Hormone replacement therapy.   PLAN:  I refilled all her medications x6 months. I will get fasting labs of  lipid, OT/PT, BMP, CBC, TSH, sed rate, ANA and rheumatoid factor and them  plan on follow up in about one month.            ______________________________  Neta Mends. Fabian Sharp, MD     WKP/MedQ  DD:  11/29/2005  DT:  12/02/2005  Job #:  [id number]

## 2010-07-17 ENCOUNTER — Encounter: Payer: Self-pay | Admitting: Internal Medicine

## 2010-07-17 NOTE — Progress Notes (Signed)
Quick Note:  Two adenomas Hx large TV adenomas 2011 with right hemicolectomy Repeat colonoscopy routine in May 2015 ______

## 2010-07-30 ENCOUNTER — Encounter: Payer: Self-pay | Admitting: Internal Medicine

## 2010-07-30 ENCOUNTER — Ambulatory Visit (INDEPENDENT_AMBULATORY_CARE_PROVIDER_SITE_OTHER): Payer: Medicare Other | Admitting: Internal Medicine

## 2010-07-30 ENCOUNTER — Telehealth: Payer: Self-pay | Admitting: *Deleted

## 2010-07-30 DIAGNOSIS — I10 Essential (primary) hypertension: Secondary | ICD-10-CM

## 2010-07-30 DIAGNOSIS — M129 Arthropathy, unspecified: Secondary | ICD-10-CM

## 2010-07-30 DIAGNOSIS — Z86010 Personal history of colonic polyps: Secondary | ICD-10-CM

## 2010-07-30 DIAGNOSIS — K219 Gastro-esophageal reflux disease without esophagitis: Secondary | ICD-10-CM

## 2010-07-30 DIAGNOSIS — R079 Chest pain, unspecified: Secondary | ICD-10-CM

## 2010-07-30 NOTE — Telephone Encounter (Signed)
Pt aware of appt.

## 2010-07-30 NOTE — Progress Notes (Signed)
  Subjective:    Patient ID: Cynthia Estrada, female    DOB: 04-May-1945, 65 y.o.   MRN: [id number]  HPI Patient comes in today for follow up of med and atypical chest pain. Since her last visit she has seen Cardiology Dr Eden Emms who agress pain prob NCCP.    Stress echo had been scheduled but she had to cancel it because of the conflict. She has not rescheduled this because she was told to probably wasn't her heart.  Also had routine regularly scheduled followup colonoscopy showing dysplastic polyps.  However she has not seen gastroenterology regarding the chest pain. She apparently had bradycardia during the procedure in the 40s.     BP has generally been okay. No problems taking medication. CP has continued to take  Dexilant. Better but only about 10%  Worse after stress and sometimes eatind different. She generally feels better in this regard. OA: Joints still ache at times is only taking as needed Vicodin sometimes it makes her feel bad when her joints hurt  Review of Systems Negative for shortness of breath cough of fever weight loss change in bowel habits. She continues to work 5 hours a day. Past history family history social history reviewed in the electronic medical record.     Objective:   Physical Exam WDWN in nad  Neck supple without masses Chest:  Clear to A&P without wheezes rales or rhonchi CV:  S1-S2 no gallops or murmurs peripheral perfusion is normal Abdomen:  Sof,t normal bowel sounds without hepatosplenomegaly, no guarding rebound or masses no CVA tenderness Ext no acute redness swelling unusual rashes bruising or bleeding.  Review EMR cardiology consult note.        Assessment & Plan:  GERD  presumed improved on medication however is not resolved. She has no dysphasia. in the meantime should her colonoscopy without GI Addressing  her atypical chest pain CP improved  from above   But never had the stress echo.   rec she get this  And reschedule the test . Some of  her symptoms are after stress If not continuing to improve we should have you see Gi.  HT reasonably well controlled. Degenerative joint disease; somewhat problematic discussed using Tylenol she can use 653 times a day maximal minimize use of narcotics.  Followup to keep her appointment in August. Call in the meantime if needed.

## 2010-07-30 NOTE — Telephone Encounter (Signed)
Pt has a stress test scheduled for June 18th at 3pm. Left message for pt to call back about this.

## 2010-07-30 NOTE — Patient Instructions (Addendum)
We will contact cardiology about getting the stress test rescheduled. Continue on the dexilant  For now. ROV in about 2 months   If not continuing to get better then  May get Dr Leone Payor to see you.  Contact us in meantime if  Discomfort is worse. The patient to take Tylenol 650 mg 3 times a day maximum for your arthritis pain.

## 2010-08-13 ENCOUNTER — Ambulatory Visit (HOSPITAL_BASED_OUTPATIENT_CLINIC_OR_DEPARTMENT_OTHER): Payer: Medicare Other | Admitting: Radiology

## 2010-08-13 ENCOUNTER — Ambulatory Visit (HOSPITAL_COMMUNITY): Payer: Medicare Other | Attending: Cardiovascular Disease | Admitting: Radiology

## 2010-08-13 DIAGNOSIS — R0989 Other specified symptoms and signs involving the circulatory and respiratory systems: Secondary | ICD-10-CM

## 2010-08-13 DIAGNOSIS — R072 Precordial pain: Secondary | ICD-10-CM

## 2010-08-13 DIAGNOSIS — R079 Chest pain, unspecified: Secondary | ICD-10-CM | POA: Insufficient documentation

## 2010-09-07 ENCOUNTER — Other Ambulatory Visit: Payer: Self-pay | Admitting: Internal Medicine

## 2010-09-07 NOTE — Telephone Encounter (Signed)
Can refill simva x 2 until gets labs done  and hydrocodone X1

## 2010-09-07 NOTE — Telephone Encounter (Signed)
LOV 08/06/09 NOV 10/19/10 PLEASE ADVISE

## 2010-09-10 NOTE — Telephone Encounter (Signed)
Rx called in 

## 2010-09-23 ENCOUNTER — Other Ambulatory Visit: Payer: Self-pay | Admitting: Internal Medicine

## 2010-10-19 ENCOUNTER — Ambulatory Visit (INDEPENDENT_AMBULATORY_CARE_PROVIDER_SITE_OTHER): Payer: Medicare Other | Admitting: Internal Medicine

## 2010-10-19 ENCOUNTER — Encounter: Payer: Self-pay | Admitting: Internal Medicine

## 2010-10-19 DIAGNOSIS — M129 Arthropathy, unspecified: Secondary | ICD-10-CM

## 2010-10-19 DIAGNOSIS — K219 Gastro-esophageal reflux disease without esophagitis: Secondary | ICD-10-CM

## 2010-10-19 DIAGNOSIS — R7309 Other abnormal glucose: Secondary | ICD-10-CM

## 2010-10-19 DIAGNOSIS — Z0000 Encounter for general adult medical examination without abnormal findings: Secondary | ICD-10-CM

## 2010-10-19 DIAGNOSIS — R079 Chest pain, unspecified: Secondary | ICD-10-CM

## 2010-10-19 DIAGNOSIS — E782 Mixed hyperlipidemia: Secondary | ICD-10-CM

## 2010-10-19 DIAGNOSIS — I10 Essential (primary) hypertension: Secondary | ICD-10-CM

## 2010-10-19 LAB — TSH: TSH: 0.91 u[IU]/mL (ref 0.35–5.50)

## 2010-10-19 LAB — HEPATIC FUNCTION PANEL
ALT: 24 U/L (ref 0–35)
Total Bilirubin: 0.6 mg/dL (ref 0.3–1.2)

## 2010-10-19 LAB — LIPID PANEL
Cholesterol: 187 mg/dL (ref 0–200)
LDL Cholesterol: 87 mg/dL (ref 0–99)
Triglycerides: 66 mg/dL (ref 0.0–149.0)

## 2010-10-19 LAB — BASIC METABOLIC PANEL
BUN: 16 mg/dL (ref 6–23)
CO2: 29 mEq/L (ref 19–32)
Chloride: 103 mEq/L (ref 96–112)
Creatinine, Ser: 1.3 mg/dL — ABNORMAL HIGH (ref 0.4–1.2)

## 2010-10-19 LAB — CBC WITH DIFFERENTIAL/PLATELET
Basophils Absolute: 0 10*3/uL (ref 0.0–0.1)
Eosinophils Absolute: 0 10*3/uL (ref 0.0–0.7)
HCT: 39 % (ref 36.0–46.0)
Lymphs Abs: 1.2 10*3/uL (ref 0.7–4.0)
MCV: 84 fl (ref 78.0–100.0)
Monocytes Absolute: 0.5 10*3/uL (ref 0.1–1.0)
Platelets: 174 10*3/uL (ref 150.0–400.0)
RDW: 14.7 % — ABNORMAL HIGH (ref 11.5–14.6)

## 2010-10-19 NOTE — Patient Instructions (Signed)
Continue lifestyle intervention healthy eating and exercise . Will notify you  of labs when available. Call if want to get zostavax vaccine.

## 2010-10-19 NOTE — Progress Notes (Signed)
Subjective:    Patient ID: Cynthia Estrada, female    DOB: [birth year], 65 y.o.   MRN: [id number]  HPI Patient comes in today for Medicare wellness and followup of multiple medical problems. Since her last visit she has done fairly well she's up-to-date on her colonoscopies and is due in 3 years. Hypertension: Has been stable on medication. Chest pain: Has had occasional reflux symptoms but generally well no change in exercise tolerance walks on a regular basis. 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 Hyperlipidemia; no side effects of medication   Hearing:  Ok   Vision:  No limitations at present .  Glasses  Sees eye doc .  Hx of cataracts . Last seen last year .  Safety:  Has smoke detector and wears seat belts.  No firearms. No excess sun exposure. Sees dentist regularly. Safety reviewed   Falls: NO   Advance directive :  Reviewed    Memory: Felt to be good  , no concern from her or her family.  Depression: No anhedonia unusual crying or depressive symptoms  Nutrition: Eats well balanced diet; adequate calcium and vitamin D. No swallowing chewiing problems.  Injury: no major injuries in the last six months.  Other healthcare providers:  Reviewed today .  Social:  Lives alone. No pets.   Preventive parameters: up-to-date on colonoscopy, mammogram, immunizations. Including Tdap and pneumovax.  ADLS:   There are no problems or need for assistance  driving, feeding, obtaining food, dressing, toileting and bathing, managing money using phone. She is independent.    Review of Systems ROS:  GEN/ HEENTNo fever, significant weight changes sweats headaches vision problems hearing changes, CV/ PULM; No chest pain shortness of breath cough, syncope,edema  change in exercise tolerance. GI /GU: No adominal pain, vomiting, change in bowel habits. No blood in the stool. No significant GU symptoms. SKIN/HEME: ,no acute skin rashes  suspicious lesions or bleeding. No lymphadenopathy, nodules, masses.  NEURO/ PSYCH:  No neurologic signs such as weakness numbness No depression anxiety. IMM/ Allergy: No unusual infections.  Allergy .   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    Objective:   Physical Exam Physical Exam: Vital signs reviewed vision :and wears glasses tested by her eye doctor. Hearing  Grossly  normal YNW:GNFA is a well-developed well-nourished alert cooperative  AAfemale who appears her stated age in no acute distress.  HEENT: normocephalic  traumatic , Eyes: PERRL EOM's full, conjunctiva clear, Nares: paten,t no deformity discharge or tenderness., Ears: no deformity EAC's clear TMs with normal landmarks. Mouth: clear OP, no lesions, edema.  Moist mucous membranes. Dentition in adequate repair. NECK: supple without masses, thyromegaly or bruits. CHEST/PULM:  Clear to auscultation and percussion breath sounds equal no wheeze , rales or rhonchi. No chest wall deformities or tenderness. Breast: normal by inspection . No dimpling, discharge, masses, tenderness or discharge . CV: PMI is nondisplaced, S1 S2 no gallops, murmurs, rubs. Peripheral pulses are full without delay.No JVD .   Pulse  58 and regular  ABDOMEN: Bowel sounds normal nontender  No guard or rebound, no hepato splenomegal no CVA tenderness.  No hernia. Extremtities:  No clubbing cyanosis or edema, no acute joint swelling or redness no focal atrophy mild arthritic changes in her knees but no redness. NEURO:  Oriented x3, cranial nerves 3-12 appear to be  intact, no obvious focal weakness,gait within normal limits no abnormal reflexes or asymmetrical SKIN: No acute rashes normal turgor, color, no bruising or petechiae. PSYCH: Oriented, good eye contact, no obvious depression anxiety, cognition and judgment appear normal. LN: no cervical axillary inguinal  adenopathy Oriented x 3 and no noted deficits in memory, attention, and speech. PAP not indectaed had hysterectomy    Assessment & Plan:  Medicare wellness  Counseled regarding healthy nutrition, exercise, sleep, injury prevention, calcium vit d and healthy weight .  Hypertension no change  Hx of elevated Bg  Will check today LIPIDS OA knees mostly  Taking meds sparingly   3 x per week.  GERD Colon polyps UTD. Colonic polyps  utd

## 2010-10-24 ENCOUNTER — Other Ambulatory Visit: Payer: Self-pay | Admitting: Internal Medicine

## 2010-10-30 ENCOUNTER — Encounter: Payer: Self-pay | Admitting: *Deleted

## 2010-11-08 ENCOUNTER — Other Ambulatory Visit: Payer: Self-pay | Admitting: Internal Medicine

## 2010-11-21 ENCOUNTER — Telehealth: Payer: Self-pay | Admitting: Internal Medicine

## 2010-11-21 NOTE — Telephone Encounter (Signed)
Pt called to check on status of the forms she dropped of on 11/12/10. Pt has requested you contact  Her.

## 2010-11-22 NOTE — Telephone Encounter (Signed)
Pt aware that rx is ready to pick up 

## 2010-11-25 ENCOUNTER — Other Ambulatory Visit: Payer: Self-pay | Admitting: Internal Medicine

## 2010-11-30 ENCOUNTER — Other Ambulatory Visit: Payer: Self-pay | Admitting: *Deleted

## 2010-11-30 NOTE — Telephone Encounter (Signed)
Refill on hydrocodone 7.5/325 #40 last filled on 09/10/10

## 2010-11-30 NOTE — Telephone Encounter (Signed)
Refill Hydrocodone on [address]. Request was sent here on 11-25-2010 from pharmacy. Thanks.

## 2010-11-30 NOTE — Telephone Encounter (Signed)
Please call this in  

## 2010-11-30 NOTE — Telephone Encounter (Signed)
Script called in

## 2010-11-30 NOTE — Telephone Encounter (Signed)
Ok to refill x 1 if not already done . Cant tell from EHR

## 2010-12-02 ENCOUNTER — Other Ambulatory Visit: Payer: Self-pay | Admitting: Internal Medicine

## 2010-12-03 LAB — ETHANOL: Alcohol, Ethyl (B): <5

## 2010-12-03 LAB — RAPID URINE DRUG SCREEN, HOSP PERFORMED
Barbiturates: NOT DETECTED
Benzodiazepines: NOT DETECTED
Cocaine: NOT DETECTED
Opiates: NOT DETECTED

## 2010-12-03 LAB — LIPID PANEL
HDL: 62
Total CHOL/HDL Ratio: 3.1
Triglycerides: 82
VLDL: 16

## 2010-12-03 LAB — DIFFERENTIAL
Basophils Absolute: 0
Lymphocytes Relative: 21
Neutro Abs: 4.8
Neutrophils Relative %: 68

## 2010-12-03 LAB — POCT I-STAT CREATININE
Creatinine, Ser: 1.4 — ABNORMAL HIGH
Operator id: 294341

## 2010-12-03 LAB — CK TOTAL AND CKMB (NOT AT ARMC)
CK, MB: 1.9
Relative Index: 1.3
Total CK: 150
Total CK: 155

## 2010-12-03 LAB — I-STAT 8, (EC8 V) (CONVERTED LAB)
BUN: 25 — ABNORMAL HIGH
Bicarbonate: 23.7
Chloride: 107
Glucose, Bld: 175 — ABNORMAL HIGH
HCT: 40
Hemoglobin: 13.6
Operator id: 294341
Sodium: 138

## 2010-12-03 LAB — BASIC METABOLIC PANEL
CO2: 25
Calcium: 9.8
Creatinine, Ser: 1.32 — ABNORMAL HIGH
GFR calc Af Amer: 49 — ABNORMAL LOW
GFR calc non Af Amer: 41 — ABNORMAL LOW
Sodium: 140

## 2010-12-03 LAB — CBC
Hemoglobin: 12
Platelets: 196
RDW: 14.4

## 2010-12-03 LAB — POCT CARDIAC MARKERS: Myoglobin, poc: 127

## 2010-12-03 NOTE — Telephone Encounter (Signed)
Ok x 1 if not already done

## 2010-12-03 NOTE — Telephone Encounter (Signed)
LOV 11/21/10 NOV 04/22/11 Please advise

## 2010-12-03 NOTE — Telephone Encounter (Signed)
This has already been taken care of

## 2011-02-25 ENCOUNTER — Other Ambulatory Visit: Payer: Self-pay | Admitting: Family Medicine

## 2011-02-27 NOTE — Telephone Encounter (Signed)
Ask Dr. Panosh  

## 2011-03-04 ENCOUNTER — Telehealth: Payer: Self-pay | Admitting: Internal Medicine

## 2011-03-04 NOTE — Telephone Encounter (Signed)
Pr requesting refill on HYDROcodone-acetaminophen (NORCO) 7.5-325 MG per tablet   [id number] - [place] ([place]), [place] - [address]

## 2011-03-04 NOTE — Telephone Encounter (Signed)
LOV 10/19/10 NOV 04/22/11

## 2011-03-04 NOTE — Telephone Encounter (Signed)
Per Dr. Panosh- ok x 1 

## 2011-03-05 MED ORDER — HYDROCODONE-ACETAMINOPHEN 7.5-325 MG PO TABS: ORAL_TABLET | ORAL | Status: DC

## 2011-03-05 NOTE — Telephone Encounter (Signed)
Rx called in 

## 2011-04-22 ENCOUNTER — Ambulatory Visit (INDEPENDENT_AMBULATORY_CARE_PROVIDER_SITE_OTHER): Payer: Medicare Other | Admitting: Internal Medicine

## 2011-04-22 ENCOUNTER — Encounter: Payer: Self-pay | Admitting: Internal Medicine

## 2011-04-22 DIAGNOSIS — K219 Gastro-esophageal reflux disease without esophagitis: Secondary | ICD-10-CM

## 2011-04-22 DIAGNOSIS — M545 Low back pain, unspecified: Secondary | ICD-10-CM

## 2011-04-22 DIAGNOSIS — E782 Mixed hyperlipidemia: Secondary | ICD-10-CM

## 2011-04-22 DIAGNOSIS — I10 Essential (primary) hypertension: Secondary | ICD-10-CM

## 2011-04-22 MED ORDER — TIZANIDINE HCL 4 MG PO TABS: 4.0000 mg | ORAL_TABLET | Freq: Three times a day (TID) | ORAL | Status: DC | PRN

## 2011-04-22 MED ORDER — LOSARTAN POTASSIUM-HCTZ 50-12.5 MG PO TABS: 1.0000 | ORAL_TABLET | Freq: Every day | ORAL | Status: DC

## 2011-04-22 MED ORDER — DEXLANSOPRAZOLE 60 MG PO CPDR: 60.0000 mg | DELAYED_RELEASE_CAPSULE | Freq: Every day | ORAL | Status: DC

## 2011-04-22 NOTE — Patient Instructions (Signed)
Continue blood pressure medication and lipid medicine.  Use saline nose spray to help with congestion and no symptoms Tylenol is okay also for pain take care with Advil type medicines they can interfere with blood pressure.  We'll change to the new muscle relaxant to use as needed.  Recheck in 6 months at the wellness visit we'll do labs at that time

## 2011-04-22 NOTE — Progress Notes (Signed)
  Subjective:    Patient ID: Cynthia Estrada, female    DOB: [birth year], 66 y.o.   MRN: [id number]  HPI Patient comes in today for follow up of  multiple medical problems.   2 days of sinus scratchy  Throat and sinus HA  Congestion and sneezing  And coughing.  Was sick over the holiday.   Whole month of December. BP doing ok.  Lipid medicine no side effect Back: Gets occasional spasms in taking muscle relaxant 3 or 4 times a month. Insurance recommends changing for cost reasons to Zanaflex. NOt taking carcotic very often at all. stil has some left. GERD: Sometimes gets scratchy throat Dexilant Taxol on every day no significant heartburn dysphagia.    Review of Systems No fever   Cp sob but feel weak in chest.  ? gerd .  No swelling . No vision change and no  Fainting.  No bleeding takes advil sinus at [facility] for sinus problems.  Past history family history social history reviewed in the electronic medical record. Outpatient Prescriptions Prior to Visit  Medication Sig Dispense Refill  . Ascorbic Acid (VITAMIN C) 500 MG tablet Take 500 mg by mouth daily.        Marland Kitchen aspirin 81 MG tablet Take 81 mg by mouth daily.        . cholecalciferol (VITAMIN D) 1000 UNIT tablet Take 1,000 Units by mouth daily.        . cyclobenzaprine (FLEXERIL) 10 MG tablet TAKE ONE TABLET BY MOUTH THREE TIMES DAILY AS NEEDED FOR BACK SPASMS  30 tablet  0  . HYDROcodone-acetaminophen (NORCO) 7.5-325 MG per tablet Take 1-2 tabs every days as needed  40 tablet  0  . simvastatin (ZOCOR) 40 MG tablet TAKE ONE TABLET BY MOUTH EVERY DAY  30 tablet  5  . DEXILANT 60 MG capsule TAKE ONE CAPSULE  BY MOUTH EVERY DAY  30 capsule  5  . losartan-hydrochlorothiazide (HYZAAR) 50-12.5 MG per tablet TAKE ONE TABLET BY MOUTH EVERY DAY  30 tablet  5       Objective:   Physical Exam  WDWN in nad HEENT: Normocephalic ;atraumatic , Eyes;  PERRL, EOMs  Full, lids and conjunctiva clear,,Ears: no deformities, canals nl, TM landmarks  normal, Nose: no deformity or discharge  Congested  Face non tonderMouth : OP clear without lesion or edema . Neck: Supple without adenopathy or masses or bruits Chest:  Clear to A&P without wheezes rales or rhonchi CV:  S1-S2 no gallops or murmurs peripheral perfusion is normal Abdomen:  Sof,t normal bowel sounds without hepatosplenomegaly, no guarding rebound or masses no CVA tenderness Skin: normal capillary refill ,turgor , color: No acute rashes ,petechiae or bruising     Assessment & Plan:  Ht  controlled LIPIDS  On meds  Back  Need med  3 x per month . Change Flexeril to Zanaflex discussion. Headache sinus problem this could be viral versus other expectant management discussed. Exam normal except for stuffiness today.   Refilled appropriate medicines and followup with checkup wellness labs in 6 months.

## 2011-05-15 ENCOUNTER — Other Ambulatory Visit: Payer: Self-pay | Admitting: Internal Medicine

## 2011-05-16 NOTE — Telephone Encounter (Signed)
Refill x1 

## 2011-05-16 NOTE — Telephone Encounter (Signed)
rx called in

## 2011-08-07 ENCOUNTER — Telehealth: Payer: Self-pay | Admitting: Internal Medicine

## 2011-08-07 NOTE — Telephone Encounter (Signed)
Ok to refill x 1  

## 2011-08-07 NOTE — Telephone Encounter (Signed)
Pt requesting refill on HYDROcodone-acetaminophen (NORCO) 7.5-325 MG per tablet   [facility] - [place] ([place]), [place] - [address]

## 2011-08-07 NOTE — Telephone Encounter (Signed)
This pt was last seen 04/22/11 and has a future appt. For 10/22/11.  Does pt need an OV for the med refill?  Please advise.

## 2011-08-08 ENCOUNTER — Other Ambulatory Visit: Payer: Self-pay | Admitting: Family Medicine

## 2011-08-08 MED ORDER — HYDROCODONE-ACETAMINOPHEN 7.5-325 MG PO TABS: 40.0000 | ORAL_TABLET | Freq: Every day | ORAL | Status: DC

## 2011-08-08 NOTE — Telephone Encounter (Signed)
This information was left on the answering machine at the pharmacy.  Given #40 with 0 refills.  Left my name and telephone number incase of any questions.

## 2011-08-14 ENCOUNTER — Other Ambulatory Visit: Payer: Self-pay | Admitting: Internal Medicine

## 2011-08-14 DIAGNOSIS — Z1231 Encounter for screening mammogram for malignant neoplasm of breast: Secondary | ICD-10-CM

## 2011-08-27 ENCOUNTER — Ambulatory Visit
Admission: RE | Admit: 2011-08-27 | Discharge: 2011-08-27 | Disposition: A | Discharge status: Home / Self Care | Payer: Medicare Other | Source: Ambulatory Visit | Attending: Internal Medicine | Admitting: Internal Medicine

## 2011-08-27 DIAGNOSIS — Z1231 Encounter for screening mammogram for malignant neoplasm of breast: Secondary | ICD-10-CM

## 2011-10-22 ENCOUNTER — Ambulatory Visit (INDEPENDENT_AMBULATORY_CARE_PROVIDER_SITE_OTHER): Payer: Medicare Other | Admitting: Internal Medicine

## 2011-10-22 ENCOUNTER — Encounter: Payer: Self-pay | Admitting: Internal Medicine

## 2011-10-22 VITALS — BP 140/82 | HR 59 | Temp 98.3°F | Ht 65.5 in | Wt 212.0 lb

## 2011-10-22 DIAGNOSIS — M791 Myalgia, unspecified site: Secondary | ICD-10-CM

## 2011-10-22 DIAGNOSIS — Z860100 Personal history of colon polyps, unspecified: Secondary | ICD-10-CM

## 2011-10-22 DIAGNOSIS — R110 Nausea: Secondary | ICD-10-CM

## 2011-10-22 DIAGNOSIS — I10 Essential (primary) hypertension: Secondary | ICD-10-CM

## 2011-10-22 DIAGNOSIS — E782 Mixed hyperlipidemia: Secondary | ICD-10-CM

## 2011-10-22 DIAGNOSIS — K219 Gastro-esophageal reflux disease without esophagitis: Secondary | ICD-10-CM

## 2011-10-22 DIAGNOSIS — Z23 Encounter for immunization: Secondary | ICD-10-CM

## 2011-10-22 DIAGNOSIS — Z79899 Other long term (current) drug therapy: Secondary | ICD-10-CM

## 2011-10-22 DIAGNOSIS — N951 Menopausal and female climacteric states: Secondary | ICD-10-CM

## 2011-10-22 DIAGNOSIS — M129 Arthropathy, unspecified: Secondary | ICD-10-CM

## 2011-10-22 DIAGNOSIS — Z0000 Encounter for general adult medical examination without abnormal findings: Secondary | ICD-10-CM

## 2011-10-22 DIAGNOSIS — R7309 Other abnormal glucose: Secondary | ICD-10-CM

## 2011-10-22 LAB — BASIC METABOLIC PANEL
BUN: 11 mg/dL (ref 6–23)
Creatinine, Ser: 1.2 mg/dL (ref 0.4–1.2)
GFR: 56.97 mL/min — ABNORMAL LOW (ref >60.00)
Glucose, Bld: 106 mg/dL — ABNORMAL HIGH (ref 70–99)
Potassium: 4 mEq/L (ref 3.5–5.1)

## 2011-10-22 LAB — HEPATIC FUNCTION PANEL
AST: 22 U/L (ref 0–37)
Albumin: 4.5 g/dL (ref 3.5–5.2)
Total Bilirubin: 0.8 mg/dL (ref 0.3–1.2)

## 2011-10-22 LAB — CBC WITH DIFFERENTIAL/PLATELET
Basophils Relative: 0.3 % (ref 0.0–3.0)
Eosinophils Relative: 1.3 % (ref 0.0–5.0)
HCT: 38.4 % (ref 36.0–46.0)
Monocytes Relative: 12.4 % — ABNORMAL HIGH (ref 3.0–12.0)
Neutrophils Relative %: 58.2 % (ref 43.0–77.0)
Platelets: 158 10*3/uL (ref 150.0–400.0)
RBC: 4.56 Mil/uL (ref 3.87–5.11)
WBC: 4 10*3/uL — ABNORMAL LOW (ref 4.5–10.5)

## 2011-10-22 LAB — LIPID PANEL
Cholesterol: 186 mg/dL (ref 0–200)
LDL Cholesterol: 84 mg/dL (ref 0–99)
Total CHOL/HDL Ratio: 2
Triglycerides: 127 mg/dL (ref 0.0–149.0)
VLDL: 25.4 mg/dL (ref 0.0–40.0)

## 2011-10-22 LAB — HEMOGLOBIN A1C: Hgb A1c MFr Bld: 5.9 % (ref 4.6–6.5)

## 2011-10-22 MED ORDER — LOSARTAN POTASSIUM-HCTZ 50-12.5 MG PO TABS: 1.0000 | ORAL_TABLET | Freq: Every day | ORAL | Status: DC

## 2011-10-22 MED ORDER — DEXLANSOPRAZOLE 60 MG PO CPDR: 60.0000 mg | DELAYED_RELEASE_CAPSULE | Freq: Every day | ORAL | Status: DC

## 2011-10-22 NOTE — Progress Notes (Signed)
Subjective:    Patient ID: Cynthia Estrada, female    DOB: June 18, 1945, 66 y.o.   MRN: [id number]  HPI Patient comes in today for preventive visit and follow-up of medical issues. Update  history since  last visit: No major changes but having   Nausea for a few days and achy all over for about a month No fever but feels sweaty at times   Poss from heat . Taking care of niece and nephew  8 months and 3 years ad could have caught a bug.   One day of nausea  And   Nephew had strep and uri.   No known fever but achy all over  No change in meds  GERD stable BP controlled  No se of meds noted  Joint pain taking norco about 1-2 x per week if needed    Hearing:  Ok    Vision:  No limitations at present . Gets reg eye checks. 2013 on [address]; No glaucoma or catar  Safety:  Has smoke detector and wears seat belts.  No firearms. NOt seeing . dentist regularly. For cost reasons.   Falls:  No   Advance directive :  Reviewed   Memory: Felt to be good  , no concern from her or her family.  Depression: No anhedonia unusual crying or depressive symptoms  Nutrition: Eats well balanced diet; adequate calcium and vitamin D. No swallowing chewiing problems.  Injury: no major injuries in the last six months.  Other healthcare providers:  Reviewed today .  Social:  Lives alone No pets.   Preventive parameters: up-to-date on colonoscopy, mammogram, immunizations. Including Tdap and pneumovax.  ADLS:   There are no problems or need for assistance  driving, feeding, obtaining food, dressing, toileting and bathing, managing money using phone. She is independent.  Exercise 2 x per week    Walking with church group.     Review of Systems ROS:  GEN/ HEENT: No fever, significant weight changes headaches vision problems hearing changes, CV/ PULM; No chest pain some tightness at times shortness of breath cough, syncope,edema  change in exercise tolerance. GI /GU: No adominal pain, vomiting, no change in  bowel habits except as above. No blood in the stool. No significant GU symptoms. SKIN/HEME: ,no acute skin rashes suspicious lesions or bleeding. No lymphadenopathy, nodules, masses.  NEURO/ PSYCH:  No neurologic signs such as weakness numbness. No depression anxiety. IMM/ Allergy: No unusual infections.  Allergy .   REST of 12 system review negative except as per HPI  Outpatient Encounter Prescriptions as of 10/22/2011  Medication Sig Dispense Refill  . Ascorbic Acid (VITAMIN C) 500 MG tablet Take 500 mg by mouth daily.        Marland Kitchen aspirin 81 MG tablet Take 81 mg by mouth daily.        . cholecalciferol (VITAMIN D) 1000 UNIT tablet Take 1,000 Units by mouth daily.        Marland Kitchen dexlansoprazole (DEXILANT) 60 MG capsule Take 1 capsule (60 mg total) by mouth daily.  30 capsule  5  . HYDROcodone-acetaminophen (NORCO) 7.5-325 MG per tablet Take 40 tablets by mouth daily. TAKE ONE TO TWO TABLETS BY MOUTH EVERY DAY AS NEEDED  40 tablet  0  . losartan-hydrochlorothiazide (HYZAAR) 50-12.5 MG per tablet Take 1 tablet by mouth daily.  30 tablet  5  . simvastatin (ZOCOR) 40 MG tablet TAKE ONE TABLET BY MOUTH EVERY DAY  30 tablet  5  . DISCONTD: dexlansoprazole (  DEXILANT) 60 MG capsule Take 1 capsule (60 mg total) by mouth daily.  30 capsule  2   Past history family history social history reviewed in the electronic medical record. Past Medical History  Diagnosis Date  . Unspecified vitamin D deficiency 02/10/2007  . HYPERLIPIDEMIA 01/12/2007  . ANXIETY 11/11/2006  . HYPERTENSION 11/11/2006  . HOT FLASHES 04/07/2008  . UNSPECIFIED ARTHROPATHY SITE UNSPECIFIED 01/12/2007  . BACK PAIN, LUMBAR 07/11/2008  . LEG CRAMPS 01/12/2009  . FASTING HYPERGLYCEMIA 01/12/2007  . SYNCOPE, HX OF 02/10/2007  . History of varicella   . Arthritis   . 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 Normal stress echocardiogram 2012  . History of CT scan of chest     History   Social History  . Marital  Status: Single    Spouse Name: N/A    Number of Children: N/A  . Years of Education: N/A   Occupational History  . Retire     [employer]   Social History Main Topics  . Smoking status: Never Smoker   . Smokeless tobacco: Never Used  . Alcohol Use: No  . Drug Use: No  . Sexually Active: Not on file   Other Topics Concern  . 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 Abdominal hysterectomy     age 73 for fibroids  . Hemicollectomy 08/2009    Right for tubulovillous adenomas (2)  . Colonoscopy 07/01/2009    Two tubulovillous adenoms 7 and 4 cm size  . Colonoscopy w/ polypectomy 07/11/2010    two 7-8 mm polyps removed - adenomas    Family History  Problem Relation Age of Onset  . Hypertension Mother   . Diabetes Mother   . Hypertension Sister   . Hypertension Brother   . Hyperlipidemia Brother     Allergies  Allergen Reactions  . Lisinopril     REACTION: ? cough      Objective:   Physical Exam BP 140/82  Pulse 59  Temp 98.3 F (36.8 C)  Ht 5' 5.5" (1.664 m)  Wt 212 lb (96.163 kg)  BMI 34.74 kg/m2  SpO2 98% Physical Exam: Vital signs reviewed ZOX:WRUE is a well-developed well-nourished alert cooperative  aa  female who appears her stated age in no acute distress.  HEENT: normocephalic atraumatic , Eyes: PERRL EOM's full, conjunctiva clear, Nares: paten,t no deformity discharge or tenderness., Ears: no deformity EAC's clear TMs with normal landmarks. Mouth: clear OP, no lesions, edema.  Moist mucous membranes. Dentition in adequate repair. NECK: supple without masses, thyromegaly or bruits. CHEST/PULM:  Clear to auscultation and percussion breath sounds equal no wheeze , rales or rhonchi. No chest wall deformities or tenderness. Breast: normal by inspection . No dimpling, discharge,  masses, tenderness or discharge . CV: PMI is nondisplaced, S1 S2 no gallops, murmurs, rubs. Peripheral pulses are full without delay.No JVD .  ABDOMEN: Bowel sounds normal nontender  No guard or rebound, no hepato splenomegal no CVA tenderness.  No hernia. Extremtities:  No clubbing cyanosis +1 edema at ankles no redness,  redness no focal atrophy  There is in the MCP joints but no redness   arthritic changes  NEURO:  Oriented x3, cranial nerves 3-12 appear to be intact, no obvious focal weakness,gait within normal limits  asymmetrical SKIN: No acute rashes normal turgor, color, no bruising or petechiae. PSYCH: Oriented, good eye contact, no obvious depression anxiety, cognition and judgment appear normal. LN: no cervical axillary inguinal adenopathy Oriented x 3 and no noted deficits in memory, attention, and speech. Addendum     Assessment & Plan:  Preventive Health Care Counseled regarding healthy nutrition, exercise, sleep, injury prevention, calcium vit d and healthy weight . zostavax  Today   Hypertension reasonably controlled borderline today. She has a relative bradycardia had cardiac evaluation last year with a negative normal stress echo. NEW  Recent nausea 2-3 days possibly gastritis agree could be viral with her loose stools gotten from her nieces and nephews. Expectant management followup if persisting NEW  Body aches better over baseline from her arthritis and include her feet and her upper arms check sedimentation rate laboratory studies consider PMR arthritis etc. Consider side effect of simvastatin. Nonfocal exam today but does have some arthritic changes.  GERD takes medicine every day recently well-controlled  LIPIDS on simva  See above   DJD taking narcotic occasional one to 2 times a week at the most.  History of fasting hyperglycemia check A1c today  History of adenomatous polyps in the colon up-to-date on colonoscopy  Up-to-date on health care parameters  Zostavax given today. Avoid contact with infant if gets rash  Patient has form today to fill out for her Medicare wellness. Will do this and get back to her   Aug 31  Addendum:  Labs good except cpk is  Mildly elevated will inform her to definitely stop the statin and plan FU in 2 months with cpk   Lipid Vit d and plan to change med if needed .  Lab Results  Component Value Date   WBC 4.0* 10/22/2011   HGB 12.4 10/22/2011   HCT 38.4 10/22/2011   PLT 158.0 10/22/2011   GLUCOSE 106* 10/22/2011   CHOL 186 10/22/2011   TRIG 127.0 10/22/2011   HDL 76.90 10/22/2011   LDLDIRECT 115.1 01/06/2007   LDLCALC 84 10/22/2011   ALT 18 10/22/2011   AST 22 10/22/2011   NA 141 10/22/2011   K 4.0 10/22/2011   CL 104 10/22/2011   CREATININE 1.2 10/22/2011   BUN 11 10/22/2011   CO2 27 10/22/2011   TSH 1.14 10/22/2011   HGBA1C 5.9 10/22/2011   Lab Results  Component Value Date   ESRSEDRATE 11 10/22/2011

## 2011-10-22 NOTE — Patient Instructions (Addendum)
Will notify you  of labs when available. Uncertain why you have the body aches this could be arthritis but it also could be a side effect of the cholesterol medicine.  If your laboratory tests are good would have you try stopping the simvastatin for a short while 3-4 weeks and see how you feel. If you are better restart and if the symptoms come back then notify us come in for office visit to discuss. It is possible the nausea is a stomach bug should be a lot better in the next week or so. Contact us if you're having fever persistent symptoms or vomiting or severe pain. For follow up visit   Continue appropriate exercise healthy eating no change in medications Return in 6 months or earlier if you're above symptoms aren't better  Will have you pick up your form when ready .  You will have to finish and send it in.Marland Kitchen

## 2011-10-23 LAB — CYCLIC CITRUL PEPTIDE ANTIBODY, IGG: Cyclic Citrullin Peptide Ab: <2 U/mL (ref 0.0–5.0)

## 2011-10-29 ENCOUNTER — Other Ambulatory Visit: Payer: Self-pay | Admitting: Family Medicine

## 2011-10-29 DIAGNOSIS — E785 Hyperlipidemia, unspecified: Secondary | ICD-10-CM

## 2011-10-29 DIAGNOSIS — R748 Abnormal levels of other serum enzymes: Secondary | ICD-10-CM

## 2011-10-29 DIAGNOSIS — E559 Vitamin D deficiency, unspecified: Secondary | ICD-10-CM

## 2011-12-23 ENCOUNTER — Other Ambulatory Visit (INDEPENDENT_AMBULATORY_CARE_PROVIDER_SITE_OTHER): Payer: Medicare Other

## 2011-12-23 DIAGNOSIS — E559 Vitamin D deficiency, unspecified: Secondary | ICD-10-CM

## 2011-12-23 DIAGNOSIS — R748 Abnormal levels of other serum enzymes: Secondary | ICD-10-CM

## 2011-12-23 DIAGNOSIS — E785 Hyperlipidemia, unspecified: Secondary | ICD-10-CM

## 2011-12-23 LAB — LIPID PANEL
Cholesterol: 279 mg/dL — ABNORMAL HIGH (ref 0–200)
Triglycerides: 96 mg/dL (ref 0.0–149.0)

## 2011-12-23 LAB — CK: Total CK: 150 U/L (ref 7–177)

## 2011-12-25 ENCOUNTER — Telehealth: Payer: Self-pay | Admitting: Family Medicine

## 2011-12-25 NOTE — Telephone Encounter (Signed)
Ok to refill x 1  

## 2011-12-25 NOTE — Telephone Encounter (Signed)
Pt is requesting refills.  Last seen on 10/22/11 for her Wellness Exam.  Last filled on 08/08/11 #30 0 additional refills.  Please advise.  Thanks!!

## 2011-12-26 ENCOUNTER — Other Ambulatory Visit: Payer: Self-pay | Admitting: Family Medicine

## 2011-12-26 MED ORDER — HYDROCODONE-ACETAMINOPHEN 7.5-325 MG PO TABS: 40.0000 | ORAL_TABLET | Freq: Every day | ORAL | Status: DC

## 2011-12-26 NOTE — Telephone Encounter (Signed)
Called to the pharmacy and left on machine. 

## 2012-01-02 ENCOUNTER — Encounter: Payer: Self-pay | Admitting: Internal Medicine

## 2012-01-02 ENCOUNTER — Ambulatory Visit (INDEPENDENT_AMBULATORY_CARE_PROVIDER_SITE_OTHER): Payer: Medicare Other | Admitting: Internal Medicine

## 2012-01-02 VITALS — BP 134/82 | HR 65 | Temp 99.4°F | Wt 205.0 lb

## 2012-01-02 DIAGNOSIS — I10 Essential (primary) hypertension: Secondary | ICD-10-CM

## 2012-01-02 DIAGNOSIS — E782 Mixed hyperlipidemia: Secondary | ICD-10-CM

## 2012-01-02 DIAGNOSIS — E559 Vitamin D deficiency, unspecified: Secondary | ICD-10-CM

## 2012-01-02 DIAGNOSIS — T887XXA Unspecified adverse effect of drug or medicament, initial encounter: Secondary | ICD-10-CM

## 2012-01-02 DIAGNOSIS — R059 Cough, unspecified: Secondary | ICD-10-CM

## 2012-01-02 DIAGNOSIS — Z23 Encounter for immunization: Secondary | ICD-10-CM

## 2012-01-02 DIAGNOSIS — R05 Cough: Secondary | ICD-10-CM

## 2012-01-02 DIAGNOSIS — M129 Arthropathy, unspecified: Secondary | ICD-10-CM

## 2012-01-02 MED ORDER — PRAVASTATIN SODIUM 20 MG PO TABS: 20.0000 mg | ORAL_TABLET | Freq: Every day | ORAL | Status: DC

## 2012-01-02 NOTE — Patient Instructions (Signed)
We'll try a different cholesterol medicine to take one a day recheck lipids and liver test in about 2-3 months in followup visit. If you're getting increasing body aches or side effects contact us in the meantime.  Your chest exam is clear today he may have a chest cold virus that is causing her cough expected to get better in another week or so.  Okay to get flu shot today  Contact us about refills on the controlled medication better you were using as needed.

## 2012-01-02 NOTE — Progress Notes (Signed)
Chief Complaint  Patient presents with  . Follow-up    Labs    HPI: Pt comesin for fu if lipids after stopping simva.  myalgias are better but still has joint aches . Using ocass hydrocodone and some mess up with pharmacy and only refills 12 when 39 ordered she now has bottle of almost 30.  Has mild dry cough no fever  Mild uri congestion no meds.no sob wheeze . Needs flu vaccine ROS: See pertinent positives and negatives per HPI. No nv bleeding  Past Medical History  Diagnosis Date  . Unspecified vitamin D deficiency 02/10/2007  . HYPERLIPIDEMIA 01/12/2007  . ANXIETY 11/11/2006  . HYPERTENSION 11/11/2006  . HOT FLASHES 04/07/2008  . UNSPECIFIED ARTHROPATHY SITE UNSPECIFIED 01/12/2007  . BACK PAIN, LUMBAR 07/11/2008  . LEG CRAMPS 01/12/2009  . FASTING HYPERGLYCEMIA 01/12/2007  . SYNCOPE, HX OF 02/10/2007  . History of varicella   . Arthritis   . 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 Normal stress echocardiogram 2012  . History of CT scan of chest     Family History  Problem Relation Age of Onset  . Hypertension Mother   . Diabetes Mother   . Hypertension Sister   . Hypertension Brother   . Hyperlipidemia Brother     History   Social History  . Marital Status: Single    Spouse Name: N/A    Number of Children: N/A  . Years of Education: N/A   Occupational History  . Retire     [employer]   Social History Main Topics  . Smoking status: Never Smoker   . Smokeless tobacco: Never Used  . Alcohol Use: No  . Drug Use: No  . Sexually Active: None   Other Topics Concern  . 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  , Disp: , Rfl: ;  aspirin 81 MG tablet, Take 81 mg by mouth daily.  , Disp: , Rfl: ;   cholecalciferol (VITAMIN D) 1000 UNIT tablet, Take 1,000 Units by mouth daily.  , Disp: , Rfl: ;  dexlansoprazole (DEXILANT) 60 MG capsule, Take 1 capsule (60 mg total) by mouth daily., Disp: 30 capsule, Rfl: 5 HYDROcodone-acetaminophen (NORCO) 7.5-325 MG per tablet, Take 40 tablets by mouth daily. TAKE ONE TO TWO TABLETS BY MOUTH EVERY DAY AS NEEDED, Disp: 30 tablet, Rfl: 0;  losartan-hydrochlorothiazide (HYZAAR) 50-12.5 MG per tablet, Take 1 tablet by mouth daily., Disp: 30 tablet, Rfl: 12;  pravastatin (PRAVACHOL) 20 MG tablet, Take 1 tablet (20 mg total) by mouth daily., Disp: 30 tablet, Rfl: 3  EXAM: BP 134/82  Pulse 65  Temp 99.4 F (37.4 C) (Oral)  Wt 205 lb (92.987 kg)  SpO2 97%  GENERAL: vitals reviewed and listed above, alert, oriented, appears well hydrated and in no acute distress  HEENT: Normocephalic ;atraumatic , Eyes;  PERRL, EOMs  Full, lids and conjunctiva clear,,Ears: no deformities, canals nl, TM landmarks normal, Nose: no deformity or discharge  Mouth : OP clear without lesion or edema .  NECK: no obvious masses on inspection palpation   LUNGS: clear to auscultation bilaterally, no wheezes, rales or rhonchi, good air movement  CV: HRRR, no clubbing cyanosis or  peripheral edema nl cap refill   MS: moves  all extremities without noticeable focal  abnormality  PSYCH: pleasant and cooperative, no obvious depression or anxiety Lab Results  Component Value Date   WBC 4.0* 10/22/2011   HGB 12.4 10/22/2011   HCT 38.4 10/22/2011   PLT 158.0 10/22/2011   GLUCOSE 106* 10/22/2011   CHOL 279* 12/23/2011   TRIG 96.0 12/23/2011   HDL 67.60 12/23/2011   LDLDIRECT 205.2 12/23/2011   LDLCALC 84 10/22/2011   ALT 18 10/22/2011   AST 22 10/22/2011   NA 141 10/22/2011   K 4.0 10/22/2011   CL 104 10/22/2011   CREATININE 1.2 10/22/2011   BUN 11 10/22/2011   CO2 27 10/22/2011   TSH 1.14 10/22/2011   HGBA1C 5.9 10/22/2011    ASSESSMENT AND PLAN:  Discussed the following assessment and  plan:  1. HYPERLIPIDEMIA    ldlover 200 off simva cannot take atorva try pravachol and increase as tolerated  2. Need for prophylactic vaccination and inoculation against influenza   3. HYPERTENSION   4. UNSPECIFIED ARTHROPATHY SITE UNSPECIFIED    ocass use of narcotics problem with pharmacy refill can contact us first . caution discussed  5. Medication side effect    simvastatin  6. Cough    nl chest exam  poss viral rti  unremarkable exam  7. Unspecified vitamin D deficiency    ok to day     -Patient advised to return or notify us immediately if symptoms worsen or persist or new concerns arise.  Patient Instructions  We'll try a different cholesterol medicine to take one a day recheck lipids and liver test in about 2-3 months in followup visit. If you're getting increasing body aches or side effects contact us in the meantime.  Your chest exam is clear today he may have a chest cold virus that is causing her cough expected to get better in another week or so.  Okay to get flu shot today  Contact us about refills on the controlled medication better you were using as needed.     Lorretta Harp

## 2012-02-13 ENCOUNTER — Encounter: Payer: Self-pay | Admitting: Internal Medicine

## 2012-02-27 ENCOUNTER — Telehealth: Payer: Self-pay | Admitting: Family Medicine

## 2012-02-27 NOTE — Telephone Encounter (Signed)
The pt is requesting refills.  She was last seen on 01/02/12 and has a fu on 04/23/12.  Last filled on 12/26/11 #30 with 0 additional refills.  Please advise.  Thanks!!

## 2012-02-28 ENCOUNTER — Other Ambulatory Visit: Payer: Self-pay | Admitting: Family Medicine

## 2012-02-28 MED ORDER — HYDROCODONE-ACETAMINOPHEN 7.5-325 MG PO TABS: 40.0000 | ORAL_TABLET | Freq: Every day | ORAL | Status: DC

## 2012-02-28 NOTE — Telephone Encounter (Signed)
Refill x1 

## 2012-02-28 NOTE — Telephone Encounter (Signed)
Called to the pharmacy and left on voicemail. 

## 2012-03-06 ENCOUNTER — Other Ambulatory Visit: Payer: Self-pay | Admitting: Internal Medicine

## 2012-04-16 ENCOUNTER — Other Ambulatory Visit (INDEPENDENT_AMBULATORY_CARE_PROVIDER_SITE_OTHER): Payer: Medicare Other

## 2012-04-16 DIAGNOSIS — Z0000 Encounter for general adult medical examination without abnormal findings: Secondary | ICD-10-CM

## 2012-04-16 DIAGNOSIS — I10 Essential (primary) hypertension: Secondary | ICD-10-CM

## 2012-04-16 DIAGNOSIS — E782 Mixed hyperlipidemia: Secondary | ICD-10-CM

## 2012-04-16 LAB — LIPID PANEL
Cholesterol: 196 mg/dL (ref 0–200)
VLDL: 20 mg/dL (ref 0.0–40.0)

## 2012-04-16 LAB — HEPATIC FUNCTION PANEL
ALT: 16 U/L (ref 0–35)
AST: 20 U/L (ref 0–37)
Alkaline Phosphatase: 65 U/L (ref 39–117)
Bilirubin, Direct: 0.1 mg/dL (ref 0.0–0.3)
Total Bilirubin: 0.6 mg/dL (ref 0.3–1.2)
Total Protein: 6.8 g/dL (ref 6.0–8.3)

## 2012-04-21 ENCOUNTER — Other Ambulatory Visit: Payer: Self-pay | Admitting: Internal Medicine

## 2012-04-23 ENCOUNTER — Ambulatory Visit: Payer: Medicare Other | Admitting: Internal Medicine

## 2012-04-28 ENCOUNTER — Ambulatory Visit (INDEPENDENT_AMBULATORY_CARE_PROVIDER_SITE_OTHER): Payer: Medicare Other | Admitting: Internal Medicine

## 2012-04-28 ENCOUNTER — Encounter: Payer: Self-pay | Admitting: Internal Medicine

## 2012-04-28 VITALS — BP 138/84 | HR 76 | Temp 99.2°F | Wt 205.0 lb

## 2012-04-28 DIAGNOSIS — E2839 Other primary ovarian failure: Secondary | ICD-10-CM

## 2012-04-28 DIAGNOSIS — I10 Essential (primary) hypertension: Secondary | ICD-10-CM

## 2012-04-28 DIAGNOSIS — E782 Mixed hyperlipidemia: Secondary | ICD-10-CM

## 2012-04-28 DIAGNOSIS — R5381 Other malaise: Secondary | ICD-10-CM

## 2012-04-28 LAB — GLUCOSE, POCT (MANUAL RESULT ENTRY): POC Glucose: 111 mg/dl — AB (ref 70–99)

## 2012-04-28 NOTE — Progress Notes (Signed)
Chief Complaint  Patient presents with  . Follow-up    Complains of fatigue since December. lipids    HPI: For fu of lipid medication initiation  Hx of se of simva   Seems to tolerate the new medication without problem. She said she had a stomach virus in December and her energy levels never been back to normal. Denies chest pain shortness of breath but does say she feels like she has to sit down at times no palpitations no syncope. No unusual bleeding. Denies muscle aches joint aches that she had on simvastatin. She's also brings in a sheet of paper from the [facility] nursing assessment was questions on it such as when was her last tetanus etc. bp at [facility] nurse  128/80 p 58 rr 12   Had normal minicog.    States that she isn't using the hydrocodone and Zanaflex on a regular basis it doesn't think it's related to back. ROS: See pertinent positives and negatives per HPI.  Past Medical History  Diagnosis Date  . Unspecified vitamin D deficiency 02/10/2007  . HYPERLIPIDEMIA 01/12/2007  . ANXIETY 11/11/2006  . HYPERTENSION 11/11/2006  . HOT FLASHES 04/07/2008  . UNSPECIFIED ARTHROPATHY SITE UNSPECIFIED 01/12/2007  . BACK PAIN, LUMBAR 07/11/2008  . LEG CRAMPS 01/12/2009  . FASTING HYPERGLYCEMIA 01/12/2007  . SYNCOPE, HX OF 02/10/2007  . History of varicella   . Arthritis   . 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 Normal stress echocardiogram 2012  . History of CT scan of chest     Family History  Problem Relation Age of Onset  . Hypertension Mother   . Diabetes Mother   . Hypertension Sister   . Hypertension Brother   . Hyperlipidemia Brother     History   Social History  . Marital Status: Single    Spouse Name: N/A    Number of Children: N/A  . Years of Education: N/A   Occupational History  . Retire     [employer]   Social History Main Topics  . Smoking status: Never Smoker   . Smokeless tobacco: Never Used  . Alcohol Use: No  . Drug Use: No   . Sexually Active: None   Other Topics Concern  . 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 Ascorbic Acid (VITAMIN C) 500 MG tablet Take 500 mg by mouth daily.        Marland Kitchen aspirin 81 MG tablet Take 81 mg by mouth daily.        . cholecalciferol (VITAMIN D) 1000 UNIT tablet Take 1,000 Units by mouth daily.        Marland Kitchen DEXILANT 60 MG capsule TAKE ONE CAPSULE BY MOUTH EVERY DAY  30 capsule  0  . HYDROcodone-acetaminophen (NORCO) 7.5-325 MG per tablet Take 40 tablets by mouth daily. TAKE ONE TO TWO TABLETS BY MOUTH EVERY DAY AS NEEDED  30 tablet  0  . losartan-hydrochlorothiazide (HYZAAR) 50-12.5 MG per tablet Take 1 tablet by mouth daily.  30 tablet  12  . pravastatin (PRAVACHOL) 20 MG tablet Take 1 tablet (20 mg total) by mouth daily.  30 tablet  3  . tiZANidine (ZANAFLEX) 4 MG tablet TAKE ONE TABLET  BY MOUTH EVERY 8 HOURS AS NEEDED FOR BACK SPASMS  30 tablet  1   No facility-administered encounter medications on file as of 04/28/2012.    EXAM:  BP 138/84  Pulse 76  Temp(Src) 99.2 F (37.3 C) (Oral)  Wt 205 lb (92.987 kg)  BMI 33.58 kg/m2  SpO2 98%  Body mass index is 33.58 kg/(m^2).  GENERAL: vitals reviewed and listed above, alert, oriented, appears well hydrated and in no acute distress  NECK: no obvious masses on inspection palpation   LUNGS: clear to auscultation bilaterally, no wheezes, rales or rhonchi, good air movement  CV: HRRR, no clubbing cyanosis or  peripheral edema nl cap refill  Pulse 50 sitting standing 62    No g or m   MS: moves all extremities without noticeable focal  abnormality  PSYCH: pleasant and cooperative, no obvious depression or anxiety Lab Results  Component Value Date   WBC 4.0* 10/22/2011   HGB 13.3 04/28/2012   HCT 38.4  10/22/2011   PLT 158.0 10/22/2011   GLUCOSE 106* 10/22/2011   CHOL 196 04/16/2012   TRIG 100.0 04/16/2012   HDL 69.40 04/16/2012   LDLDIRECT 205.2 12/23/2011   LDLCALC 107* 04/16/2012   ALT 16 04/16/2012   AST 20 04/16/2012   NA 141 10/22/2011   K 4.0 10/22/2011   CL 104 10/22/2011   CREATININE 1.2 10/22/2011   BUN 11 10/22/2011   CO2 27 10/22/2011   TSH 1.14 10/22/2011   HGBA1C 5.9 10/22/2011    ASSESSMENT AND PLAN:  Discussed the following assessment and plan:  HYPERLIPIDEMIA - Much better readings apparently no side effects of the proximal call continue at this time  HYPERTENSION - Controlled  Other malaise and fatigue - Uncertain cause hemoglobin and blood sugar are adequate this is really seem like a side effect of pravastatin ;will follow - Plan: POCT hemoglobin, POCT glucose (manual entry)  Estrogen deficiency - Hasn't had any recent DEXA scan prescription given to patient she can schedule it with her mammogram when due or contact us for other order Reviewed sheet given by [facility] nurse questions answered as best possible -Patient advised to return or notify health care team  if symptoms worsen or persist or new concerns arise.  Patient Instructions  Labs are better stay on med for now.  Plan  Wellness visit in summer with labs at that visit.   Get dexa or bone density  Call breast center when you get your mammor so we can have results when you come in for your check up. In august September .  There are many causes of low energy  If not getting better or getting worse contact us for reevaluation.   You ar utd on tetanuse shot.    Neta Mends. Panosh M.D.

## 2012-04-28 NOTE — Patient Instructions (Addendum)
Labs are better stay on med for now.  Plan  Wellness visit in summer with labs at that visit.   Get dexa or bone density  Call breast center when you get your mammor so we can have results when you come in for your check up. In august September .  There are many causes of low energy  If not getting better or getting worse contact us for reevaluation.   You ar utd on tetanuse shot.

## 2012-05-04 ENCOUNTER — Other Ambulatory Visit: Payer: Self-pay | Admitting: Internal Medicine

## 2012-05-05 NOTE — Telephone Encounter (Signed)
Ok to refill x 2  

## 2012-05-05 NOTE — Telephone Encounter (Signed)
Last seen on 04/28/12.  Last filled on 02/28/12 #30 with 0 additional refills.  Has CPE scheduled for 10/27/12.  Please advise.  Thanks!!

## 2012-05-11 ENCOUNTER — Other Ambulatory Visit: Payer: Self-pay | Admitting: Internal Medicine

## 2012-05-21 ENCOUNTER — Other Ambulatory Visit: Payer: Self-pay | Admitting: Internal Medicine

## 2012-08-06 ENCOUNTER — Other Ambulatory Visit: Payer: Self-pay | Admitting: Internal Medicine

## 2012-08-06 ENCOUNTER — Other Ambulatory Visit: Payer: Self-pay

## 2012-08-06 DIAGNOSIS — E2839 Other primary ovarian failure: Secondary | ICD-10-CM

## 2012-08-06 DIAGNOSIS — Z1231 Encounter for screening mammogram for malignant neoplasm of breast: Secondary | ICD-10-CM

## 2012-09-08 ENCOUNTER — Ambulatory Visit
Admission: RE | Admit: 2012-09-08 | Discharge: 2012-09-08 | Disposition: A | Discharge status: Home / Self Care | Payer: Medicare Other | Source: Ambulatory Visit

## 2012-09-08 ENCOUNTER — Ambulatory Visit: Payer: Medicare Other

## 2012-09-08 ENCOUNTER — Ambulatory Visit
Admission: RE | Admit: 2012-09-08 | Discharge: 2012-09-08 | Disposition: A | Discharge status: Home / Self Care | Payer: Medicare Other | Source: Ambulatory Visit | Attending: Internal Medicine | Admitting: Internal Medicine

## 2012-09-08 DIAGNOSIS — Z1231 Encounter for screening mammogram for malignant neoplasm of breast: Secondary | ICD-10-CM

## 2012-09-08 DIAGNOSIS — E2839 Other primary ovarian failure: Secondary | ICD-10-CM

## 2012-09-27 NOTE — Progress Notes (Signed)
Quick Note:  Tell patient that x ray DEXA shows no increased risk Of fracture IE normal. ______

## 2012-09-29 ENCOUNTER — Encounter: Payer: Self-pay | Admitting: Family Medicine

## 2012-10-04 ENCOUNTER — Other Ambulatory Visit: Payer: Self-pay | Admitting: Internal Medicine

## 2012-10-05 NOTE — Telephone Encounter (Signed)
Last filled on 05/04/12 #30 with 1 additional refills Last seen on 04/28/12 and has future appt on 10/27/12. May I fill #30?

## 2012-10-06 ENCOUNTER — Other Ambulatory Visit: Payer: Self-pay | Admitting: Internal Medicine

## 2012-10-06 ENCOUNTER — Other Ambulatory Visit: Payer: Self-pay | Admitting: Family Medicine

## 2012-10-06 MED ORDER — HYDROCODONE-ACETAMINOPHEN 7.5-325 MG PO TABS: ORAL_TABLET | ORAL | Status: DC

## 2012-10-27 ENCOUNTER — Ambulatory Visit (INDEPENDENT_AMBULATORY_CARE_PROVIDER_SITE_OTHER): Payer: Medicare Other | Admitting: Internal Medicine

## 2012-10-27 ENCOUNTER — Encounter: Payer: Self-pay | Admitting: Internal Medicine

## 2012-10-27 VITALS — BP 120/72 | HR 58 | Temp 98.4°F | Ht 65.0 in | Wt 200.0 lb

## 2012-10-27 DIAGNOSIS — K219 Gastro-esophageal reflux disease without esophagitis: Secondary | ICD-10-CM

## 2012-10-27 DIAGNOSIS — M2550 Pain in unspecified joint: Secondary | ICD-10-CM

## 2012-10-27 DIAGNOSIS — Z23 Encounter for immunization: Secondary | ICD-10-CM

## 2012-10-27 DIAGNOSIS — E782 Mixed hyperlipidemia: Secondary | ICD-10-CM

## 2012-10-27 DIAGNOSIS — Z0000 Encounter for general adult medical examination without abnormal findings: Secondary | ICD-10-CM

## 2012-10-27 DIAGNOSIS — I10 Essential (primary) hypertension: Secondary | ICD-10-CM

## 2012-10-27 LAB — BASIC METABOLIC PANEL
BUN: 14 mg/dL (ref 6–23)
Chloride: 103 mEq/L (ref 96–112)
Glucose, Bld: 106 mg/dL — ABNORMAL HIGH (ref 70–99)
Potassium: 3.9 mEq/L (ref 3.5–5.1)

## 2012-10-27 LAB — CBC WITH DIFFERENTIAL/PLATELET
Eosinophils Relative: 0.6 % (ref 0.0–5.0)
HCT: 38.5 % (ref 36.0–46.0)
Lymphs Abs: 1.1 10*3/uL (ref 0.7–4.0)
MCV: 81.9 fl (ref 78.0–100.0)
Monocytes Absolute: 0.5 10*3/uL (ref 0.1–1.0)
Neutro Abs: 3.8 10*3/uL (ref 1.4–7.7)
Platelets: 180 10*3/uL (ref 150.0–400.0)
RDW: 15.7 % — ABNORMAL HIGH (ref 11.5–14.6)
WBC: 5.5 10*3/uL (ref 4.5–10.5)

## 2012-10-27 LAB — HEPATIC FUNCTION PANEL
ALT: 15 U/L (ref 0–35)
Albumin: 4.5 g/dL (ref 3.5–5.2)
Total Bilirubin: 0.9 mg/dL (ref 0.3–1.2)

## 2012-10-27 LAB — LIPID PANEL
Cholesterol: 220 mg/dL — ABNORMAL HIGH (ref 0–200)
Total CHOL/HDL Ratio: 3
Triglycerides: 107 mg/dL (ref 0.0–149.0)

## 2012-10-27 NOTE — Progress Notes (Signed)
Chief Complaint  Patient presents with  . Annual Exam    HPI: Patient comes in today for Preventive Medicare wellness visit . No major injuries, ed visits ,hospitalizations , new medications since last visit.  BP controlled  LIPIDS no se of med GERD ocntrolled  joinmt aches once or twice a week.  Takes hydro  Most;ly hands lateral  Left right handed no swelling  No redness rare use of  zanaflex   Hearing: good   Vision:  No limitations at present . Last eye check UTD  Safety:  Has smoke detector and wears seat belts.  No firearms. No excess sun exposure. Sees dentist regularly.  Falls: no  Advance directive :  Reviewed    Memory: Felt to be good  , no concern from her or her family.  Depression: No anhedonia unusual crying or depressive symptoms  Nutrition: Eats well balanced diet; adequate calcium and vitamin D. No swallowing chewing problems.  Injury: no major injuries in the last six months.  Other healthcare providers:  Reviewed today .  Social:  Lives alone  No pets.   Preventive parameters: up-to-date  Reviewed   ADLS:   There are no problems or need for assistance  driving, feeding, obtaining food, dressing, toileting and bathing, managing money using phone. She is independent.  EXERCISE/ HABITS  Per week   No tobacco    etoh  caffiene  About coffe per day.    ROS:  GEN/ HEENT: No fever, significant weight changes sweats headaches vision problems hearing changes, CV/ PULM; No chest pain shortness of breath cough, syncope,edema  change in exercise tolerance. GI /GU: No adominal pain, vomiting, change in bowel habits. No blood in the stool. No significant GU symptoms. SKIN/HEME: ,no acute skin rashes suspicious lesions or bleeding. No lymphadenopathy, nodules, masses.  NEURO/ PSYCH:  No neurologic signs such as weakness numbness. No depression anxiety. IMM/ Allergy: No unusual infections.  Allergy .   REST of 12 system review negative except as per  HPI   Past Medical History  Diagnosis Date  . Unspecified vitamin D deficiency 02/10/2007  . HYPERLIPIDEMIA 01/12/2007  . ANXIETY 11/11/2006  . HYPERTENSION 11/11/2006  . HOT FLASHES 04/07/2008  . UNSPECIFIED ARTHROPATHY SITE UNSPECIFIED 01/12/2007  . BACK PAIN, LUMBAR 07/11/2008  . LEG CRAMPS 01/12/2009  . FASTING HYPERGLYCEMIA 01/12/2007  . SYNCOPE, HX OF 02/10/2007  . History of varicella   . Arthritis   . 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 Normal stress echocardiogram 2012  . History of CT scan of chest   . Chest pain 06/12/2010    Ok stress echo has Gi sx also cw gerd.     Family History  Problem Relation Age of Onset  . Hypertension Mother   . Diabetes Mother   . Hypertension Sister   . Hypertension Brother   . Hyperlipidemia Brother     History   Social History  . Marital Status: Single    Spouse Name: N/A    Number of Children: N/A  . Years of Education: N/A   Occupational History  . Retire     [employer]   Social History Main Topics  . Smoking status: Never Smoker   . Smokeless tobacco: Never Used  . Alcohol Use: No  . Drug Use: No  . Sexual Activity: None   Other Topics Concern  . 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     Outpatient Encounter Prescriptions as of 10/27/2012  Medication Sig Dispense Refill  . Ascorbic Acid (VITAMIN C) 500 MG tablet Take 500 mg by mouth daily.        Marland Kitchen aspirin 81 MG tablet Take 81 mg by mouth daily.        . cholecalciferol (VITAMIN D) 1000 UNIT tablet Take 1,000 Units by mouth daily.        Marland Kitchen DEXILANT 60 MG capsule TAKE ONE CAPSULE BY MOUTH ONCE DAILY  30 capsule  5  . HYDROcodone-acetaminophen (NORCO) 7.5-325 MG per tablet TAKE ONE TO TWO TABLETS BY MOUTH ONCE DAILY AS NEEDED  30 tablet  0   . pravastatin (PRAVACHOL) 20 MG tablet TAKE ONE TABLET BY MOUTH EVERY DAY  30 tablet  5  . tiZANidine (ZANAFLEX) 4 MG tablet TAKE ONE TABLET BY MOUTH EVERY 8 HOURS AS NEEDED FOR BACK SPASMS  30 tablet  1  . losartan-hydrochlorothiazide (HYZAAR) 50-12.5 MG per tablet Take 1 tablet by mouth daily.  30 tablet  12   No facility-administered encounter medications on file as of 10/27/2012.    EXAM:  BP 120/72  Pulse 58  Temp(Src) 98.4 F (36.9 C) (Oral)  Ht 5\' 5"  (1.651 m)  Wt 200 lb (90.719 kg)  BMI 33.28 kg/m2  SpO2 98%  Body mass index is 33.28 kg/(m^2).  Physical Exam: Vital signs reviewed ZOX:WRUE is a well-developed well-nourished alert cooperative female   who appears stated age in no acute distress.  HEENT: normocephalic atraumatic , Eyes: PERRL EOM's full, conjunctiva clear, Nares: paten,t no deformity discharge or tenderness., Ears: no deformity EAC's clear TMs with normal landmarks. Mouth: clear OP, no lesions, edema.  Moist mucous membranes. Dentition in adequate repair. NECK: supple without masses, thyromegaly or bruits. CHEST/PULM:  Clear to auscultation and percussion breath sounds equal no wheeze , rales or rhonchi. No chest wall deformities or tenderness. Breast: normal by inspection . No dimpling, discharge, masses, tenderness or discharge .  CV: PMI is nondisplaced, S1 S2 no gallops, murmurs, rubs. Peripheral pulses are full without delay.No JVD .  ABDOMEN: Bowel sounds normal nontender  No guard or rebound, no hepato splenomegal no CVA tenderness.  No hernia. Extremtities:  No clubbing cyanosis or edema, no acute joint swelling or redness no focal atrophy NEURO:  Oriented x3, cranial nerves 3-12 appear to be intact, no obvious focal weakness,gait within normal limits no abnormal reflexes or asymmetrical SKIN: No acute rashes normal turgor, color, no bruising or petechiae. PSYCH: Oriented, good eye contact, no obvious depression anxiety, cognition and judgment appear  normal. LN: no cervical axillary inguinal adenopathy No noted deficits in memory, attention, and speech.   Lab Results  Component Value Date   WBC 4.0* 10/22/2011   HGB 13.3 04/28/2012   HCT 38.4 10/22/2011   PLT 158.0 10/22/2011   GLUCOSE 106* 10/22/2011   CHOL 196 04/16/2012   TRIG 100.0 04/16/2012   HDL 69.40 04/16/2012   LDLDIRECT 205.2 12/23/2011   LDLCALC 107* 04/16/2012   ALT 16 04/16/2012   AST 20 04/16/2012   NA 141 10/22/2011   K 4.0 10/22/2011   CL 104 10/22/2011   CREATININE 1.2 10/22/2011   BUN 11 10/22/2011   CO2 27 10/22/2011   TSH 1.14 10/22/2011   HGBA1C 5.9  10/22/2011    ASSESSMENT AND PLAN:  Discussed the following assessment and plan:  Visit for preventive health examination - flu vaccine today utd   HYPERLIPIDEMIA - no obv se of med initial ldl over 190 - Plan: Basic metabolic panel, CBC with Differential, Hepatic function panel, Lipid panel, TSH  HYPERTENSION - controlled  - Plan: Basic metabolic panel, CBC with Differential, Hepatic function panel, Lipid panel, TSH  Medicare annual wellness visit, subsequent - Plan: Basic metabolic panel, CBC with Differential, Hepatic function panel, Lipid panel, TSH  Need for prophylactic vaccination and inoculation against influenza - Plan: Basic metabolic panel, CBC with Differential, Hepatic function panel, Lipid panel, TSH, CANCELED: Flu Vaccine QUAD 36+ mos PF IM (Fluarix), CANCELED: Flu Vaccine QUAD 36+ mos PF IM (Fluarix)  GERD (gastroesophageal reflux disease) - on meds  - Plan: Basic metabolic panel, CBC with Differential, Hepatic function panel, Lipid panel, TSH  Multiple joint pain - moslty hand and arms ocass back  . monitor  fur if progressive earlier than planned  no alarm features  Patient Care Team: Madelin Headings, MD as PCP - General Iva Boop, MD (Gastroenterology) Girard Cooter as Referring Physician (Dermatology) Wendall Stade, MD (Cardiology)  Patient Instructions   Continue healthy life style .   Will notify you  of labs when available. ROV in 6-12 months depending on labs and how you are doing.     Neta Mends. Adalis Gatti M.D.  Health Maintenance  Topic Date Due  . Colonoscopy  07/10/2013  . Influenza Vaccine  09/25/2013  . Mammogram  09/09/2014  . Tetanus/tdap  10/17/2019  . Pneumococcal Polysaccharide Vaccine Age 73 And Over  Completed  . Zostavax  Completed   Health Maintenance Review

## 2012-10-27 NOTE — Patient Instructions (Signed)
  Continue healthy life style .  Will notify you  of labs when available. ROV in 6-12 months depending on labs and how you are doing.

## 2012-11-11 ENCOUNTER — Telehealth: Payer: Self-pay | Admitting: Internal Medicine

## 2012-11-11 MED ORDER — PRAVASTATIN SODIUM 20 MG PO TABS: ORAL_TABLET | ORAL | Status: DC

## 2012-11-11 NOTE — Telephone Encounter (Signed)
Pt called to request a 1 month refill of her pravastatin (PRAVACHOL) 20 MG tablet, be sent to [facility] on [address]. Please assist.

## 2012-11-11 NOTE — Telephone Encounter (Signed)
One month supply sent by e-scribe per the pt.

## 2012-11-17 ENCOUNTER — Other Ambulatory Visit: Payer: Self-pay | Admitting: Internal Medicine

## 2012-11-18 ENCOUNTER — Other Ambulatory Visit: Payer: Self-pay | Admitting: Family Medicine

## 2012-11-18 MED ORDER — DEXLANSOPRAZOLE 60 MG PO CPDR: DELAYED_RELEASE_CAPSULE | ORAL | Status: DC

## 2012-12-08 ENCOUNTER — Telehealth: Payer: Self-pay | Admitting: Internal Medicine

## 2012-12-08 NOTE — Telephone Encounter (Signed)
Pt needs rx for HYDROcodone-acetaminophen (NORCO) 7.5-325 MG per tablet  Pt also needs refill of pravastatin (PRAVACHOL) 20 MG tablet [facility]

## 2012-12-08 NOTE — Telephone Encounter (Signed)
Hydrocodone last filled on 10/06/12 #30 with 0 additional refills Has a future CPE scheduled for 10/29/2013. Last seen on 10/27/12 Please advise. Thanks!

## 2012-12-09 ENCOUNTER — Other Ambulatory Visit: Payer: Self-pay | Admitting: Family Medicine

## 2012-12-09 NOTE — Telephone Encounter (Signed)
Refill pravastatin for a year.   Hydrocodone ok to refill #30 but tell patient she will have to pick up rx .

## 2012-12-10 ENCOUNTER — Other Ambulatory Visit: Payer: Self-pay | Admitting: Family Medicine

## 2012-12-10 MED ORDER — PRAVASTATIN SODIUM 20 MG PO TABS: ORAL_TABLET | ORAL | Status: DC

## 2012-12-10 MED ORDER — HYDROCODONE-ACETAMINOPHEN 7.5-325 MG PO TABS: ORAL_TABLET | ORAL | Status: DC

## 2012-12-10 NOTE — Telephone Encounter (Signed)
Patient notified by telephone.  Will pick up hydrocodone at the front desk.

## 2012-12-10 NOTE — Telephone Encounter (Signed)
Left message on home/cell for the pt to return my call.  Will place rx for hydrocodone at the front desk.  Pravastatin sent to [facility] for 1 year.

## 2013-02-11 ENCOUNTER — Telehealth: Payer: Self-pay | Admitting: Internal Medicine

## 2013-02-11 NOTE — Telephone Encounter (Signed)
Pt needs new rx  hydrocodone . Pt has 2 pills left °

## 2013-02-12 ENCOUNTER — Encounter: Payer: Self-pay | Admitting: Family Medicine

## 2013-02-12 MED ORDER — HYDROCODONE-ACETAMINOPHEN 7.5-325 MG PO TABS: ORAL_TABLET | ORAL | Status: DC

## 2013-02-12 NOTE — Telephone Encounter (Signed)
Last filled by [facility] on 12/10/12 #30 with 0 additional refills Had a CPE on 10/27/12 and has a future CPE scheduled for 10/29/13 Please advise.  Thanks!

## 2013-02-12 NOTE — Telephone Encounter (Signed)
Done and printed

## 2013-02-12 NOTE — Telephone Encounter (Signed)
Left message at below listed number for the pt to pick up at the front desk. 

## 2013-03-19 ENCOUNTER — Encounter: Payer: Self-pay | Admitting: Internal Medicine

## 2013-04-02 ENCOUNTER — Telehealth: Payer: Self-pay | Admitting: Internal Medicine

## 2013-04-02 ENCOUNTER — Other Ambulatory Visit: Payer: Self-pay | Admitting: Family Medicine

## 2013-04-02 MED ORDER — HYDROCODONE-ACETAMINOPHEN 7.5-325 MG PO TABS: ORAL_TABLET | ORAL | Status: DC

## 2013-04-02 NOTE — Telephone Encounter (Signed)
Cynthia Estrada notified the patient to pick up rx at the front desk.

## 2013-04-02 NOTE — Telephone Encounter (Signed)
Last filled by you on 02/12/13 #30 with 0 additional refills Has a future CPE scheduled on 10/29/13. Last CPE on 10/27/12.  Please advise. Thanks!

## 2013-04-02 NOTE — Telephone Encounter (Signed)
Pt needs new rx hydrocodone °

## 2013-04-02 NOTE — Telephone Encounter (Signed)
Can refill x 1 

## 2013-04-02 NOTE — Telephone Encounter (Signed)
Left a message at the below listed number for the pt to return my call. 

## 2013-04-30 ENCOUNTER — Telehealth: Payer: Self-pay | Admitting: Internal Medicine

## 2013-04-30 MED ORDER — TIZANIDINE HCL 4 MG PO TABS: ORAL_TABLET | ORAL | Status: DC

## 2013-04-30 NOTE — Telephone Encounter (Signed)
Per [facility] note, pt uses occasionally.  Will send in one refill.  Pt notified.

## 2013-04-30 NOTE — Telephone Encounter (Signed)
Left message at the below listed number.  Need to see if the pt wants Flexeril.  Zanaflex is listed in her current medication list.

## 2013-04-30 NOTE — Telephone Encounter (Signed)
Pt would like a refill of cyclobenzaprine (FLEXERIL) 10 MG tablet Pt states she is having issues w/ lower back and rt thigh/aching [facility]

## 2013-06-18 ENCOUNTER — Telehealth: Payer: Self-pay | Admitting: Internal Medicine

## 2013-06-18 MED ORDER — HYDROCODONE-ACETAMINOPHEN 7.5-325 MG PO TABS: ORAL_TABLET | ORAL | Status: DC

## 2013-06-18 NOTE — Telephone Encounter (Signed)
One refill provided

## 2013-06-18 NOTE — Telephone Encounter (Signed)
Ok to refill x 1  

## 2013-06-18 NOTE — Telephone Encounter (Signed)
Pt req rx HYDROcodone-acetaminophen (NORCO) 7.5-325 MG per tablet ° °

## 2013-06-18 NOTE — Telephone Encounter (Signed)
Scripts printed. Left message to advise pt ready for pick up

## 2013-06-18 NOTE — Telephone Encounter (Signed)
Last OV 10/27/12. Last filled 04/02/2013

## 2013-07-02 ENCOUNTER — Encounter: Payer: Self-pay | Admitting: Internal Medicine

## 2013-07-20 ENCOUNTER — Encounter: Payer: Self-pay | Admitting: Internal Medicine

## 2013-08-09 ENCOUNTER — Other Ambulatory Visit: Payer: Self-pay

## 2013-08-09 DIAGNOSIS — Z1231 Encounter for screening mammogram for malignant neoplasm of breast: Secondary | ICD-10-CM

## 2013-09-02 ENCOUNTER — Telehealth: Payer: Self-pay | Admitting: Internal Medicine

## 2013-09-02 MED ORDER — HYDROCODONE-ACETAMINOPHEN 7.5-325 MG PO TABS: ORAL_TABLET | ORAL | Status: DC

## 2013-09-02 NOTE — Telephone Encounter (Signed)
Patient notified to pick up at the front desk. 

## 2013-09-02 NOTE — Telephone Encounter (Signed)
Last filled on 06/18/13 #30 with 0 additional refills. Last seen on 10/27/13 and has an upcoming CPE on 10/29/2013. Please advise.  Thanks!

## 2013-09-02 NOTE — Telephone Encounter (Signed)
Ok to refill x 1  

## 2013-09-02 NOTE — Telephone Encounter (Signed)
Pt is requesting re-fill on HYDROcodone-acetaminophen (NORCO) 7.5-325 MG per tablet

## 2013-09-04 ENCOUNTER — Other Ambulatory Visit: Payer: Self-pay | Admitting: Internal Medicine

## 2013-09-07 ENCOUNTER — Ambulatory Visit (AMBULATORY_SURGERY_CENTER): Payer: Self-pay

## 2013-09-07 VITALS — Ht 65.0 in | Wt 225.6 lb

## 2013-09-07 DIAGNOSIS — Z860100 Personal history of colon polyps, unspecified: Secondary | ICD-10-CM

## 2013-09-07 MED ORDER — SUPREP BOWEL PREP KIT 17.5-3.13-1.6 GM/177ML PO SOLN: 1.0000 | Freq: Once | ORAL | Status: DC

## 2013-09-07 NOTE — Telephone Encounter (Signed)
Ok x 1

## 2013-09-07 NOTE — Progress Notes (Signed)
No allergies to eggs or soy No home oxygen No past problems with anesthesia No diet/weight loss meds  No email 

## 2013-09-08 NOTE — Telephone Encounter (Signed)
Sent to the pharmacy by e-scribe. 

## 2013-09-10 ENCOUNTER — Ambulatory Visit
Admission: RE | Admit: 2013-09-10 | Discharge: 2013-09-10 | Disposition: A | Discharge status: Home / Self Care | Payer: Medicare Other | Source: Ambulatory Visit

## 2013-09-10 DIAGNOSIS — Z1231 Encounter for screening mammogram for malignant neoplasm of breast: Secondary | ICD-10-CM

## 2013-09-16 ENCOUNTER — Encounter: Payer: Self-pay | Admitting: Internal Medicine

## 2013-09-21 ENCOUNTER — Ambulatory Visit (AMBULATORY_SURGERY_CENTER): Payer: Medicare Other | Admitting: Internal Medicine

## 2013-09-21 ENCOUNTER — Encounter: Payer: Self-pay | Admitting: Internal Medicine

## 2013-09-21 VITALS — BP 146/65 | HR 47 | Temp 97.6°F | Resp 15 | Ht 67.0 in | Wt 225.0 lb

## 2013-09-21 DIAGNOSIS — D126 Benign neoplasm of colon, unspecified: Secondary | ICD-10-CM

## 2013-09-21 DIAGNOSIS — K648 Other hemorrhoids: Secondary | ICD-10-CM

## 2013-09-21 DIAGNOSIS — Z86010 Personal history of colonic polyps: Secondary | ICD-10-CM

## 2013-09-21 MED ORDER — SODIUM CHLORIDE 0.9 % IV SOLN: 500.0000 mL | INTRAVENOUS | Status: DC

## 2013-09-21 NOTE — Op Note (Signed)
[facility]  [address]. [place], [address]   COLONOSCOPY PROCEDURE REPORT  PATIENT: Cynthia Estrada, Cynthia Estrada  MR#: [id number] BIRTHDATE: [birth year] , 89  yrs. old GENDER: Female ENDOSCOPIST: Gatha Mayer, MD, [facility] PROCEDURE DATE:  09/21/2013 PROCEDURE:   Colonoscopy with biopsy First Screening Colonoscopy - Avg.  risk and is 50 yrs.  old or older - No.  Prior Negative Screening - Now for repeat screening. N/A  History of Adenoma - Now for follow-up colonoscopy & has been > or = to 3 yrs.  Yes hx of adenoma.  Has been 3 or more years since last colonoscopy.  Polyps Removed Today? Yes. ASA CLASS:   Class III INDICATIONS:Patient's personal history of adenomatous colon polyps.  MEDICATIONS: propofol (Diprivan) 250mg  IV, MAC sedation, administered by CRNA, and These medications were titrated to patient response per physician's verbal order  DESCRIPTION OF PROCEDURE:   After the risks benefits and alternatives of the procedure were thoroughly explained, informed consent was obtained.  A digital rectal exam revealed no abnormalities of the rectum.   The LB [id number]  endoscope was introduced through the anus and advanced to the surgical anastomosis. No adverse events experienced.   The quality of the prep was excellent using Suprep  The instrument was then slowly withdrawn as the colon was fully examined.      COLON FINDINGS: A diminutive sessile polyp was found in the descending colon.  A polypectomy was performed with cold forceps. The resection was complete and the polyp tissue was completely retrieved.   There was evidence of a prior ileocolonic surgical anastomosis The finding was in the right colon.   The colon mucosa was otherwise normal.  Retroflexed views revealed internal/external hemorrhoids. The time to cecum=2 minutes 33 seconds.  Withdrawal time=7 minutes 51 seconds.  The scope was withdrawn and the procedure completed. COMPLICATIONS: There  were no complications.  ENDOSCOPIC IMPRESSION: 1.   Diminutive sessile polyp was found in the descending colon; polypectomy was performed with cold forceps 2.   There was evidence of a prior ileocolonic surgical anastomosis in the right colon 3.   The colon mucosa was otherwise normal - excellent prep - hx villous adenomas and surgical resection/colon polypectomy last 2012  RECOMMENDATIONS: 1.  Timing of repeat colonoscopy will be determined by pathology findings. Likely 5 years 2.   May be seen in office to treat hemorrhoids if they are bothering her.   eSigned:  Gatha Mayer, MD, [facility] 09/21/2013 11:15 AM   cc: The Patient

## 2013-09-21 NOTE — Progress Notes (Signed)
Called to room to assist during endoscopic procedure.  Patient ID and intended procedure confirmed with present staff. Received instructions for my participation in the procedure from the performing physician.  

## 2013-09-21 NOTE — Progress Notes (Signed)
Report to PACU, RN, vss, BBS= Clear.  

## 2013-09-21 NOTE — Patient Instructions (Addendum)
I found and removed one small polyp today. Do not worry about this.  You have internal/external hemorrhoids. If you have hemorrhoid problems (swelling, itching, bleeding) I am able to treat those with an in-office procedure. If you like, please call my office at [phone] to schedule an appointment and I can evaluate you further.  I will let you know pathology results and when to have another routine colonoscopy by mail.  I appreciate the opportunity to care for you. Gatha Mayer, MD, FACG  YOU HAD AN ENDOSCOPIC PROCEDURE TODAY AT [facility]: Refer to the procedure report that was given to you for any specific questions about what was found during the examination.  If the procedure report does not answer your questions, please call your gastroenterologist to clarify.  If you requested that your care partner not be given the details of your procedure findings, then the procedure report has been included in a sealed envelope for you to review at your convenience later.  YOU SHOULD EXPECT: Some feelings of bloating in the abdomen. Passage of more gas than usual.  Walking can help get rid of the air that was put into your GI tract during the procedure and reduce the bloating. If you had a lower endoscopy (such as a colonoscopy or flexible sigmoidoscopy) you may notice spotting of blood in your stool or on the toilet paper. If you underwent a bowel prep for your procedure, then you may not have a normal bowel movement for a few days.  DIET: Your first meal following the procedure should be a light meal and then it is ok to progress to your normal diet.  A half-sandwich or bowl of soup is an example of a good first meal.  Heavy or fried foods are harder to digest and may make you feel nauseous or bloated.  Likewise meals heavy in dairy and vegetables can cause extra gas to form and this can also increase the bloating.  Drink plenty of fluids but you should avoid alcoholic beverages for 24  hours.  ACTIVITY: Your care partner should take you home directly after the procedure.  You should plan to take it easy, moving slowly for the rest of the day.  You can resume normal activity the day after the procedure however you should NOT DRIVE or use heavy machinery for 24 hours (because of the sedation medicines used during the test).    SYMPTOMS TO REPORT IMMEDIATELY: A gastroenterologist can be reached at any hour.  During normal business hours, 8:30 AM to 5:00 PM Monday through Friday, call [phone].  After hours and on weekends, please call the GI answering service at [phone] who will take a message and have the physician on call contact you.   Following lower endoscopy (colonoscopy or flexible sigmoidoscopy):  Excessive amounts of blood in the stool  Significant tenderness or worsening of abdominal pains  Swelling of the abdomen that is new, acute  Fever of 100F or higher  FOLLOW UP: If any biopsies were taken you will be contacted by phone or by letter within the next 1-3 weeks.  Call your gastroenterologist if you have not heard about the biopsies in 3 weeks.  Our staff will call the home number listed on your records the next business day following your procedure to check on you and address any questions or concerns that you may have at that time regarding the information given to you following your procedure. This is a courtesy call and  so if there is no answer at the home number and we have not heard from you through the emergency physician on call, we will assume that you have returned to your regular daily activities without incident.  SIGNATURES/CONFIDENTIALITY: You and/or your care partner have signed paperwork which will be entered into your electronic medical record.  These signatures attest to the fact that that the information above on your After Visit Summary has been reviewed and is understood.  Full responsibility of the confidentiality of this discharge  information lies with you and/or your care-partner.  Recommendations Next colonoscopy determined by pathology results, likely 5 years.  Hemorrhoid treatment if symptomatic.

## 2013-09-22 ENCOUNTER — Telehealth: Payer: Self-pay

## 2013-09-22 NOTE — Telephone Encounter (Signed)
  Follow up Call-  Call back number 09/21/2013  Post procedure Call Back phone  # [phone]  Permission to leave phone message Yes     Patient questions:  Do you have a fever, pain , or abdominal swelling? No. Pain Score  0 *  Have you tolerated food without any problems? Yes.    Have you been able to return to your normal activities? Yes.    Do you have any questions about your discharge instructions: Diet   No. Medications  No. Follow up visit  No.  Do you have questions or concerns about your Care? No.  Actions: * If pain score is 4 or above: No action needed, pain <4.

## 2013-09-30 ENCOUNTER — Encounter: Payer: Self-pay | Admitting: Internal Medicine

## 2013-09-30 NOTE — Progress Notes (Signed)
Quick Note:  Diminutive adenoma Repeat colon 2020 ______

## 2013-10-19 ENCOUNTER — Telehealth: Payer: Self-pay | Admitting: Internal Medicine

## 2013-10-19 MED ORDER — LOSARTAN POTASSIUM-HCTZ 50-12.5 MG PO TABS: ORAL_TABLET | ORAL | Status: DC

## 2013-10-19 MED ORDER — DEXLANSOPRAZOLE 60 MG PO CPDR: DELAYED_RELEASE_CAPSULE | ORAL | Status: DC

## 2013-10-19 NOTE — Telephone Encounter (Signed)
Sent to the pharmacy by e-scribe. 

## 2013-10-19 NOTE — Telephone Encounter (Signed)
Pt request refill of the following:  losartan-hydrochlorothiazide (HYZAAR) 50-12.5 MG per tablet, dexlansoprazole (DEXILANT) 60 MG capsule     Phamacy: [facility]

## 2013-10-29 ENCOUNTER — Ambulatory Visit (INDEPENDENT_AMBULATORY_CARE_PROVIDER_SITE_OTHER): Payer: Medicare Other | Admitting: Internal Medicine

## 2013-10-29 ENCOUNTER — Encounter: Payer: Self-pay | Admitting: Internal Medicine

## 2013-10-29 VITALS — BP 118/80 | Temp 98.4°F | Ht 65.25 in | Wt 225.0 lb

## 2013-10-29 DIAGNOSIS — R209 Unspecified disturbances of skin sensation: Secondary | ICD-10-CM

## 2013-10-29 DIAGNOSIS — G479 Sleep disorder, unspecified: Secondary | ICD-10-CM

## 2013-10-29 DIAGNOSIS — Z23 Encounter for immunization: Secondary | ICD-10-CM

## 2013-10-29 DIAGNOSIS — Z0000 Encounter for general adult medical examination without abnormal findings: Secondary | ICD-10-CM

## 2013-10-29 DIAGNOSIS — I10 Essential (primary) hypertension: Secondary | ICD-10-CM

## 2013-10-29 DIAGNOSIS — R739 Hyperglycemia, unspecified: Secondary | ICD-10-CM

## 2013-10-29 DIAGNOSIS — E2839 Other primary ovarian failure: Secondary | ICD-10-CM

## 2013-10-29 DIAGNOSIS — R7309 Other abnormal glucose: Secondary | ICD-10-CM

## 2013-10-29 DIAGNOSIS — Z860100 Personal history of colon polyps, unspecified: Secondary | ICD-10-CM

## 2013-10-29 DIAGNOSIS — R202 Paresthesia of skin: Secondary | ICD-10-CM

## 2013-10-29 DIAGNOSIS — E782 Mixed hyperlipidemia: Secondary | ICD-10-CM

## 2013-10-29 LAB — CBC WITH DIFFERENTIAL/PLATELET
BASOS ABS: 0 10*3/uL (ref 0.0–0.1)
BASOS PCT: 0.5 % (ref 0.0–3.0)
EOS PCT: 1.5 % (ref 0.0–5.0)
Eosinophils Absolute: 0.1 10*3/uL (ref 0.0–0.7)
HCT: 38.2 % (ref 36.0–46.0)
HEMOGLOBIN: 12.5 g/dL (ref 12.0–15.0)
LYMPHS PCT: 25.5 % (ref 12.0–46.0)
Lymphs Abs: 1.1 10*3/uL (ref 0.7–4.0)
MCHC: 32.8 g/dL (ref 30.0–36.0)
MCV: 82.6 fl (ref 78.0–100.0)
Monocytes Absolute: 0.6 10*3/uL (ref 0.1–1.0)
Monocytes Relative: 14.6 % — ABNORMAL HIGH (ref 3.0–12.0)
NEUTROS ABS: 2.6 10*3/uL (ref 1.4–7.7)
Neutrophils Relative %: 57.9 % (ref 43.0–77.0)
Platelets: 182 10*3/uL (ref 150.0–400.0)
RBC: 4.62 Mil/uL (ref 3.87–5.11)
RDW: 15.7 % — ABNORMAL HIGH (ref 11.5–15.5)
WBC: 4.4 10*3/uL (ref 4.0–10.5)

## 2013-10-29 LAB — LIPID PANEL
Cholesterol: 225 mg/dL — ABNORMAL HIGH (ref 0–200)
HDL: 66.9 mg/dL (ref >39.00)
LDL Cholesterol: 130 mg/dL — ABNORMAL HIGH (ref 0–99)
NonHDL: 158.1
Total CHOL/HDL Ratio: 3
Triglycerides: 142 mg/dL (ref 0.0–149.0)
VLDL: 28.4 mg/dL (ref 0.0–40.0)

## 2013-10-29 LAB — HEPATIC FUNCTION PANEL
ALK PHOS: 66 U/L (ref 39–117)
ALT: 20 U/L (ref 0–35)
AST: 23 U/L (ref 0–37)
Albumin: 4.2 g/dL (ref 3.5–5.2)
Bilirubin, Direct: 0.2 mg/dL (ref 0.0–0.3)
TOTAL PROTEIN: 7 g/dL (ref 6.0–8.3)
Total Bilirubin: 1 mg/dL (ref 0.2–1.2)

## 2013-10-29 LAB — TSH: TSH: 1.09 u[IU]/mL (ref 0.35–4.50)

## 2013-10-29 LAB — BASIC METABOLIC PANEL
BUN: 11 mg/dL (ref 6–23)
CALCIUM: 9.5 mg/dL (ref 8.4–10.5)
CO2: 29 mEq/L (ref 19–32)
Chloride: 105 mEq/L (ref 96–112)
Creatinine, Ser: 1.2 mg/dL (ref 0.4–1.2)
GFR: 57.73 mL/min — AB (ref >60.00)
Glucose, Bld: 99 mg/dL (ref 70–99)
POTASSIUM: 4.3 meq/L (ref 3.5–5.1)
SODIUM: 139 meq/L (ref 135–145)

## 2013-10-29 LAB — T4, FREE: Free T4: 0.95 ng/dL (ref 0.60–1.60)

## 2013-10-29 LAB — HEMOGLOBIN A1C: Hgb A1c MFr Bld: 6.1 % (ref 4.6–6.5)

## 2013-10-29 NOTE — Progress Notes (Signed)
Pre visit review using our clinic review tool, if applicable. No additional management support is needed unless otherwise documented below in the visit note.  Chief Complaint  Patient presents with  . Medicare Wellness  . Hypertension  . Hyperlipidemia    HPI: Patient comes in today for Preventive Medicare wellness visit . No major injuries, ed visits ,hospitalizations , new medications since last visit.  colonoscopy  One polyp and hemorrhoids. 5 year recall   [place] [facility]   Nurse .  Said every thing ok.   Pain med once or twice a month   For arthritis   Ht  In range   Lipid taking med   New concerns :  Tingling toes and fingers. Newer phenom   Not all the time no assoc weakness falling or neuro sx   sleeop awakens around 2 am off an on ? No reason  No osa sx or pain as cause  No etoh  Health Maintenance  Topic Date Due  . Influenza Vaccine  09/25/2013  . Mammogram  09/11/2015  . Colonoscopy  09/22/2018  . Tetanus/tdap  10/17/2019  . Pneumococcal Polysaccharide Vaccine Age 69 And Over  Completed  . Zostavax  Completed   Health Maintenance Review LIFESTYLE:  Exercise:  Not recently  Except work   parttime 20 + hours  Tobacco/ETS: no Alcohol: no Sugar beverages:coke every other day.  Sleep:  Not enough  Wakens 2 am .   Drug use: no Bone density:  utd Colonoscopy:  5 year    Hearing:  Ok   Vision:  No limitations at present . Last eye check UTD  Safety:  Has smoke detector and wears seat belts.  No firearms. No excess sun exposure. Sees dentist regularly.  Falls:  No   Advance directive :  Reviewed  Has one.  Memory: Felt to be good  , no concern  Screened but [facility] nurse?  Depression: No anhedonia unusual crying or depressive symptoms  Nutrition: Eats well balanced diet; adequate calcium and vitamin D. No swallowing chewing problems. Not mild except in cereal  Injury: no major injuries in the last six months.  Other healthcare providers:   Reviewed today .  Social:  Lives  Alone . No pets.   Preventive parameters: up-to-date  Reviewed   ADLS:   There are no problems or need for assistance  driving, feeding, obtaining food, dressing, toileting and bathing, managing money using phone. She is independent..     ROS:  GEN/ HEENT: No fever, significant weight changes sweats headaches vision problems  Glasses hearing changes, ocass flushes face  No fever  Comes and goes no assoc sx  CV/ PULM; No chest pain shortness of breath cough, syncope,edema  change in exercise tolerance. GI /GU: No adominal pain, vomiting, change in bowel habits. No blood in the stool. No significant GU symptoms. SKIN/HEME: ,no acute skin rashes suspicious lesions or bleeding. No lymphadenopathy, nodules, masses.  NEURO/ PSYCH:  See hpi. No depression anxiety. IMM/ Allergy: No unusual infections.  Allergy .   REST of 12 system review negative except as per HPI   Past Medical History  Diagnosis Date  . Unspecified vitamin D deficiency 02/10/2007  . HYPERLIPIDEMIA 01/12/2007  . ANXIETY 11/11/2006  . HYPERTENSION 11/11/2006  . HOT FLASHES 04/07/2008  . UNSPECIFIED ARTHROPATHY SITE UNSPECIFIED 01/12/2007  . BACK PAIN, LUMBAR 07/11/2008  . LEG CRAMPS 01/12/2009  . FASTING HYPERGLYCEMIA 01/12/2007  . SYNCOPE, HX OF 02/10/2007  . History of varicella   .  Arthritis   . 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 Normal stress echocardiogram 2012  . History of CT scan of chest   . Chest pain 06/12/2010    Ok stress echo has Gi sx also cw gerd.     Family History  Problem Relation Age of Onset  . Hypertension Mother   . Diabetes Mother   . Hypertension Sister   . Hypertension Brother   . Hyperlipidemia Brother   . Colon cancer Neg Hx   . Pancreatic cancer Neg Hx   . Rectal cancer Neg Hx   . Stomach cancer Neg Hx     History   Social History  . Marital Status: Single    Spouse Name: N/A    Number of Children: N/A  .  Years of Education: N/A   Occupational History  . [employer]   Social History Main Topics  . Smoking status: Never Smoker   . Smokeless tobacco: Never Used  . Alcohol Use: No  . Drug Use: No  . Sexual Activity: None   Other Topics Concern  . 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     Outpatient Encounter Prescriptions as of 10/29/2013  Medication Sig  . Ascorbic Acid (VITAMIN C) 500 MG tablet Take 500 mg by mouth daily.    Marland Kitchen aspirin 81 MG tablet Take 81 mg by mouth daily.    . cholecalciferol (VITAMIN D) 1000 UNIT tablet Take 1,000 Units by mouth daily.    Marland Kitchen dexlansoprazole (DEXILANT) 60 MG capsule TAKE ONE CAPSULE BY MOUTH ONCE DAILY  . HYDROcodone-acetaminophen (NORCO) 7.5-325 MG per tablet TAKE ONE TO TWO TABLETS BY MOUTH ONCE DAILY AS NEEDED  . losartan-hydrochlorothiazide (HYZAAR) 50-12.5 MG per tablet TAKE ONE TABLET BY MOUTH EVERY DAY  . pravastatin (PRAVACHOL) 20 MG tablet TAKE ONE TABLET BY MOUTH EVERY DAY  . tiZANidine (ZANAFLEX) 4 MG tablet TAKE ONE TABLET BY MOUTH EVERY 8 HOURS AS NEEDED FOR  BACK  SPASMS    EXAM:  BP 118/80  Temp(Src) 98.4 F (36.9 C) (Oral)  Ht 5' 5.25" (1.657 m)  Wt 225 lb (102.059 kg)  BMI 37.17 kg/m2  Body mass index is 37.17 kg/(m^2).  Physical Exam: Vital signs reviewed DZH:GDJM is a well-developed well-nourished alert cooperative   who appears stated age in no acute distress.  HEENT: normocephalic atraumatic , Eyes: PERRL EOM's full, conjunctiva clear, Nares: paten,t no deformity discharge or tenderness., Ears: no deformity EAC's clear TMs with normal landmarks. Mouth: clear OP, no lesions, edema.  Moist mucous membranes. Dentition in adequate repair. NECK: supple without masses, thyromegaly or bruits. CHEST/PULM:  Clear to  auscultation and percussion breath sounds equal no wheeze , rales or rhonchi. No chest wall deformities or tenderness. Breast: normal by inspection . No dimpling, discharge, masses, tenderness or discharge . CV: PMI is nondisplaced, S1 S2 no gallops, murmurs, rubs. Peripheral pulses are full without delay.No JVD .  ABDOMEN: Bowel sounds normal nontender  No guard or rebound, no hepato splenomegal no CVA tenderness.  No hernia. Extremtities:  No clubbing cyanosis or edema, no acute joint swelling or redness no focal atrophy oa changes  NEURO:  Oriented x3, cranial nerves  3-12 appear to be intact, no obvious focal weakness,gait within normal limits no abnormal reflexes or asymmetrical no obv deficit  SKIN: No acute rashes normal turgor, color, no bruising or petechiae. PSYCH: Oriented, good eye contact, no obvious depression anxiety, cognition and judgment appear normal. LN: no cervical axillary inguinal adenopathy No noted deficits in memory, attention, and speech.   Lab Results  Component Value Date   WBC 5.5 10/27/2012   HGB 12.8 10/27/2012   HCT 38.5 10/27/2012   PLT 180.0 10/27/2012   GLUCOSE 106* 10/27/2012   CHOL 220* 10/27/2012   TRIG 107.0 10/27/2012   HDL 79.10 10/27/2012   LDLDIRECT 131.2 10/27/2012   LDLCALC 107* 04/16/2012   ALT 15 10/27/2012   AST 22 10/27/2012   NA 136 10/27/2012   K 3.9 10/27/2012   CL 103 10/27/2012   CREATININE 1.1 10/27/2012   BUN 14 10/27/2012   CO2 28 10/27/2012   TSH 1.05 10/27/2012   HGBA1C 5.9 10/22/2011    ASSESSMENT AND PLAN:  Discussed the following assessment and plan:  Visit for preventive health examination  Medicare annual wellness visit, subsequent - Plan: Basic metabolic panel, CBC with Differential, Hepatic function panel, Lipid panel, TSH  Unspecified essential hypertension - controlled.  - Plan: Basic metabolic panel, CBC with Differential, Hepatic function panel, Lipid panel, TSH  Mixed hyperlipidemia - lab today  - Plan: Basic metabolic panel, CBC with  Differential, Hepatic function panel, Lipid panel, TSH, T4, free  Estrogen deficiency - Plan: Basic metabolic panel, CBC with Differential, Hepatic function panel, Lipid panel, TSH  Hyperglycemia - Plan: Basic metabolic panel, CBC with Differential, Hepatic function panel, Lipid panel, TSH, Hemoglobin A1c, T4, free  Need for vaccination with 13-polyvalent pneumococcal conjugate vaccine - Plan: Pneumococcal conjugate vaccine 13-valent  Tingling in extremities - normal exam check U7O metabolic and fu if progressive and persitent  - Plan: Hemoglobin A1c, T4, free  Personal history of tubulovillous adenomas of the colon  Sleep disturbance, unspecified - wakenings  reviewed hugien trial melatonin counseled  To get flu vaccine when in stock  Labs done today not really fasting sugar in coffee  Patient Care Team: Burnis Medin, MD as PCP - General Gatha Mayer, MD (Gastroenterology) Addison Lank, MD as Referring Physician (Dermatology) Josue Hector, MD (Cardiology)  Patient Instructions  Will notify you  of labs when available. Try melatonin 2-4 hours pre sleep and attend to sleep hygiene. If the tingling is  persistent or progressive then return for recheck.   Healthy lifestyle includes : At least 150 minutes of exercise weeks  , weight at healthy levels, which is usually   BMI 19-25. Avoid trans fats and processed foods;  Increase fresh fruits and veges to 5 servings per day. And avoid sweet beverages including tea and juice. Mediterranean diet with olive oil and nuts have been noted to be heart and brain healthy . Avoid tobacco products . Limit  alcohol to  7 per week for women and 14 servings for men.  Get adequate sleep . Wear seat belts . Don't text and drive .   Insomnia Insomnia is frequent trouble falling and/or staying asleep. Insomnia can be a long term problem or a short term problem. Both are common. Insomnia can be a short term problem when the wakefulness is related to a  certain stress or worry. Long term insomnia is often related to ongoing stress during waking hours and/or poor sleeping habits. Overtime, sleep deprivation itself can make the problem  worse. Every little thing feels more severe because you are overtired and your ability to cope is decreased. CAUSES   Stress, anxiety, and depression.  Poor sleeping habits.  Distractions such as TV in the bedroom.  Naps close to bedtime.  Engaging in emotionally charged conversations before bed.  Technical reading before sleep.  Alcohol and other sedatives. They may make the problem worse. They can hurt normal sleep patterns and normal dream activity.  Stimulants such as caffeine for several hours prior to bedtime.  Pain syndromes and shortness of breath can cause insomnia.  Exercise late at night.  Changing time zones may cause sleeping problems (jet lag). It is sometimes helpful to have someone observe your sleeping patterns. They should look for periods of not breathing during the night (sleep apnea). They should also look to see how long those periods last. If you live alone or observers are uncertain, you can also be observed at a sleep clinic where your sleep patterns will be professionally monitored. Sleep apnea requires a checkup and treatment. Give your caregivers your medical history. Give your caregivers observations your family has made about your sleep.  SYMPTOMS   Not feeling rested in the morning.  Anxiety and restlessness at bedtime.  Difficulty falling and staying asleep. TREATMENT   Your caregiver may prescribe treatment for an underlying medical disorders. Your caregiver can give advice or help if you are using alcohol or other drugs for self-medication. Treatment of underlying problems will usually eliminate insomnia problems.  Medications can be prescribed for short time use. They are generally not recommended for lengthy use.  Over-the-counter sleep medicines are not  recommended for lengthy use. They can be habit forming.  You can promote easier sleeping by making lifestyle changes such as:  Using relaxation techniques that help with breathing and reduce muscle tension.  Exercising earlier in the day.  Changing your diet and the time of your last meal. No night time snacks.  Establish a regular time to go to bed.  Counseling can help with stressful problems and worry.  Soothing music and white noise may be helpful if there are background noises you cannot remove.  Stop tedious detailed work at least one hour before bedtime. HOME CARE INSTRUCTIONS   Keep a diary. Inform your caregiver about your progress. This includes any medication side effects. See your caregiver regularly. Take note of:  Times when you are asleep.  Times when you are awake during the night.  The quality of your sleep.  How you feel the next day. This information will help your caregiver care for you.  Get out of bed if you are still awake after 15 minutes. Read or do some quiet activity. Keep the lights down. Wait until you feel sleepy and go back to bed.  Keep regular sleeping and waking hours. Avoid naps.  Exercise regularly.  Avoid distractions at bedtime. Distractions include watching television or engaging in any intense or detailed activity like attempting to balance the household checkbook.  Develop a bedtime ritual. Keep a familiar routine of bathing, brushing your teeth, climbing into bed at the same time each night, listening to soothing music. Routines increase the success of falling to sleep faster.  Use relaxation techniques. This can be using breathing and muscle tension release routines. It can also include visualizing peaceful scenes. You can also help control troubling or intruding thoughts by keeping your mind occupied with boring or repetitive thoughts like the old concept of counting sheep. You can make  it more creative like imagining planting one  beautiful flower after another in your backyard garden.  During your day, work to eliminate stress. When this is not possible use some of the previous suggestions to help reduce the anxiety that accompanies stressful situations. MAKE SURE YOU:   Understand these instructions.  Will watch your condition.  Will get help right away if you are not doing well or get worse. Document Released: 02/09/2000 Document Revised: 05/06/2011 Document Reviewed: 03/11/2007 [facility] Patient Information 2015 [place], [place]. This information is not intended to replace advice given to you by your health care provider. Make sure you discuss any questions you have with your health care provider.       Standley Brooking. Panosh M.D.

## 2013-10-29 NOTE — Patient Instructions (Signed)
Will notify you  of labs when available. Try melatonin 2-4 hours pre sleep and attend to sleep hygiene. If the tingling is  persistent or progressive then return for recheck.   Healthy lifestyle includes : At least 150 minutes of exercise weeks  , weight at healthy levels, which is usually   BMI 19-25. Avoid trans fats and processed foods;  Increase fresh fruits and veges to 5 servings per day. And avoid sweet beverages including tea and juice. Mediterranean diet with olive oil and nuts have been noted to be heart and brain healthy . Avoid tobacco products . Limit  alcohol to  7 per week for women and 14 servings for men.  Get adequate sleep . Wear seat belts . Don't text and drive .   Insomnia Insomnia is frequent trouble falling and/or staying asleep. Insomnia can be a long term problem or a short term problem. Both are common. Insomnia can be a short term problem when the wakefulness is related to a certain stress or worry. Long term insomnia is often related to ongoing stress during waking hours and/or poor sleeping habits. Overtime, sleep deprivation itself can make the problem worse. Every little thing feels more severe because you are overtired and your ability to cope is decreased. CAUSES   Stress, anxiety, and depression.  Poor sleeping habits.  Distractions such as TV in the bedroom.  Naps close to bedtime.  Engaging in emotionally charged conversations before bed.  Technical reading before sleep.  Alcohol and other sedatives. They may make the problem worse. They can hurt normal sleep patterns and normal dream activity.  Stimulants such as caffeine for several hours prior to bedtime.  Pain syndromes and shortness of breath can cause insomnia.  Exercise late at night.  Changing time zones may cause sleeping problems (jet lag). It is sometimes helpful to have someone observe your sleeping patterns. They should look for periods of not breathing during the night (sleep  apnea). They should also look to see how long those periods last. If you live alone or observers are uncertain, you can also be observed at a sleep clinic where your sleep patterns will be professionally monitored. Sleep apnea requires a checkup and treatment. Give your caregivers your medical history. Give your caregivers observations your family has made about your sleep.  SYMPTOMS   Not feeling rested in the morning.  Anxiety and restlessness at bedtime.  Difficulty falling and staying asleep. TREATMENT   Your caregiver may prescribe treatment for an underlying medical disorders. Your caregiver can give advice or help if you are using alcohol or other drugs for self-medication. Treatment of underlying problems will usually eliminate insomnia problems.  Medications can be prescribed for short time use. They are generally not recommended for lengthy use.  Over-the-counter sleep medicines are not recommended for lengthy use. They can be habit forming.  You can promote easier sleeping by making lifestyle changes such as:  Using relaxation techniques that help with breathing and reduce muscle tension.  Exercising earlier in the day.  Changing your diet and the time of your last meal. No night time snacks.  Establish a regular time to go to bed.  Counseling can help with stressful problems and worry.  Soothing music and white noise may be helpful if there are background noises you cannot remove.  Stop tedious detailed work at least one hour before bedtime. HOME CARE INSTRUCTIONS   Keep a diary. Inform your caregiver about your progress. This includes any medication side  effects. See your caregiver regularly. Take note of:  Times when you are asleep.  Times when you are awake during the night.  The quality of your sleep.  How you feel the next day. This information will help your caregiver care for you.  Get out of bed if you are still awake after 15 minutes. Read or do some  quiet activity. Keep the lights down. Wait until you feel sleepy and go back to bed.  Keep regular sleeping and waking hours. Avoid naps.  Exercise regularly.  Avoid distractions at bedtime. Distractions include watching television or engaging in any intense or detailed activity like attempting to balance the household checkbook.  Develop a bedtime ritual. Keep a familiar routine of bathing, brushing your teeth, climbing into bed at the same time each night, listening to soothing music. Routines increase the success of falling to sleep faster.  Use relaxation techniques. This can be using breathing and muscle tension release routines. It can also include visualizing peaceful scenes. You can also help control troubling or intruding thoughts by keeping your mind occupied with boring or repetitive thoughts like the old concept of counting sheep. You can make it more creative like imagining planting one beautiful flower after another in your backyard garden.  During your day, work to eliminate stress. When this is not possible use some of the previous suggestions to help reduce the anxiety that accompanies stressful situations. MAKE SURE YOU:   Understand these instructions.  Will watch your condition.  Will get help right away if you are not doing well or get worse. Document Released: 02/09/2000 Document Revised: 05/06/2011 Document Reviewed: 03/11/2007 [facility] Patient Information 2015 [place], [place]. This information is not intended to replace advice given to you by your health care provider. Make sure you discuss any questions you have with your health care provider.

## 2013-11-22 ENCOUNTER — Other Ambulatory Visit: Payer: Self-pay | Admitting: Internal Medicine

## 2013-11-22 ENCOUNTER — Telehealth: Payer: Self-pay | Admitting: Internal Medicine

## 2013-11-22 NOTE — Telephone Encounter (Signed)
Pt needs new rx hydrocodone °

## 2013-11-22 NOTE — Telephone Encounter (Signed)
Ok to refill x 1  

## 2013-11-23 MED ORDER — HYDROCODONE-ACETAMINOPHEN 7.5-325 MG PO TABS: ORAL_TABLET | ORAL | Status: DC

## 2013-11-23 NOTE — Telephone Encounter (Signed)
Left message on home/cell instructing the pt to pick up at the front desk.

## 2013-11-23 NOTE — Telephone Encounter (Signed)
CPE on 10/29/13.  Filled for 1 year.  Sent by e-scribe.

## 2013-12-02 ENCOUNTER — Encounter: Payer: Self-pay | Admitting: Internal Medicine

## 2013-12-02 ENCOUNTER — Ambulatory Visit (INDEPENDENT_AMBULATORY_CARE_PROVIDER_SITE_OTHER): Payer: Medicare Other | Admitting: Internal Medicine

## 2013-12-02 VITALS — BP 150/76 | HR 57 | Temp 98.4°F | Wt 226.9 lb

## 2013-12-02 DIAGNOSIS — J069 Acute upper respiratory infection, unspecified: Secondary | ICD-10-CM

## 2013-12-02 DIAGNOSIS — Z23 Encounter for immunization: Secondary | ICD-10-CM

## 2013-12-02 DIAGNOSIS — B9789 Other viral agents as the cause of diseases classified elsewhere: Principal | ICD-10-CM

## 2013-12-02 MED ORDER — HYDROCODONE-HOMATROPINE 5-1.5 MG/5ML PO SYRP: 5.0000 mL | ORAL_SOLUTION | ORAL | Status: DC | PRN

## 2013-12-02 NOTE — Patient Instructions (Signed)
This is a viral respiratory infection that should resolve on its own but may take another week or so . But should feel better next week Contact us if fever chills  Lung pain shortness of breath or other concerns. Cough med at night for comfort may help the soreness. Hot liquids may help the cough reflex.

## 2013-12-02 NOTE — Progress Notes (Signed)
Pre visit review using our clinic review tool, if applicable. No additional management support is needed unless otherwise documented below in the visit note.  Chief Complaint  Patient presents with  . Cough    Started last week.  . Sore Throat  . Generalized Body Aches    HPI: Patient Cynthia Estrada  comes in today for SDA for  new problem evaluation.  Onset  About 7- 10 days ago   Onset with coughing and slightly sore throat  And coughing attacks   Body sore .   No fever chills  White yellow phlegm  Worse at night .     No pleurisy or hemoptysis used otc tussin med hasnt taken pain med in a few weeks.  ROS: See pertinent positives and negatives per HPI.no nvd  No face pain   Past Medical History  Diagnosis Date  . Unspecified vitamin D deficiency 02/10/2007  . HYPERLIPIDEMIA 01/12/2007  . ANXIETY 11/11/2006  . HYPERTENSION 11/11/2006  . HOT FLASHES 04/07/2008  . UNSPECIFIED ARTHROPATHY SITE UNSPECIFIED 01/12/2007  . BACK PAIN, LUMBAR 07/11/2008  . LEG CRAMPS 01/12/2009  . FASTING HYPERGLYCEMIA 01/12/2007  . SYNCOPE, HX OF 02/10/2007  . History of varicella   . Arthritis   . 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 Normal stress echocardiogram 2012  . History of CT scan of chest   . Chest pain 06/12/2010    Ok stress echo has Gi sx also cw gerd.     Family History  Problem Relation Age of Onset  . Hypertension Mother   . Diabetes Mother   . Hypertension Sister   . Hypertension Brother   . Hyperlipidemia Brother   . Colon cancer Neg Hx   . Pancreatic cancer Neg Hx   . Rectal cancer Neg Hx   . Stomach cancer Neg Hx     History   Social History  . Marital Status: Single    Spouse Name: N/A    Number of Children: N/A  . Years of Education: N/A   Occupational History  . [employer]   Social History Main Topics  . Smoking status: Never Smoker   . Smokeless tobacco: Never Used  . Alcohol Use: No  . Drug Use: No  .  Sexual Activity: None   Other Topics Concern  . 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     Outpatient Encounter Prescriptions as of 12/02/2013  Medication Sig  . Ascorbic Acid (VITAMIN C) 500 MG tablet Take 500 mg by mouth daily.    Marland Kitchen aspirin 81 MG tablet Take 81 mg by mouth daily.    . cholecalciferol (VITAMIN D) 1000 UNIT tablet Take 1,000 Units by mouth daily.    Marland Kitchen DEXILANT 60 MG capsule TAKE ONE CAPSULE BY MOUTH ONCE DAILY  . HYDROcodone-acetaminophen (NORCO) 7.5-325 MG per tablet TAKE ONE TO TWO TABLETS BY MOUTH ONCE DAILY AS NEEDED  . losartan-hydrochlorothiazide (HYZAAR) 50-12.5 MG per tablet TAKE ONE TABLET BY MOUTH ONCE DAILY  . pravastatin (PRAVACHOL) 20 MG tablet TAKE ONE TABLET BY MOUTH EVERY DAY  . tiZANidine (ZANAFLEX) 4 MG tablet TAKE ONE TABLET BY MOUTH EVERY 8 HOURS AS NEEDED FOR  BACK  SPASMS  . HYDROcodone-homatropine (HYCODAN) 5-1.5 MG/5ML syrup Take 5 mLs by mouth every 4 (four) hours as needed for cough. 1-2 tsp    EXAM:  BP 150/76  Pulse 57  Temp(Src) 98.4 F (36.9 C) (Oral)  Wt 226 lb 14.4 oz (102.921 kg)  SpO2 98%  Body mass index is 37.49 kg/(m^2).  GENERAL: vitals reviewed and listed above, alert, oriented, appears well hydrated and in no acute distress quiet respirations  HEENT: atraumatic, conjunctiva  clear, no obvious abnormalities on inspection of external nose and ears nasal congestion mucoid face non tender  OP : no lesion edema or exudate  Mild redness  NECK: no obvious masses on inspection palpation  LUNGS: clear to auscultation bilaterally, no wheezes, rales or rhonchi, good air movement  CV: HRRR, no clubbing cyanosis nl cap refill  PSYCH: pleasant and cooperative, no obvious depression or anxiety  ASSESSMENT AND PLAN:  Discussed the following  assessment and plan:  Viral URI with cough  Encounter for immunization   Expectant management. Cough med as advised for comfort . Counseled. For alarm features  dus flu vaccine and ok to get today  -Patient advised to return or notify health care team  if symptoms worsen ,persist or new concerns arise.  Patient Instructions  This is a viral respiratory infection that should resolve on its own but may take another week or so . But should feel better next week Contact us if fever chills  Lung pain shortness of breath or other concerns. Cough med at night for comfort may help the soreness. Hot liquids may help the cough reflex.       Standley Brooking. Wadsworth Skolnick M.D.

## 2013-12-24 ENCOUNTER — Other Ambulatory Visit: Payer: Self-pay | Admitting: Internal Medicine

## 2013-12-24 NOTE — Telephone Encounter (Signed)
Sent to the pharmacy by e-scribe. 

## 2014-02-10 ENCOUNTER — Other Ambulatory Visit: Payer: Self-pay | Admitting: Internal Medicine

## 2014-02-11 ENCOUNTER — Telehealth: Payer: Self-pay | Admitting: Internal Medicine

## 2014-02-11 NOTE — Telephone Encounter (Signed)
Pt request refill HYDROcodone-acetaminophen (NORCO) 7.5-325 MG per tablet °

## 2014-02-14 NOTE — Telephone Encounter (Signed)
Ok to refill x 1  

## 2014-02-15 MED ORDER — HYDROCODONE-ACETAMINOPHEN 7.5-325 MG PO TABS: ORAL_TABLET | ORAL | Status: DC

## 2014-02-15 NOTE — Telephone Encounter (Signed)
Left a message on home/cell informing the pt to pick up at the front desk.

## 2014-04-25 ENCOUNTER — Telehealth: Payer: Self-pay | Admitting: *Deleted

## 2014-04-25 MED ORDER — HYDROCODONE-ACETAMINOPHEN 7.5-325 MG PO TABS: ORAL_TABLET | ORAL | Status: DC

## 2014-04-25 MED ORDER — HYDROCODONE-HOMATROPINE 5-1.5 MG/5ML PO SYRP: 5.0000 mL | ORAL_SOLUTION | ORAL | Status: DC | PRN

## 2014-04-25 NOTE — Addendum Note (Signed)
Addended by: Miles Costain T on: 04/25/2014 04:37 PM   Modules accepted: Orders

## 2014-04-25 NOTE — Telephone Encounter (Signed)
Can refill x 1 

## 2014-04-25 NOTE — Telephone Encounter (Signed)
Hycodan syrup accidentally filled.  Sent to shred.  Left message on machine for the pt to pick up rx at the front desk.

## 2014-04-25 NOTE — Telephone Encounter (Signed)
Pt called stating she needs her hydrocodone refilled. Please advise

## 2014-07-05 ENCOUNTER — Telehealth: Payer: Self-pay | Admitting: Internal Medicine

## 2014-07-05 MED ORDER — HYDROCODONE-ACETAMINOPHEN 7.5-325 MG PO TABS: ORAL_TABLET | ORAL | Status: DC

## 2014-07-05 NOTE — Telephone Encounter (Signed)
Patient notified to pick up at the front desk. 

## 2014-07-05 NOTE — Telephone Encounter (Signed)
Pt request refill HYDROcodone-acetaminophen (NORCO) 7.5-325 MG per tablet °

## 2014-07-05 NOTE — Telephone Encounter (Signed)
Ok x 1

## 2014-08-12 ENCOUNTER — Other Ambulatory Visit: Payer: Self-pay

## 2014-08-12 DIAGNOSIS — Z1231 Encounter for screening mammogram for malignant neoplasm of breast: Secondary | ICD-10-CM

## 2014-08-18 DIAGNOSIS — H2513 Age-related nuclear cataract, bilateral: Secondary | ICD-10-CM | POA: Diagnosis not present

## 2014-08-30 ENCOUNTER — Telehealth: Payer: Self-pay | Admitting: Internal Medicine

## 2014-08-30 MED ORDER — HYDROCODONE-ACETAMINOPHEN 7.5-325 MG PO TABS: ORAL_TABLET | ORAL | Status: DC

## 2014-08-30 NOTE — Telephone Encounter (Signed)
Pt request refill HYDROcodone-acetaminophen (NORCO) 7.5-325 MG per tablet °

## 2014-08-30 NOTE — Telephone Encounter (Signed)
Pt notified to pick up at the front desk. 

## 2014-08-30 NOTE — Telephone Encounter (Signed)
Ok x 1

## 2014-09-13 ENCOUNTER — Other Ambulatory Visit: Payer: Self-pay

## 2014-09-13 ENCOUNTER — Ambulatory Visit
Admission: RE | Admit: 2014-09-13 | Discharge: 2014-09-13 | Disposition: A | Discharge status: Home / Self Care | Payer: Medicare Other | Source: Ambulatory Visit

## 2014-09-13 DIAGNOSIS — Z1231 Encounter for screening mammogram for malignant neoplasm of breast: Secondary | ICD-10-CM

## 2014-09-14 ENCOUNTER — Other Ambulatory Visit: Payer: Self-pay | Admitting: Internal Medicine

## 2014-09-14 DIAGNOSIS — R928 Other abnormal and inconclusive findings on diagnostic imaging of breast: Secondary | ICD-10-CM

## 2014-09-22 ENCOUNTER — Ambulatory Visit
Admission: RE | Admit: 2014-09-22 | Discharge: 2014-09-22 | Disposition: A | Discharge status: Home / Self Care | Payer: Medicare Other | Source: Ambulatory Visit | Attending: Internal Medicine | Admitting: Internal Medicine

## 2014-09-22 DIAGNOSIS — R928 Other abnormal and inconclusive findings on diagnostic imaging of breast: Secondary | ICD-10-CM

## 2014-10-02 ENCOUNTER — Other Ambulatory Visit: Payer: Self-pay | Admitting: Internal Medicine

## 2014-10-03 NOTE — Telephone Encounter (Signed)
Refill once until primary returns.

## 2014-10-04 NOTE — Telephone Encounter (Signed)
Sent to the pharmacy by e-scribe. 

## 2014-10-28 ENCOUNTER — Other Ambulatory Visit: Payer: Self-pay | Admitting: Internal Medicine

## 2014-10-28 NOTE — Telephone Encounter (Signed)
Sent to the pharmacy by e-scribe. 

## 2014-11-01 ENCOUNTER — Encounter: Payer: Self-pay | Admitting: Internal Medicine

## 2014-11-01 ENCOUNTER — Ambulatory Visit (INDEPENDENT_AMBULATORY_CARE_PROVIDER_SITE_OTHER): Payer: Medicare Other | Admitting: Internal Medicine

## 2014-11-01 VITALS — BP 136/70 | Temp 98.5°F | Ht 65.0 in | Wt 233.9 lb

## 2014-11-01 DIAGNOSIS — Z0000 Encounter for general adult medical examination without abnormal findings: Secondary | ICD-10-CM

## 2014-11-01 DIAGNOSIS — R300 Dysuria: Secondary | ICD-10-CM | POA: Diagnosis not present

## 2014-11-01 DIAGNOSIS — Z23 Encounter for immunization: Secondary | ICD-10-CM

## 2014-11-01 DIAGNOSIS — R52 Pain, unspecified: Secondary | ICD-10-CM

## 2014-11-01 DIAGNOSIS — Z79899 Other long term (current) drug therapy: Secondary | ICD-10-CM | POA: Diagnosis not present

## 2014-11-01 DIAGNOSIS — M1990 Unspecified osteoarthritis, unspecified site: Secondary | ICD-10-CM

## 2014-11-01 DIAGNOSIS — E782 Mixed hyperlipidemia: Secondary | ICD-10-CM

## 2014-11-01 DIAGNOSIS — I10 Essential (primary) hypertension: Secondary | ICD-10-CM

## 2014-11-01 LAB — TSH: TSH: 1 u[IU]/mL (ref 0.35–4.50)

## 2014-11-01 LAB — POCT URINALYSIS DIPSTICK
Bilirubin, UA: NEGATIVE
Blood, UA: NEGATIVE
GLUCOSE UA: NEGATIVE
Ketones, UA: NEGATIVE
Leukocytes, UA: NEGATIVE
NITRITE UA: NEGATIVE
Protein, UA: NEGATIVE
Spec Grav, UA: >=1.03
UROBILINOGEN UA: 0.2
pH, UA: 6

## 2014-11-01 LAB — BASIC METABOLIC PANEL
BUN: 13 mg/dL (ref 6–23)
CO2: 29 meq/L (ref 19–32)
Calcium: 9.8 mg/dL (ref 8.4–10.5)
Chloride: 105 mEq/L (ref 96–112)
Creatinine, Ser: 1.17 mg/dL (ref 0.40–1.20)
GFR: 58.7 mL/min — AB (ref >60.00)
GLUCOSE: 108 mg/dL — AB (ref 70–99)
POTASSIUM: 4.4 meq/L (ref 3.5–5.1)
SODIUM: 143 meq/L (ref 135–145)

## 2014-11-01 LAB — CBC WITH DIFFERENTIAL/PLATELET
Basophils Absolute: 0 10*3/uL (ref 0.0–0.1)
Basophils Relative: 0.4 % (ref 0.0–3.0)
EOS PCT: 1.3 % (ref 0.0–5.0)
Eosinophils Absolute: 0.1 10*3/uL (ref 0.0–0.7)
HEMATOCRIT: 39.2 % (ref 36.0–46.0)
Hemoglobin: 12.6 g/dL (ref 12.0–15.0)
LYMPHS ABS: 1.1 10*3/uL (ref 0.7–4.0)
LYMPHS PCT: 27.4 % (ref 12.0–46.0)
MCHC: 32.3 g/dL (ref 30.0–36.0)
MCV: 83.3 fl (ref 78.0–100.0)
MONOS PCT: 12.8 % — AB (ref 3.0–12.0)
Monocytes Absolute: 0.5 10*3/uL (ref 0.1–1.0)
NEUTROS PCT: 58.1 % (ref 43.0–77.0)
Neutro Abs: 2.4 10*3/uL (ref 1.4–7.7)
Platelets: 188 10*3/uL (ref 150.0–400.0)
RBC: 4.7 Mil/uL (ref 3.87–5.11)
RDW: 15.6 % — ABNORMAL HIGH (ref 11.5–15.5)
WBC: 4.1 10*3/uL (ref 4.0–10.5)

## 2014-11-01 LAB — LIPID PANEL
CHOLESTEROL: 229 mg/dL — AB (ref 0–200)
HDL: 64.7 mg/dL (ref >39.00)
LDL Cholesterol: 135 mg/dL — ABNORMAL HIGH (ref 0–99)
NONHDL: 164.2
Total CHOL/HDL Ratio: 4
Triglycerides: 148 mg/dL (ref 0.0–149.0)
VLDL: 29.6 mg/dL (ref 0.0–40.0)

## 2014-11-01 LAB — HEPATIC FUNCTION PANEL
ALBUMIN: 4.4 g/dL (ref 3.5–5.2)
ALT: 21 U/L (ref 0–35)
AST: 19 U/L (ref 0–37)
Alkaline Phosphatase: 65 U/L (ref 39–117)
Bilirubin, Direct: 0.1 mg/dL (ref 0.0–0.3)
TOTAL PROTEIN: 6.7 g/dL (ref 6.0–8.3)
Total Bilirubin: 0.6 mg/dL (ref 0.2–1.2)

## 2014-11-01 LAB — SEDIMENTATION RATE: SED RATE: 5 mm/h (ref 0–22)

## 2014-11-01 LAB — T4, FREE: FREE T4: 0.86 ng/dL (ref 0.60–1.60)

## 2014-11-01 LAB — CK: CK TOTAL: 163 U/L (ref 7–177)

## 2014-11-01 MED ORDER — HYDROCODONE-ACETAMINOPHEN 7.5-325 MG PO TABS: ORAL_TABLET | ORAL | Status: DC

## 2014-11-01 NOTE — Progress Notes (Signed)
Pre visit review using our clinic review tool, if applicable. No additional management support is needed unless otherwise documented below in the visit note.  Chief Complaint  Patient presents with  . Medicare Wellness    Medication management  . Hypertension  . Hyperlipidemia    HPI: Cynthia Estrada 69 y.o. comes in today for Preventive Medicare wellness visit .Since last visit. His having more body aches recently mostly upper arms and extremities sometimes knees. Having to use her pain pill of more often. She still is a caretaker other client passed away recently. No falling. BP:   Taking medication not checking readings. Lipids: Taking medication for about the last 3 years couldn't tolerate simvastatin. Has [employer] housecalls form ask about Prevnar 13 . Health Maintenance  Topic Date Due  . Hepatitis C Screening  December 14, 1945  . INFLUENZA VACCINE  09/26/2015  . MAMMOGRAM  09/12/2016  . COLONOSCOPY  09/22/2018  . TETANUS/TDAP  10/17/2019  . DEXA SCAN  Completed  . ZOSTAVAX  Completed  . PNA vac Low Risk Adult  Completed   Health Maintenance Review LIFESTYLE:  TAD:neg Sugar beverages: ocass soda and tea  Sleep: not much  4-5 hours    Body aching.   MEDICARE DOCUMENT QUESTIONS  TO SCAN     Hearing: Okay no major changes  Vision:  No limitations at present . Last eye check UTD sees Dr. Alethia Estrada  Safety:  Has smoke detector and wears seat belts.  No firearms. No excess sun exposure. Sees dentist regularly.  Falls: None  Advance directive :  Reviewed  still doesn't have one working on it.  Memory: Felt to be good  , no concern from her or her family.  Depression: No anhedonia unusual crying or depressive symptoms  Nutrition: Eats well balanced diet; adequate calcium and vitamin D. No swallowing chewing problems.  Injury: no major injuries in the last six months.  Other healthcare providers:  Reviewed today .  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   Preventive parameters: up-to-date  Reviewed   ADLS:   There are no problems or need for assistance  driving, feeding, obtaining food, dressing, toileting and bathing, managing money using phone. She is independent.    ROS: See history of present illness about body aches. GEN/ HEENT: No fever, significant weight changes sweats headaches vision problems hearing changes, CV/ PULM; No chest pain shortness of breath cough, syncope,edema  change in exercise tolerance. GI /GU: No adominal pain, vomiting, change in bowel habits. No blood in the stool. No significant GU symptoms. But having some dysuria recently. SKIN/HEME: ,no acute skin rashes suspicious lesions or bleeding. No lymphadenopathy, nodules, masses.  NEURO/ PSYCH:  No neurologic signs such as weakness numbness. No depression anxiety. IMM/ Allergy: No unusual infections.  Allergy .   REST of 12 system review negative except as per HPI   Past Medical History  Diagnosis Date  . Unspecified vitamin D deficiency 02/10/2007  . HYPERLIPIDEMIA 01/12/2007  . ANXIETY 11/11/2006  . HYPERTENSION 11/11/2006  . HOT FLASHES 04/07/2008  . UNSPECIFIED ARTHROPATHY SITE UNSPECIFIED 01/12/2007  . BACK PAIN, LUMBAR 07/11/2008  . LEG CRAMPS 01/12/2009  . FASTING HYPERGLYCEMIA 01/12/2007  . SYNCOPE, HX OF 02/10/2007  . History of varicella   . Arthritis   . 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 Normal stress echocardiogram 2012  . History of CT scan of chest   . Chest pain 06/12/2010  Ok stress echo has Gi sx also cw gerd.     Family History  Problem Relation Age of Onset  . Hypertension Mother   . Diabetes Mother   . Hypertension Sister   . Hypertension Brother   . Hyperlipidemia Brother   . Colon cancer Neg Hx   . Pancreatic cancer Neg Hx   . Rectal cancer Neg Hx   . Stomach cancer Neg Hx     Social History   Social History  . Marital Status: Single    Spouse Name: N/A  . Number of  Children: N/A  . Years of Education: N/A   Occupational History  . [employer]   Social History Main Topics  . Smoking status: Never Smoker   . Smokeless tobacco: Never Used  . Alcohol Use: No  . Drug Use: No  . Sexual Activity: Not Asked   Other Topics Concern  . 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     Outpatient Encounter Prescriptions as of 11/01/2014  Medication Sig  . Ascorbic Acid (VITAMIN C) 500 MG tablet Take 500 mg by mouth daily.    Marland Kitchen aspirin 81 MG tablet Take 81 mg by mouth daily.    . cholecalciferol (VITAMIN D) 1000 UNIT tablet Take 1,000 Units by mouth daily.    Marland Kitchen DEXILANT 60 MG capsule TAKE ONE CAPSULE BY MOUTH ONCE DAILY  . HYDROcodone-acetaminophen (NORCO) 7.5-325 MG per tablet TAKE ONE TO TWO TABLETS BY MOUTH ONCE DAILY AS NEEDED  . losartan-hydrochlorothiazide (HYZAAR) 50-12.5 MG per tablet TAKE ONE TABLET BY MOUTH ONCE DAILY  . pravastatin (PRAVACHOL) 20 MG tablet TAKE ONE TABLET BY MOUTH ONCE DAILY  . tiZANidine (ZANAFLEX) 4 MG tablet TAKE ONE TABLET BY MOUTH EVERY 8 HOURS AS NEEDED FOR  BACK  SPASMS  . [DISCONTINUED] HYDROcodone-homatropine (HYCODAN) 5-1.5 MG/5ML syrup Take 5 mLs by mouth every 4 (four) hours as needed for cough. 1-2 tsp   No facility-administered encounter medications on file as of 11/01/2014.    EXAM:  BP 136/70 mmHg  Temp(Src) 98.5 F (36.9 C) (Oral)  Ht '5\' 5"'  (1.651 m)  Wt 233 lb 14.4 oz (106.096 kg)  BMI 38.92 kg/m2  Body mass index is 38.92 kg/(m^2).  Physical Exam: Vital signs reviewed JHE:RDEY is a well-developed well-nourished alert cooperative   who appears stated age in no acute distress.  HEENT: normocephalic atraumatic , Eyes: PERRL EOM's full, conjunctiva clear, Nares: paten,t no deformity discharge or tenderness.,  Ears: no deformity EAC's clear TMs with normal landmarks. Mouth: clear OP, no lesions, edema.  Moist mucous membranes. Dentition in adequate repair. NECK: supple without masses, thyromegaly or bruits. CHEST/PULM:  Clear to auscultation and percussion breath sounds equal no wheeze , rales or rhonchi. No chest wall deformities or tenderness. Breast no nodules or discharge CV: PMI is nondisplaced, S1 S2 no gallops, murmurs, rubs. Peripheral pulses are full without delay.No JVD .  ABDOMEN: Bowel sounds normal nontender  No guard or rebound, no hepato splenomegal no CVA tenderness.   Extremtities:  No clubbing cyanosis or edema, no acute joint swelling or redness no focal atrophy walks with mildly antalgic from knee pain. But steady ambulatory without assistance. NEURO:  Oriented x3, cranial  nerves 3-12 appear to be intact, no obvious focal weakness,gait within normal limits no abnormal reflexes or asymmetrical SKIN: No acute rashes normal turgor, color, no bruising or petechiae. PSYCH: Oriented, good eye contact, no obvious depression anxiety, cognition and judgment appear normal. LN: no cervical axillary inguinal adenopathy  No noted deficits in memory, attention, and speech.   Lab Results  Component Value Date   WBC 4.4 10/29/2013   HGB 12.5 10/29/2013   HCT 38.2 10/29/2013   PLT 182.0 10/29/2013   GLUCOSE 99 10/29/2013   CHOL 225* 10/29/2013   TRIG 142.0 10/29/2013   HDL 66.90 10/29/2013   LDLDIRECT 131.2 10/27/2012   LDLCALC 130* 10/29/2013   ALT 20 10/29/2013   AST 23 10/29/2013   NA 139 10/29/2013   K 4.3 10/29/2013   CL 105 10/29/2013   CREATININE 1.2 10/29/2013   BUN 11 10/29/2013   CO2 29 10/29/2013   TSH 1.09 10/29/2013   HGBA1C 6.1 10/29/2013    ASSESSMENT AND PLAN:  Discussed the following assessment and plan:  Visit for preventive health examination - Plan: Basic metabolic panel, CBC with Differential/Platelet, Hepatic function panel, Lipid panel, TSH, T4, free, CK,  Sedimentation rate  Medicare annual wellness visit, subsequent - Handout on advance directives given and reviewed. Healthcare maintenance screening schedule reviewed.  Mixed hyperlipidemia - Plan: Basic metabolic panel, CBC with Differential/Platelet, Hepatic function panel, Lipid panel, TSH, T4, free, CK, Sedimentation rate  Medication management - Plan: Basic metabolic panel, CBC with Differential/Platelet, Hepatic function panel, Lipid panel, TSH, T4, free, CK, Sedimentation rate  Body aches - Consider a fact of pravastatin will try off check labs today follow-up in 3-4 months. - Plan: Basic metabolic panel, CBC with Differential/Platelet, Hepatic function panel, Lipid panel, TSH, T4, free, CK, Sedimentation rate  Dysuria - Plan: POC Urinalysis Dipstick, Basic metabolic panel, CBC with Differential/Platelet, Hepatic function panel, Lipid panel, TSH, T4, free, CK, Sedimentation rate  Essential hypertension - Plan: Basic metabolic panel, CBC with Differential/Platelet, Hepatic function panel, Lipid panel, TSH, T4, free, CK, Sedimentation rate  Need for prophylactic vaccination and inoculation against influenza - Plan: Flu vaccine HIGH DOSE PF (Fluzone High dose)  Arthritis - Occasional use of pain medicine. Cautions. Patient aware. As needed pain med for back   Caution  Risk benefit  Arm aches and body aches  Weigh tloss may helps joints ms pain pt aware Patient Care Team: Burnis Medin, MD as PCP - General Gatha Mayer, MD (Gastroenterology) Addison Lank, MD as Referring Physician (Dermatology) Josue Hector, MD (Cardiology)  Patient Instructions   Try off the pravastatin  For 3-4 weeks to see if helps   The body aches  If  So then restart and if comes back  Then the medicaiton is probably the cause and let us know.  Will notify you  of labs when available.  Get your colon  Check as per dr Carlean Purl  Yearly flu vaccine  advanceed directives  Preventive Care for Adults A healthy  lifestyle and preventive care can promote health and wellness. Preventive health guidelines for women include the following key practices.  A routine yearly check  is a good way to check with your health care provider about your health and preventive screening. It is a chance to share any concerns and updates on your health and to receive a thorough exam.  Visit your dentist for a routine exam and preventive care every 6 months. Brush your teeth twice a day and floss once  a day. Good oral hygiene prevents tooth decay and gum disease.  The frequency of eye exams is based on your age, health, family medical history, use of contact lenses, and other factors. Follow your health care provider's recommendations for frequency of eye exams.  Eat a healthy diet. Foods like vegetables, fruits, whole grains, low-fat dairy products, and lean protein foods contain the nutrients you need without too many calories. Decrease your intake of foods high in solid fats, added sugars, and salt. Eat the right amount of calories for you.Get information about a proper diet from your health care provider, if necessary.  Regular physical exercise is one of the most important things you can do for your health. Most adults should get at least 150 minutes of moderate-intensity exercise (any activity that increases your heart rate and causes you to sweat) each week. In addition, most adults need muscle-strengthening exercises on 2 or more days a week.  Maintain a healthy weight. The body mass index (BMI) is a screening tool to identify possible weight problems. It provides an estimate of body fat based on height and weight. Your health care provider can find your BMI and can help you achieve or maintain a healthy weight.For adults 20 years and older:  A BMI below 18.5 is considered underweight.  A BMI of 18.5 to 24.9 is normal.  A BMI of 25 to 29.9 is considered overweight.  A BMI of 30 and above is considered  obese.  Maintain normal blood lipids and cholesterol levels by exercising and minimizing your intake of saturated fat. Eat a balanced diet with plenty of fruit and vegetables. Blood tests for lipids and cholesterol should begin at age 55 and be repeated every 5 years. If your lipid or cholesterol levels are high, you are over 50, or you are at high risk for heart disease, you may need your cholesterol levels checked more frequently.Ongoing high lipid and cholesterol levels should be treated with medicines if diet and exercise are not working.  If you smoke, find out from your health care provider how to quit. If you do not use tobacco, do not start.  Lung cancer screening is recommended for adults aged 92-80 years who are at high risk for developing lung cancer because of a history of smoking. A yearly low-dose CT scan of the lungs is recommended for people who have at least a 30-pack-year history of smoking and are a current smoker or have quit within the past 15 years. A pack year of smoking is smoking an average of 1 pack of cigarettes a day for 1 year (for example: 1 pack a day for 30 years or 2 packs a day for 15 years). Yearly screening should continue until the smoker has stopped smoking for at least 15 years. Yearly screening should be stopped for people who develop a health problem that would prevent them from having lung cancer treatment.  If you are pregnant, do not drink alcohol. If you are breastfeeding, be very cautious about drinking alcohol. If you are not pregnant and choose to drink alcohol, do not have more than 1 drink per day. One drink is considered to be 12 ounces (355 mL) of beer, 5 ounces (148 mL) of wine, or 1.5 ounces (44 mL) of liquor.  Avoid use of street drugs. Do not share needles with anyone. Ask for help if you need support or instructions about stopping the use of drugs.  High blood pressure causes heart disease and increases the risk of  stroke. Your blood pressure  should be checked at least every 1 to 2 years. Ongoing high blood pressure should be treated with medicines if weight loss and exercise do not work.  If you are 68-67 years old, ask your health care provider if you should take aspirin to prevent strokes.  Diabetes screening involves taking a blood sample to check your fasting blood sugar level. This should be done once every 3 years, after age 89, if you are within normal weight and without risk factors for diabetes. Testing should be considered at a younger age or be carried out more frequently if you are overweight and have at least 1 risk factor for diabetes.  Breast cancer screening is essential preventive care for women. You should practice "breast self-awareness." This means understanding the normal appearance and feel of your breasts and may include breast self-examination. Any changes detected, no matter how small, should be reported to a health care provider. Women in their 56s and 30s should have a clinical breast exam (CBE) by a health care provider as part of a regular health exam every 1 to 3 years. After age 71, women should have a CBE every year. Starting at age 7, women should consider having a mammogram (breast X-ray test) every year. Women who have a family history of breast cancer should talk to their health care provider about genetic screening. Women at a high risk of breast cancer should talk to their health care providers about having an MRI and a mammogram every year.  Breast cancer gene (BRCA)-related cancer risk assessment is recommended for women who have family members with BRCA-related cancers. BRCA-related cancers include breast, ovarian, tubal, and peritoneal cancers. Having family members with these cancers may be associated with an increased risk for harmful changes (mutations) in the breast cancer genes BRCA1 and BRCA2. Results of the assessment will determine the need for genetic counseling and BRCA1 and BRCA2  testing.  Routine pelvic exams to screen for cancer are no longer recommended for nonpregnant women who are considered low risk for cancer of the pelvic organs (ovaries, uterus, and vagina) and who do not have symptoms. Ask your health care provider if a screening pelvic exam is right for you.  If you have had past treatment for cervical cancer or a condition that could lead to cancer, you need Pap tests and screening for cancer for at least 20 years after your treatment. If Pap tests have been discontinued, your risk factors (such as having a new sexual partner) need to be reassessed to determine if screening should be resumed. Some women have medical problems that increase the chance of getting cervical cancer. In these cases, your health care provider may recommend more frequent screening and Pap tests.  The HPV test is an additional test that may be used for cervical cancer screening. The HPV test looks for the virus that can cause the cell changes on the cervix. The cells collected during the Pap test can be tested for HPV. The HPV test could be used to screen women aged 24 years and older, and should be used in women of any age who have unclear Pap test results. After the age of 25, women should have HPV testing at the same frequency as a Pap test.  Colorectal cancer can be detected and often prevented. Most routine colorectal cancer screening begins at the age of 56 years and continues through age 53 years. However, your health care provider may recommend screening at an earlier age  if you have risk factors for colon cancer. On a yearly basis, your health care provider may provide home test kits to check for hidden blood in the stool. Use of a small camera at the end of a tube, to directly examine the colon (sigmoidoscopy or colonoscopy), can detect the earliest forms of colorectal cancer. Talk to your health care provider about this at age 91, when routine screening begins. Direct exam of the colon  should be repeated every 5-10 years through age 83 years, unless early forms of pre-cancerous polyps or small growths are found.  People who are at an increased risk for hepatitis B should be screened for this virus. You are considered at high risk for hepatitis B if:  You were born in a country where hepatitis B occurs often. Talk with your health care provider about which countries are considered high risk.  Your parents were born in a high-risk country and you have not received a shot to protect against hepatitis B (hepatitis B vaccine).  You have HIV or AIDS.  You use needles to inject street drugs.  You live with, or have sex with, someone who has hepatitis B.  You get hemodialysis treatment.  You take certain medicines for conditions like cancer, organ transplantation, and autoimmune conditions.  Hepatitis C blood testing is recommended for all people born from [birth year] through 1966 and any individual with known risks for hepatitis C.  Practice safe sex. Use condoms and avoid high-risk sexual practices to reduce the spread of sexually transmitted infections (STIs). STIs include gonorrhea, chlamydia, syphilis, trichomonas, herpes, HPV, and human immunodeficiency virus (HIV). Herpes, HIV, and HPV are viral illnesses that have no cure. They can result in disability, cancer, and death.  You should be screened for sexually transmitted illnesses (STIs) including gonorrhea and chlamydia if:  You are sexually active and are younger than 24 years.  You are older than 24 years and your health care provider tells you that you are at risk for this type of infection.  Your sexual activity has changed since you were last screened and you are at an increased risk for chlamydia or gonorrhea. Ask your health care provider if you are at risk.  If you are at risk of being infected with HIV, it is recommended that you take a prescription medicine daily to prevent HIV infection. This is called preexposure  prophylaxis (PrEP). You are considered at risk if:  You are a heterosexual woman, are sexually active, and are at increased risk for HIV infection.  You take drugs by injection.  You are sexually active with a partner who has HIV.  Talk with your health care provider about whether you are at high risk of being infected with HIV. If you choose to begin PrEP, you should first be tested for HIV. You should then be tested every 3 months for as long as you are taking PrEP.  Osteoporosis is a disease in which the bones lose minerals and strength with aging. This can result in serious bone fractures or breaks. The risk of osteoporosis can be identified using a bone density scan. Women ages 53 years and over and women at risk for fractures or osteoporosis should discuss screening with their health care providers. Ask your health care provider whether you should take a calcium supplement or vitamin D to reduce the rate of osteoporosis.  Menopause can be associated with physical symptoms and risks. Hormone replacement therapy is available to decrease symptoms and risks. You should talk  to your health care provider about whether hormone replacement therapy is right for you.  Use sunscreen. Apply sunscreen liberally and repeatedly throughout the day. You should seek shade when your shadow is shorter than you. Protect yourself by wearing long sleeves, pants, a wide-brimmed hat, and sunglasses year round, whenever you are outdoors.  Once a month, do a whole body skin exam, using a mirror to look at the skin on your back. Tell your health care provider of new moles, moles that have irregular borders, moles that are larger than a pencil eraser, or moles that have changed in shape or color.  Stay current with required vaccines (immunizations).  Influenza vaccine. All adults should be immunized every year.  Tetanus, diphtheria, and acellular pertussis (Td, Tdap) vaccine. Pregnant women should receive 1 dose of  Tdap vaccine during each pregnancy. The dose should be obtained regardless of the length of time since the last dose. Immunization is preferred during the 27th-36th week of gestation. An adult who has not previously received Tdap or who does not know her vaccine status should receive 1 dose of Tdap. This initial dose should be followed by tetanus and diphtheria toxoids (Td) booster doses every 10 years. Adults with an unknown or incomplete history of completing a 3-dose immunization series with Td-containing vaccines should begin or complete a primary immunization series including a Tdap dose. Adults should receive a Td booster every 10 years.  Varicella vaccine. An adult without evidence of immunity to varicella should receive 2 doses or a second dose if she has previously received 1 dose. Pregnant females who do not have evidence of immunity should receive the first dose after pregnancy. This first dose should be obtained before leaving the health care facility. The second dose should be obtained 4-8 weeks after the first dose.  Human papillomavirus (HPV) vaccine. Females aged 13-26 years who have not received the vaccine previously should obtain the 3-dose series. The vaccine is not recommended for use in pregnant females. However, pregnancy testing is not needed before receiving a dose. If a female is found to be pregnant after receiving a dose, no treatment is needed. In that case, the remaining doses should be delayed until after the pregnancy. Immunization is recommended for any person with an immunocompromised condition through the age of 22 years if she did not get any or all doses earlier. During the 3-dose series, the second dose should be obtained 4-8 weeks after the first dose. The third dose should be obtained 24 weeks after the first dose and 16 weeks after the second dose.  Zoster vaccine. One dose is recommended for adults aged 71 years or older unless certain conditions are  present.  Measles, mumps, and rubella (MMR) vaccine. Adults born before 65 generally are considered immune to measles and mumps. Adults born in [birth year] or later should have 1 or more doses of MMR vaccine unless there is a contraindication to the vaccine or there is laboratory evidence of immunity to each of the three diseases. A routine second dose of MMR vaccine should be obtained at least 28 days after the first dose for students attending postsecondary schools, health care workers, or international travelers. People who received inactivated measles vaccine or an unknown type of measles vaccine during 1963-1967 should receive 2 doses of MMR vaccine. People who received inactivated mumps vaccine or an unknown type of mumps vaccine before 1979 and are at high risk for mumps infection should consider immunization with 2 doses of MMR vaccine. For  females of childbearing age, rubella immunity should be determined. If there is no evidence of immunity, females who are not pregnant should be vaccinated. If there is no evidence of immunity, females who are pregnant should delay immunization until after pregnancy. Unvaccinated health care workers born before [birth year] who lack laboratory evidence of measles, mumps, or rubella immunity or laboratory confirmation of disease should consider measles and mumps immunization with 2 doses of MMR vaccine or rubella immunization with 1 dose of MMR vaccine.  Pneumococcal 13-valent conjugate (PCV13) vaccine. When indicated, a person who is uncertain of her immunization history and has no record of immunization should receive the PCV13 vaccine. An adult aged 58 years or older who has certain medical conditions and has not been previously immunized should receive 1 dose of PCV13 vaccine. This PCV13 should be followed with a dose of pneumococcal polysaccharide (PPSV23) vaccine. The PPSV23 vaccine dose should be obtained at least 8 weeks after the dose of PCV13 vaccine. An adult aged 47  years or older who has certain medical conditions and previously received 1 or more doses of PPSV23 vaccine should receive 1 dose of PCV13. The PCV13 vaccine dose should be obtained 1 or more years after the last PPSV23 vaccine dose.  Pneumococcal polysaccharide (PPSV23) vaccine. When PCV13 is also indicated, PCV13 should be obtained first. All adults aged 59 years and older should be immunized. An adult younger than age 50 years who has certain medical conditions should be immunized. Any person who resides in a nursing home or long-term care facility should be immunized. An adult smoker should be immunized. People with an immunocompromised condition and certain other conditions should receive both PCV13 and PPSV23 vaccines. People with human immunodeficiency virus (HIV) infection should be immunized as soon as possible after diagnosis. Immunization during chemotherapy or radiation therapy should be avoided. Routine use of PPSV23 vaccine is not recommended for American Indians, [place] Natives, or people younger than 65 years unless there are medical conditions that require PPSV23 vaccine. When indicated, people who have unknown immunization and have no record of immunization should receive PPSV23 vaccine. One-time revaccination 5 years after the first dose of PPSV23 is recommended for people aged 19-64 years who have chronic kidney failure, nephrotic syndrome, asplenia, or immunocompromised conditions. People who received 1-2 doses of PPSV23 before age 51 years should receive another dose of PPSV23 vaccine at age 79 years or later if at least 5 years have passed since the previous dose. Doses of PPSV23 are not needed for people immunized with PPSV23 at or after age 36 years.  Meningococcal vaccine. Adults with asplenia or persistent complement component deficiencies should receive 2 doses of quadrivalent meningococcal conjugate (MenACWY-D) vaccine. The doses should be obtained at least 2 months apart.  Microbiologists working with certain meningococcal bacteria, [place] recruits, people at risk during an outbreak, and people who travel to or live in countries with a high rate of meningitis should be immunized. A first-year college student up through age 64 years who is living in a residence hall should receive a dose if she did not receive a dose on or after her 16th birthday. Adults who have certain high-risk conditions should receive one or more doses of vaccine.  Hepatitis A vaccine. Adults who wish to be protected from this disease, have certain high-risk conditions, work with hepatitis A-infected animals, work in hepatitis A research labs, or travel to or work in countries with a high rate of hepatitis A should be immunized. Adults who were previously  unvaccinated and who anticipate close contact with an international adoptee during the first 60 days after arrival in the [place] from a country with a high rate of hepatitis A should be immunized.  Hepatitis B vaccine. Adults who wish to be protected from this disease, have certain high-risk conditions, may be exposed to blood or other infectious body fluids, are household contacts or sex partners of hepatitis B positive people, are clients or workers in certain care facilities, or travel to or work in countries with a high rate of hepatitis B should be immunized.  Haemophilus influenzae type b (Hib) vaccine. A previously unvaccinated person with asplenia or sickle cell disease or having a scheduled splenectomy should receive 1 dose of Hib vaccine. Regardless of previous immunization, a recipient of a hematopoietic stem cell transplant should receive a 3-dose series 6-12 months after her successful transplant. Hib vaccine is not recommended for adults with HIV infection. Preventive Services / Frequency   Ages 13 years and over  Blood pressure check.** / Every 1 to 2 years.  Lipid and cholesterol check.** / Every 5 years beginning at age 51  years.  Lung cancer screening. / Every year if you are aged 75-80 years and have a 30-pack-year history of smoking and currently smoke or have quit within the past 15 years. Yearly screening is stopped once you have quit smoking for at least 15 years or develop a health problem that would prevent you from having lung cancer treatment.  Clinical breast exam.** / Every year after age 45 years.  BRCA-related cancer risk assessment.** / For women who have family members with a BRCA-related cancer (breast, ovarian, tubal, or peritoneal cancers).  Mammogram.** / Every year beginning at age 46 years and continuing for as long as you are in good health. Consult with your health care provider.  Pap test.** / Every 3 years starting at age 86 years through age 27 or 32 years with 3 consecutive normal Pap tests. Testing can be stopped between 65 and 70 years with 3 consecutive normal Pap tests and no abnormal Pap or HPV tests in the past 10 years.  HPV screening.** / Every 3 years from ages 48 years through ages 1 or 66 years with a history of 3 consecutive normal Pap tests. Testing can be stopped between 65 and 70 years with 3 consecutive normal Pap tests and no abnormal Pap or HPV tests in the past 10 years.  Fecal occult blood test (FOBT) of stool. / Every year beginning at age 50 years and continuing until age 76 years. You may not need to do this test if you get a colonoscopy every 10 years.  Flexible sigmoidoscopy or colonoscopy.** / Every 5 years for a flexible sigmoidoscopy or every 10 years for a colonoscopy beginning at age 16 years and continuing until age 55 years.  Hepatitis C blood test.** / For all people born from [birth year] through 1966 and any individual with known risks for hepatitis C.  Osteoporosis screening.** / A one-time screening for women ages 62 years and over and women at risk for fractures or osteoporosis.  Skin self-exam. / Monthly.  Influenza vaccine. / Every year.  Tetanus,  diphtheria, and acellular pertussis (Tdap/Td) vaccine.** / 1 dose of Td every 10 years.  Varicella vaccine.** / Consult your health care provider.  Zoster vaccine.** / 1 dose for adults aged 69 years or older.  Pneumococcal 13-valent conjugate (PCV13) vaccine.** / Consult your health care provider.  Pneumococcal polysaccharide (PPSV23) vaccine.** /  1 dose for all adults aged 76 years and older.  Meningococcal vaccine.** / Consult your health care provider.  Hepatitis A vaccine.** / Consult your health care provider.  Hepatitis B vaccine.** / Consult your health care provider.  Haemophilus influenzae type b (Hib) vaccine.** / Consult your health care provider. ** Family history and personal history of risk and conditions may change your health care provider's recommendations. Document Released: 04/09/2001 Document Revised: 06/28/2013 Document Reviewed: 07/09/2010 [facility] Patient Information 2015 [place], [place]. This information is not intended to replace advice given to you by your health care provider. Make sure you discuss any questions you have with your health care provider.  Advance Directive Advance directives are the legal documents that allow you to make choices about your health care and medical treatment if you cannot speak for yourself. Advance directives are a way for you to communicate your wishes to family, friends, and health care providers. The specified people can then convey your decisions about end-of-life care to avoid confusion if you should become unable to communicate. Ideally, the process of discussing and writing advance directives should happen over time rather than making decisions all at once. Advance directives can be modified as your situation changes, and you can change your mind at any time, even after you have signed the advance directives. Each state has its own laws regarding advance directives. You may want to check with your health care provider,  attorney, or state representative about the law in your state. Below are some examples of advance directives. LIVING WILL A living will is a set of instructions documenting your wishes about medical care when you cannot care for yourself. It is used if you become:  Terminally ill.  Incapacitated.  Unable to communicate.  Unable to make decisions. Items to consider in your living will include:  The use or non-use of life-sustaining equipment, such as dialysis machines and breathing machines (ventilators).  A do not resuscitate (DNR) order, which is the instruction not to use cardiopulmonary resuscitation (CPR) if breathing or heartbeat stops.  Tube feeding.  Withholding of food and fluids.  Comfort (palliative) care when the goal becomes comfort rather than a cure.  Organ and tissue donation. A living will does not give instructions about distribution of your money and property if you should pass away. It is advisable to seek the expert advice of a lawyer in drawing up a will regarding your possessions. Decisions about taxes, beneficiaries, and asset distribution will be legally binding. This process can relieve your family and friends of any burdens surrounding disputes or questions that may come up about the allocation of your assets. DO NOT RESUSCITATE (DNR) A do not resuscitate (DNR) order is a request to not have CPR in the event that your heart stops beating or you stop breathing. Unless given other instructions, a health care provider will try to help any patient whose heart has stopped or who has stopped breathing.  HEALTH CARE PROXY AND DURABLE POWER OF ATTORNEY FOR HEALTH CARE A health care proxy is a person (agent) appointed to make medical decisions for you if you cannot. Generally, people choose someone they know well and trust to represent their preferences when they can no longer do so. You should be sure to ask this person for agreement to act as your agent. An agent may  have to exercise judgment in the event of a medical decision for which your wishes are not known. The durable power of attorney for health care is  the legal document that names your health care proxy. Once written, it should be:  Signed.  Notarized.  Dated.  Copied.  Witnessed.  Incorporated into your medical record. You may also want to appoint someone to manage your financial affairs if you cannot. This is called a durable power of attorney for finances. It is a separate legal document from the durable power of attorney for health care. You may choose the same person or someone different from your health care proxy to act as your agent in financial matters. Document Released: 05/21/2007 Document Revised: 02/16/2013 Document Reviewed: 07/01/2012 [facility] Patient Information 2015 [place], [place]. This information is not intended to replace advice given to you by your health care provider. Make sure you discuss any questions you have with your health care provider.      Standley Brooking. Brailee Riede M.D.

## 2014-11-01 NOTE — Patient Instructions (Addendum)
Try off the pravastatin  For 3-4 weeks to see if helps   The body aches  If  So then restart and if comes back  Then the medicaiton is probably the cause and let us know.  Will notify you  of labs when available.  Get your colon  Check as per dr Carlean Purl  Yearly flu vaccine  advanceed directives  Preventive Care for Adults A healthy lifestyle and preventive care can promote health and wellness. Preventive health guidelines for women include the following key practices.  A routine yearly check  is a good way to check with your health care provider about your health and preventive screening. It is a chance to share any concerns and updates on your health and to receive a thorough exam.  Visit your dentist for a routine exam and preventive care every 6 months. Brush your teeth twice a day and floss once a day. Good oral hygiene prevents tooth decay and gum disease.  The frequency of eye exams is based on your age, health, family medical history, use of contact lenses, and other factors. Follow your health care provider's recommendations for frequency of eye exams.  Eat a healthy diet. Foods like vegetables, fruits, whole grains, low-fat dairy products, and lean protein foods contain the nutrients you need without too many calories. Decrease your intake of foods high in solid fats, added sugars, and salt. Eat the right amount of calories for you.Get information about a proper diet from your health care provider, if necessary.  Regular physical exercise is one of the most important things you can do for your health. Most adults should get at least 150 minutes of moderate-intensity exercise (any activity that increases your heart rate and causes you to sweat) each week. In addition, most adults need muscle-strengthening exercises on 2 or more days a week.  Maintain a healthy weight. The body mass index (BMI) is a screening tool to identify possible weight problems. It provides an estimate of body fat  based on height and weight. Your health care provider can find your BMI and can help you achieve or maintain a healthy weight.For adults 20 years and older:  A BMI below 18.5 is considered underweight.  A BMI of 18.5 to 24.9 is normal.  A BMI of 25 to 29.9 is considered overweight.  A BMI of 30 and above is considered obese.  Maintain normal blood lipids and cholesterol levels by exercising and minimizing your intake of saturated fat. Eat a balanced diet with plenty of fruit and vegetables. Blood tests for lipids and cholesterol should begin at age 57 and be repeated every 5 years. If your lipid or cholesterol levels are high, you are over 50, or you are at high risk for heart disease, you may need your cholesterol levels checked more frequently.Ongoing high lipid and cholesterol levels should be treated with medicines if diet and exercise are not working.  If you smoke, find out from your health care provider how to quit. If you do not use tobacco, do not start.  Lung cancer screening is recommended for adults aged 63-80 years who are at high risk for developing lung cancer because of a history of smoking. A yearly low-dose CT scan of the lungs is recommended for people who have at least a 30-pack-year history of smoking and are a current smoker or have quit within the past 15 years. A pack year of smoking is smoking an average of 1 pack of cigarettes a day for 1  year (for example: 1 pack a day for 30 years or 2 packs a day for 15 years). Yearly screening should continue until the smoker has stopped smoking for at least 15 years. Yearly screening should be stopped for people who develop a health problem that would prevent them from having lung cancer treatment.  If you are pregnant, do not drink alcohol. If you are breastfeeding, be very cautious about drinking alcohol. If you are not pregnant and choose to drink alcohol, do not have more than 1 drink per day. One drink is considered to be 12  ounces (355 mL) of beer, 5 ounces (148 mL) of wine, or 1.5 ounces (44 mL) of liquor.  Avoid use of street drugs. Do not share needles with anyone. Ask for help if you need support or instructions about stopping the use of drugs.  High blood pressure causes heart disease and increases the risk of stroke. Your blood pressure should be checked at least every 1 to 2 years. Ongoing high blood pressure should be treated with medicines if weight loss and exercise do not work.  If you are 36-67 years old, ask your health care provider if you should take aspirin to prevent strokes.  Diabetes screening involves taking a blood sample to check your fasting blood sugar level. This should be done once every 3 years, after age 66, if you are within normal weight and without risk factors for diabetes. Testing should be considered at a younger age or be carried out more frequently if you are overweight and have at least 1 risk factor for diabetes.  Breast cancer screening is essential preventive care for women. You should practice "breast self-awareness." This means understanding the normal appearance and feel of your breasts and may include breast self-examination. Any changes detected, no matter how small, should be reported to a health care provider. Women in their 30s and 30s should have a clinical breast exam (CBE) by a health care provider as part of a regular health exam every 1 to 3 years. After age 21, women should have a CBE every year. Starting at age 15, women should consider having a mammogram (breast X-ray test) every year. Women who have a family history of breast cancer should talk to their health care provider about genetic screening. Women at a high risk of breast cancer should talk to their health care providers about having an MRI and a mammogram every year.  Breast cancer gene (BRCA)-related cancer risk assessment is recommended for women who have family members with BRCA-related cancers.  BRCA-related cancers include breast, ovarian, tubal, and peritoneal cancers. Having family members with these cancers may be associated with an increased risk for harmful changes (mutations) in the breast cancer genes BRCA1 and BRCA2. Results of the assessment will determine the need for genetic counseling and BRCA1 and BRCA2 testing.  Routine pelvic exams to screen for cancer are no longer recommended for nonpregnant women who are considered low risk for cancer of the pelvic organs (ovaries, uterus, and vagina) and who do not have symptoms. Ask your health care provider if a screening pelvic exam is right for you.  If you have had past treatment for cervical cancer or a condition that could lead to cancer, you need Pap tests and screening for cancer for at least 20 years after your treatment. If Pap tests have been discontinued, your risk factors (such as having a new sexual partner) need to be reassessed to determine if screening should be resumed. Some women  have medical problems that increase the chance of getting cervical cancer. In these cases, your health care provider may recommend more frequent screening and Pap tests.  The HPV test is an additional test that may be used for cervical cancer screening. The HPV test looks for the virus that can cause the cell changes on the cervix. The cells collected during the Pap test can be tested for HPV. The HPV test could be used to screen women aged 47 years and older, and should be used in women of any age who have unclear Pap test results. After the age of 71, women should have HPV testing at the same frequency as a Pap test.  Colorectal cancer can be detected and often prevented. Most routine colorectal cancer screening begins at the age of 75 years and continues through age 72 years. However, your health care provider may recommend screening at an earlier age if you have risk factors for colon cancer. On a yearly basis, your health care provider may  provide home test kits to check for hidden blood in the stool. Use of a small camera at the end of a tube, to directly examine the colon (sigmoidoscopy or colonoscopy), can detect the earliest forms of colorectal cancer. Talk to your health care provider about this at age 39, when routine screening begins. Direct exam of the colon should be repeated every 5-10 years through age 68 years, unless early forms of pre-cancerous polyps or small growths are found.  People who are at an increased risk for hepatitis B should be screened for this virus. You are considered at high risk for hepatitis B if:  You were born in a country where hepatitis B occurs often. Talk with your health care provider about which countries are considered high risk.  Your parents were born in a high-risk country and you have not received a shot to protect against hepatitis B (hepatitis B vaccine).  You have HIV or AIDS.  You use needles to inject street drugs.  You live with, or have sex with, someone who has hepatitis B.  You get hemodialysis treatment.  You take certain medicines for conditions like cancer, organ transplantation, and autoimmune conditions.  Hepatitis C blood testing is recommended for all people born from [birth year] through 1966 and any individual with known risks for hepatitis C.  Practice safe sex. Use condoms and avoid high-risk sexual practices to reduce the spread of sexually transmitted infections (STIs). STIs include gonorrhea, chlamydia, syphilis, trichomonas, herpes, HPV, and human immunodeficiency virus (HIV). Herpes, HIV, and HPV are viral illnesses that have no cure. They can result in disability, cancer, and death.  You should be screened for sexually transmitted illnesses (STIs) including gonorrhea and chlamydia if:  You are sexually active and are younger than 24 years.  You are older than 24 years and your health care provider tells you that you are at risk for this type of  infection.  Your sexual activity has changed since you were last screened and you are at an increased risk for chlamydia or gonorrhea. Ask your health care provider if you are at risk.  If you are at risk of being infected with HIV, it is recommended that you take a prescription medicine daily to prevent HIV infection. This is called preexposure prophylaxis (PrEP). You are considered at risk if:  You are a heterosexual woman, are sexually active, and are at increased risk for HIV infection.  You take drugs by injection.  You are sexually active  with a partner who has HIV.  Talk with your health care provider about whether you are at high risk of being infected with HIV. If you choose to begin PrEP, you should first be tested for HIV. You should then be tested every 3 months for as long as you are taking PrEP.  Osteoporosis is a disease in which the bones lose minerals and strength with aging. This can result in serious bone fractures or breaks. The risk of osteoporosis can be identified using a bone density scan. Women ages 49 years and over and women at risk for fractures or osteoporosis should discuss screening with their health care providers. Ask your health care provider whether you should take a calcium supplement or vitamin D to reduce the rate of osteoporosis.  Menopause can be associated with physical symptoms and risks. Hormone replacement therapy is available to decrease symptoms and risks. You should talk to your health care provider about whether hormone replacement therapy is right for you.  Use sunscreen. Apply sunscreen liberally and repeatedly throughout the day. You should seek shade when your shadow is shorter than you. Protect yourself by wearing long sleeves, pants, a wide-brimmed hat, and sunglasses year round, whenever you are outdoors.  Once a month, do a whole body skin exam, using a mirror to look at the skin on your back. Tell your health care provider of new moles,  moles that have irregular borders, moles that are larger than a pencil eraser, or moles that have changed in shape or color.  Stay current with required vaccines (immunizations).  Influenza vaccine. All adults should be immunized every year.  Tetanus, diphtheria, and acellular pertussis (Td, Tdap) vaccine. Pregnant women should receive 1 dose of Tdap vaccine during each pregnancy. The dose should be obtained regardless of the length of time since the last dose. Immunization is preferred during the 27th-36th week of gestation. An adult who has not previously received Tdap or who does not know her vaccine status should receive 1 dose of Tdap. This initial dose should be followed by tetanus and diphtheria toxoids (Td) booster doses every 10 years. Adults with an unknown or incomplete history of completing a 3-dose immunization series with Td-containing vaccines should begin or complete a primary immunization series including a Tdap dose. Adults should receive a Td booster every 10 years.  Varicella vaccine. An adult without evidence of immunity to varicella should receive 2 doses or a second dose if she has previously received 1 dose. Pregnant females who do not have evidence of immunity should receive the first dose after pregnancy. This first dose should be obtained before leaving the health care facility. The second dose should be obtained 4-8 weeks after the first dose.  Human papillomavirus (HPV) vaccine. Females aged 13-26 years who have not received the vaccine previously should obtain the 3-dose series. The vaccine is not recommended for use in pregnant females. However, pregnancy testing is not needed before receiving a dose. If a female is found to be pregnant after receiving a dose, no treatment is needed. In that case, the remaining doses should be delayed until after the pregnancy. Immunization is recommended for any person with an immunocompromised condition through the age of 14 years if she  did not get any or all doses earlier. During the 3-dose series, the second dose should be obtained 4-8 weeks after the first dose. The third dose should be obtained 24 weeks after the first dose and 16 weeks after the second dose.  Zoster  vaccine. One dose is recommended for adults aged 30 years or older unless certain conditions are present.  Measles, mumps, and rubella (MMR) vaccine. Adults born before [birth year] generally are considered immune to measles and mumps. Adults born in [birth year] or later should have 1 or more doses of MMR vaccine unless there is a contraindication to the vaccine or there is laboratory evidence of immunity to each of the three diseases. A routine second dose of MMR vaccine should be obtained at least 28 days after the first dose for students attending postsecondary schools, health care workers, or international travelers. People who received inactivated measles vaccine or an unknown type of measles vaccine during 1963-1967 should receive 2 doses of MMR vaccine. People who received inactivated mumps vaccine or an unknown type of mumps vaccine before 1979 and are at high risk for mumps infection should consider immunization with 2 doses of MMR vaccine. For females of childbearing age, rubella immunity should be determined. If there is no evidence of immunity, females who are not pregnant should be vaccinated. If there is no evidence of immunity, females who are pregnant should delay immunization until after pregnancy. Unvaccinated health care workers born before [birth year] who lack laboratory evidence of measles, mumps, or rubella immunity or laboratory confirmation of disease should consider measles and mumps immunization with 2 doses of MMR vaccine or rubella immunization with 1 dose of MMR vaccine.  Pneumococcal 13-valent conjugate (PCV13) vaccine. When indicated, a person who is uncertain of her immunization history and has no record of immunization should receive the PCV13 vaccine. An adult  aged 28 years or older who has certain medical conditions and has not been previously immunized should receive 1 dose of PCV13 vaccine. This PCV13 should be followed with a dose of pneumococcal polysaccharide (PPSV23) vaccine. The PPSV23 vaccine dose should be obtained at least 8 weeks after the dose of PCV13 vaccine. An adult aged 29 years or older who has certain medical conditions and previously received 1 or more doses of PPSV23 vaccine should receive 1 dose of PCV13. The PCV13 vaccine dose should be obtained 1 or more years after the last PPSV23 vaccine dose.  Pneumococcal polysaccharide (PPSV23) vaccine. When PCV13 is also indicated, PCV13 should be obtained first. All adults aged 80 years and older should be immunized. An adult younger than age 56 years who has certain medical conditions should be immunized. Any person who resides in a nursing home or long-term care facility should be immunized. An adult smoker should be immunized. People with an immunocompromised condition and certain other conditions should receive both PCV13 and PPSV23 vaccines. People with human immunodeficiency virus (HIV) infection should be immunized as soon as possible after diagnosis. Immunization during chemotherapy or radiation therapy should be avoided. Routine use of PPSV23 vaccine is not recommended for American Indians, [place] Natives, or people younger than 65 years unless there are medical conditions that require PPSV23 vaccine. When indicated, people who have unknown immunization and have no record of immunization should receive PPSV23 vaccine. One-time revaccination 5 years after the first dose of PPSV23 is recommended for people aged 19-64 years who have chronic kidney failure, nephrotic syndrome, asplenia, or immunocompromised conditions. People who received 1-2 doses of PPSV23 before age 48 years should receive another dose of PPSV23 vaccine at age 49 years or later if at least 5 years have passed since the previous  dose. Doses of PPSV23 are not needed for people immunized with PPSV23 at or after age 51 years.  Meningococcal vaccine.  Adults with asplenia or persistent complement component deficiencies should receive 2 doses of quadrivalent meningococcal conjugate (MenACWY-D) vaccine. The doses should be obtained at least 2 months apart. Microbiologists working with certain meningococcal bacteria, [place] recruits, people at risk during an outbreak, and people who travel to or live in countries with a high rate of meningitis should be immunized. A first-year college student up through age 43 years who is living in a residence hall should receive a dose if she did not receive a dose on or after her 16th birthday. Adults who have certain high-risk conditions should receive one or more doses of vaccine.  Hepatitis A vaccine. Adults who wish to be protected from this disease, have certain high-risk conditions, work with hepatitis A-infected animals, work in hepatitis A research labs, or travel to or work in countries with a high rate of hepatitis A should be immunized. Adults who were previously unvaccinated and who anticipate close contact with an international adoptee during the first 60 days after arrival in the [place] from a country with a high rate of hepatitis A should be immunized.  Hepatitis B vaccine. Adults who wish to be protected from this disease, have certain high-risk conditions, may be exposed to blood or other infectious body fluids, are household contacts or sex partners of hepatitis B positive people, are clients or workers in certain care facilities, or travel to or work in countries with a high rate of hepatitis B should be immunized.  Haemophilus influenzae type b (Hib) vaccine. A previously unvaccinated person with asplenia or sickle cell disease or having a scheduled splenectomy should receive 1 dose of Hib vaccine. Regardless of previous immunization, a recipient of a hematopoietic stem cell  transplant should receive a 3-dose series 6-12 months after her successful transplant. Hib vaccine is not recommended for adults with HIV infection. Preventive Services / Frequency   Ages 1 years and over  Blood pressure check.** / Every 1 to 2 years.  Lipid and cholesterol check.** / Every 5 years beginning at age 6 years.  Lung cancer screening. / Every year if you are aged 23-80 years and have a 30-pack-year history of smoking and currently smoke or have quit within the past 15 years. Yearly screening is stopped once you have quit smoking for at least 15 years or develop a health problem that would prevent you from having lung cancer treatment.  Clinical breast exam.** / Every year after age 55 years.  BRCA-related cancer risk assessment.** / For women who have family members with a BRCA-related cancer (breast, ovarian, tubal, or peritoneal cancers).  Mammogram.** / Every year beginning at age 79 years and continuing for as long as you are in good health. Consult with your health care provider.  Pap test.** / Every 3 years starting at age 66 years through age 62 or 28 years with 3 consecutive normal Pap tests. Testing can be stopped between 65 and 70 years with 3 consecutive normal Pap tests and no abnormal Pap or HPV tests in the past 10 years.  HPV screening.** / Every 3 years from ages 55 years through ages 15 or 15 years with a history of 3 consecutive normal Pap tests. Testing can be stopped between 65 and 70 years with 3 consecutive normal Pap tests and no abnormal Pap or HPV tests in the past 10 years.  Fecal occult blood test (FOBT) of stool. / Every year beginning at age 62 years and continuing until age 60 years. You may not need  to do this test if you get a colonoscopy every 10 years.  Flexible sigmoidoscopy or colonoscopy.** / Every 5 years for a flexible sigmoidoscopy or every 10 years for a colonoscopy beginning at age 62 years and continuing until age 46  years.  Hepatitis C blood test.** / For all people born from [birth year] through 1966 and any individual with known risks for hepatitis C.  Osteoporosis screening.** / A one-time screening for women ages 38 years and over and women at risk for fractures or osteoporosis.  Skin self-exam. / Monthly.  Influenza vaccine. / Every year.  Tetanus, diphtheria, and acellular pertussis (Tdap/Td) vaccine.** / 1 dose of Td every 10 years.  Varicella vaccine.** / Consult your health care provider.  Zoster vaccine.** / 1 dose for adults aged 51 years or older.  Pneumococcal 13-valent conjugate (PCV13) vaccine.** / Consult your health care provider.  Pneumococcal polysaccharide (PPSV23) vaccine.** / 1 dose for all adults aged 72 years and older.  Meningococcal vaccine.** / Consult your health care provider.  Hepatitis A vaccine.** / Consult your health care provider.  Hepatitis B vaccine.** / Consult your health care provider.  Haemophilus influenzae type b (Hib) vaccine.** / Consult your health care provider. ** Family history and personal history of risk and conditions may change your health care provider's recommendations. Document Released: 04/09/2001 Document Revised: 06/28/2013 Document Reviewed: 07/09/2010 [facility] Patient Information 2015 [place], [place]. This information is not intended to replace advice given to you by your health care provider. Make sure you discuss any questions you have with your health care provider.  Advance Directive Advance directives are the legal documents that allow you to make choices about your health care and medical treatment if you cannot speak for yourself. Advance directives are a way for you to communicate your wishes to family, friends, and health care providers. The specified people can then convey your decisions about end-of-life care to avoid confusion if you should become unable to communicate. Ideally, the process of discussing and writing advance  directives should happen over time rather than making decisions all at once. Advance directives can be modified as your situation changes, and you can change your mind at any time, even after you have signed the advance directives. Each state has its own laws regarding advance directives. You may want to check with your health care provider, attorney, or state representative about the law in your state. Below are some examples of advance directives. LIVING WILL A living will is a set of instructions documenting your wishes about medical care when you cannot care for yourself. It is used if you become:  Terminally ill.  Incapacitated.  Unable to communicate.  Unable to make decisions. Items to consider in your living will include:  The use or non-use of life-sustaining equipment, such as dialysis machines and breathing machines (ventilators).  A do not resuscitate (DNR) order, which is the instruction not to use cardiopulmonary resuscitation (CPR) if breathing or heartbeat stops.  Tube feeding.  Withholding of food and fluids.  Comfort (palliative) care when the goal becomes comfort rather than a cure.  Organ and tissue donation. A living will does not give instructions about distribution of your money and property if you should pass away. It is advisable to seek the expert advice of a lawyer in drawing up a will regarding your possessions. Decisions about taxes, beneficiaries, and asset distribution will be legally binding. This process can relieve your family and friends of any burdens surrounding disputes or questions that may  come up about the allocation of your assets. DO NOT RESUSCITATE (DNR) A do not resuscitate (DNR) order is a request to not have CPR in the event that your heart stops beating or you stop breathing. Unless given other instructions, a health care provider will try to help any patient whose heart has stopped or who has stopped breathing.  HEALTH CARE PROXY AND  DURABLE POWER OF ATTORNEY FOR HEALTH CARE A health care proxy is a person (agent) appointed to make medical decisions for you if you cannot. Generally, people choose someone they know well and trust to represent their preferences when they can no longer do so. You should be sure to ask this person for agreement to act as your agent. An agent may have to exercise judgment in the event of a medical decision for which your wishes are not known. The durable power of attorney for health care is the legal document that names your health care proxy. Once written, it should be:  Signed.  Notarized.  Dated.  Copied.  Witnessed.  Incorporated into your medical record. You may also want to appoint someone to manage your financial affairs if you cannot. This is called a durable power of attorney for finances. It is a separate legal document from the durable power of attorney for health care. You may choose the same person or someone different from your health care proxy to act as your agent in financial matters. Document Released: 05/21/2007 Document Revised: 02/16/2013 Document Reviewed: 07/01/2012 [facility] Patient Information 2015 [place], [place]. This information is not intended to replace advice given to you by your health care provider. Make sure you discuss any questions you have with your health care provider.

## 2014-11-01 NOTE — Addendum Note (Signed)
Addended by: Miles Costain T on: 11/01/2014 10:35 AM   Modules accepted: Orders

## 2014-11-18 ENCOUNTER — Encounter: Payer: Self-pay | Admitting: Family Medicine

## 2014-11-21 ENCOUNTER — Telehealth: Payer: Self-pay | Admitting: Internal Medicine

## 2014-11-21 NOTE — Telephone Encounter (Signed)
Pt would like blood work results °

## 2014-11-21 NOTE — Telephone Encounter (Signed)
Attempted to contact patient but was unable to reach. I left a message for patient to return my phone call regarding lab results.

## 2014-11-22 NOTE — Telephone Encounter (Signed)
Patient notified of lab results

## 2014-12-20 ENCOUNTER — Other Ambulatory Visit: Payer: Self-pay | Admitting: Internal Medicine

## 2014-12-22 NOTE — Telephone Encounter (Signed)
Sent to the pharmacy by e-scribe. 

## 2014-12-26 ENCOUNTER — Other Ambulatory Visit: Payer: Self-pay | Admitting: Internal Medicine

## 2014-12-28 NOTE — Telephone Encounter (Signed)
Sent to the pharmacy by e-scribe. 

## 2015-01-25 ENCOUNTER — Other Ambulatory Visit: Payer: Self-pay | Admitting: Internal Medicine

## 2015-01-25 NOTE — Telephone Encounter (Signed)
Cynthia Estrada called to see if Dr. Regis Bill will give her a Rx for Hydrocodone. Please call the pt if you can.   Pt ph# [phone] Thank you.

## 2015-01-25 NOTE — Telephone Encounter (Signed)
Spoke to the pt.  She is uses the hydrocodone for arthritis.  Uses only as needed.  States she has only taken 2 this month but aching badly.  Please advise.  Thanks!

## 2015-01-26 MED ORDER — HYDROCODONE-ACETAMINOPHEN 7.5-325 MG PO TABS: ORAL_TABLET | ORAL | Status: DC

## 2015-01-26 NOTE — Telephone Encounter (Signed)
Ok x 1

## 2015-03-07 ENCOUNTER — Encounter: Payer: Self-pay | Admitting: Internal Medicine

## 2015-03-07 ENCOUNTER — Ambulatory Visit (INDEPENDENT_AMBULATORY_CARE_PROVIDER_SITE_OTHER): Payer: Medicare Other | Admitting: Internal Medicine

## 2015-03-07 VITALS — BP 140/72 | Temp 98.4°F | Ht 65.0 in | Wt 235.9 lb

## 2015-03-07 DIAGNOSIS — I10 Essential (primary) hypertension: Secondary | ICD-10-CM

## 2015-03-07 DIAGNOSIS — E782 Mixed hyperlipidemia: Secondary | ICD-10-CM

## 2015-03-07 DIAGNOSIS — R2991 Unspecified symptoms and signs involving the musculoskeletal system: Secondary | ICD-10-CM

## 2015-03-07 DIAGNOSIS — Z79899 Other long term (current) drug therapy: Secondary | ICD-10-CM | POA: Diagnosis not present

## 2015-03-07 DIAGNOSIS — Z789 Other specified health status: Secondary | ICD-10-CM

## 2015-03-07 DIAGNOSIS — Z889 Allergy status to unspecified drugs, medicaments and biological substances status: Secondary | ICD-10-CM

## 2015-03-07 LAB — LIPID PANEL
CHOL/HDL RATIO: 5
Cholesterol: 309 mg/dL — ABNORMAL HIGH (ref 0–200)
HDL: 64.5 mg/dL (ref >39.00)
LDL CALC: 208 mg/dL — AB (ref 0–99)
NONHDL: 244.2
Triglycerides: 183 mg/dL — ABNORMAL HIGH (ref 0.0–149.0)
VLDL: 36.6 mg/dL (ref 0.0–40.0)

## 2015-03-07 LAB — SEDIMENTATION RATE: Sed Rate: 15 mm/hr (ref 0–22)

## 2015-03-07 NOTE — Progress Notes (Signed)
Pre visit review using our clinic review tool, if applicable. No additional management support is needed unless otherwise documented below in the visit note.  Chief Complaint  Patient presents with  . Follow-up    HPI: Cynthia Estrada 70 y.o.  comes in for chronic disease/ medication management  Lipid:   Better  After a few weeks stopping prava and then began  again after started . Days to week  Still has some stiffness but better off than off   Pain last reill 12 1  For  Pain med  Not reg use  tizanidine BP:  Taking med no sx   ROS: See pertinent positives and negatives per HPI. Itchy patch abd and left neck  Check lef tear ached for a day no pain issues now.   Past Medical History  Diagnosis Date  . Unspecified vitamin D deficiency 02/10/2007  . HYPERLIPIDEMIA 01/12/2007  . ANXIETY 11/11/2006  . HYPERTENSION 11/11/2006  . HOT FLASHES 04/07/2008  . UNSPECIFIED ARTHROPATHY SITE UNSPECIFIED 01/12/2007  . BACK PAIN, LUMBAR 07/11/2008  . LEG CRAMPS 01/12/2009  . FASTING HYPERGLYCEMIA 01/12/2007  . SYNCOPE, HX OF 02/10/2007  . History of varicella   . Arthritis   . 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 Normal stress echocardiogram 2012  . History of CT scan of chest   . Chest pain 06/12/2010    Ok stress echo has Gi sx also cw gerd.     Family History  Problem Relation Age of Onset  . Hypertension Mother   . Diabetes Mother   . Hypertension Sister   . Hypertension Brother   . Hyperlipidemia Brother   . Colon cancer Neg Hx   . Pancreatic cancer Neg Hx   . Rectal cancer Neg Hx   . Stomach cancer Neg Hx     Social History   Social History  . Marital Status: Single    Spouse Name: N/A  . Number of Children: N/A  . Years of Education: N/A   Occupational History  . [employer]   Social History Main Topics  . Smoking status: Never Smoker   . Smokeless tobacco: Never Used  . Alcohol Use: No  . Drug Use: No  . Sexual  Activity: Not Asked   Other Topics Concern  . 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     Outpatient Prescriptions Prior to Visit  Medication Sig Dispense Refill  . Ascorbic Acid (VITAMIN C) 500 MG tablet Take 500 mg by mouth daily.      . cholecalciferol (VITAMIN D) 1000 UNIT tablet Take 1,000 Units by mouth daily.      Marland Kitchen DEXILANT 60 MG capsule TAKE ONE CAPSULE BY MOUTH ONCE DAILY 30 capsule 8  . HYDROcodone-acetaminophen (NORCO) 7.5-325 MG tablet TAKE ONE TO TWO TABLETS BY MOUTH ONCE DAILY AS NEEDED 30 tablet 0  . losartan-hydrochlorothiazide (HYZAAR) 50-12.5 MG tablet TAKE ONE TABLET BY MOUTH ONCE DAILY 30 tablet 5  . aspirin 81 MG tablet Take 81 mg by mouth daily. Reported on 03/07/2015    . pravastatin (PRAVACHOL) 20 MG tablet TAKE ONE TABLET BY MOUTH ONCE DAILY (Patient not taking: Reported on 03/07/2015) 30 tablet 0  .  tiZANidine (ZANAFLEX) 4 MG tablet TAKE ONE TABLET BY MOUTH EVERY 8 HOURS AS NEEDED FOR  BACK  SPASMS (Patient not taking: Reported on 03/07/2015) 30 tablet 0   No facility-administered medications prior to visit.     EXAM:  BP 140/72 mmHg  Temp(Src) 98.4 F (36.9 C) (Oral)  Ht 5\' 5"  (1.651 m)  Wt 235 lb 14.4 oz (107.004 kg)  BMI 39.26 kg/m2  Body mass index is 39.26 kg/(m^2).  GENERAL: vitals reviewed and listed above, alert, oriented, appears well hydrated and in no acute distress HEENT: atraumatic, conjunctiva  clear, no obvious abnormalities on inspection of external nose and ears left eac nl tm intact  OP : no lesion edema or exudate  NECK: no obvious masses on inspection palpation  Non tender  CV: HRRR, no clubbing cyanosis  MS: moves all extremities without noticeable focal  Abnormality Skin faint red patch mid agd and left shoulder  No vesicle or pustules PSYCH:  pleasant and cooperative, no obvious depression or anxiety Lab Results  Component Value Date   WBC 4.1 11/01/2014   HGB 12.6 11/01/2014   HCT 39.2 11/01/2014   PLT 188.0 11/01/2014   GLUCOSE 108* 11/01/2014   CHOL 229* 11/01/2014   TRIG 148.0 11/01/2014   HDL 64.70 11/01/2014   LDLDIRECT 131.2 10/27/2012   LDLCALC 135* 11/01/2014   ALT 21 11/01/2014   AST 19 11/01/2014   NA 143 11/01/2014   K 4.4 11/01/2014   CL 105 11/01/2014   CREATININE 1.17 11/01/2014   BUN 13 11/01/2014   CO2 29 11/01/2014   TSH 1.00 11/01/2014   HGBA1C 6.1 10/29/2013   BP Readings from Last 3 Encounters:  03/07/15 140/72  11/01/14 136/70  12/02/13 150/76   Fastin labs   ASSESSMENT AND PLAN:  Discussed the following assessment and plan:  Mixed hyperlipidemia - Plan: Lipid panel  Statin intolerance  Medication management  Musculoskeletal symptom - Plan: Sedimentation rate  Essential hypertension  Trial pravastatin  Seems to have mx side effects as with past meds statin  optinos discussed  Will check lipid today consider zetia if benefit mor than risk assessed  Follow hyperglycemia lsi  Rash may be dry skin local care  Ear exam normal  Total visit 69mins > 50% spent counseling and coordinating care as indicated in above note and in instructions to patient .  -Patient advised to return or notify health care team  if symptoms worsen ,persist or new concerns arise.  Patient Instructions  Will notify you  of labs when available.  Depending on results considier other cholesterol medication  zetia that is in a different class of meds and less likely to cause muscle aches . It appears you jhave side effects  From statin medication.  Continue avoiding sugars and processed foods.  blood pressure goal is below 140/90  Plan fu depending on results of above  .    Standley Brooking. Cynthia Estrada M.D.

## 2015-03-07 NOTE — Patient Instructions (Signed)
Will notify you  of labs when available.  Depending on results considier other cholesterol medication  zetia that is in a different class of meds and less likely to cause muscle aches . It appears you jhave side effects  From statin medication.  Continue avoiding sugars and processed foods.  blood pressure goal is below 140/90  Plan fu depending on results of above  .

## 2015-03-09 ENCOUNTER — Other Ambulatory Visit: Payer: Self-pay | Admitting: Family Medicine

## 2015-03-09 ENCOUNTER — Encounter: Payer: Self-pay | Admitting: Family Medicine

## 2015-03-09 MED ORDER — EZETIMIBE 10 MG PO TABS: 10.0000 mg | ORAL_TABLET | Freq: Every day | ORAL | Status: DC

## 2015-04-24 ENCOUNTER — Encounter: Payer: Self-pay | Admitting: Internal Medicine

## 2015-04-24 ENCOUNTER — Ambulatory Visit (INDEPENDENT_AMBULATORY_CARE_PROVIDER_SITE_OTHER): Payer: Medicare Other | Admitting: Internal Medicine

## 2015-04-24 VITALS — BP 164/90 | Temp 98.8°F | Ht 65.0 in | Wt 239.1 lb

## 2015-04-24 DIAGNOSIS — I10 Essential (primary) hypertension: Secondary | ICD-10-CM | POA: Diagnosis not present

## 2015-04-24 DIAGNOSIS — L299 Pruritus, unspecified: Secondary | ICD-10-CM | POA: Diagnosis not present

## 2015-04-24 DIAGNOSIS — Z79899 Other long term (current) drug therapy: Secondary | ICD-10-CM | POA: Diagnosis not present

## 2015-04-24 DIAGNOSIS — L509 Urticaria, unspecified: Secondary | ICD-10-CM

## 2015-04-24 MED ORDER — HYDROXYZINE HCL 10 MG PO TABS: 10.0000 mg | ORAL_TABLET | Freq: Every evening | ORAL | Status: DC | PRN

## 2015-04-24 MED ORDER — PREDNISONE 10 MG PO TABS: ORAL_TABLET | ORAL | Status: DC

## 2015-04-24 NOTE — Patient Instructions (Signed)
A do look like hives lUncertain cause dont think its the new medication but consider this if ongoing.     Take zyrtec plain once a day  And can add on n hydroxizine at   Night for itching  9 can cause drowsiness)   If not fading then add the prednisone  Course . If not gone or  persistent or progressive then plan ROV.  To reassess.    Hives Hives are itchy, red, swollen areas of the skin. They can vary in size and location on your body. Hives can come and go for hours or several days (acute hives) or for several weeks (chronic hives). Hives do not spread from person to person (noncontagious). They may get worse with scratching, exercise, and emotional stress. CAUSES   Allergic reaction to food, additives, or drugs.  Infections, including the common cold.  Illness, such as vasculitis, lupus, or thyroid disease.  Exposure to sunlight, heat, or cold.  Exercise.  Stress.  Contact with chemicals. SYMPTOMS   Red or white swollen patches on the skin. The patches may change size, shape, and location quickly and repeatedly.  Itching.  Swelling of the hands, feet, and face. This may occur if hives develop deeper in the skin. DIAGNOSIS  Your caregiver can usually tell what is wrong by performing a physical exam. Skin or blood tests may also be done to determine the cause of your hives. In some cases, the cause cannot be determined. TREATMENT  Mild cases usually get better with medicines such as antihistamines. Severe cases may require an emergency epinephrine injection. If the cause of your hives is known, treatment includes avoiding that trigger.  HOME CARE INSTRUCTIONS   Avoid causes that trigger your hives.  Take antihistamines as directed by your caregiver to reduce the severity of your hives. Non-sedating or low-sedating antihistamines are usually recommended. Do not drive while taking an antihistamine.  Take any other medicines prescribed for itching as directed by your  caregiver.  Wear loose-fitting clothing.  Keep all follow-up appointments as directed by your caregiver. SEEK MEDICAL CARE IF:   You have persistent or severe itching that is not relieved with medicine.  You have painful or swollen joints. SEEK IMMEDIATE MEDICAL CARE IF:   You have a fever.  Your tongue or lips are swollen.  You have trouble breathing or swallowing.  You feel tightness in the throat or chest.  You have abdominal pain. These problems may be the first sign of a life-threatening allergic reaction. Call your local emergency services (911 in [place] MAKE SURE YOU:   Understand these instructions.  Will watch your condition.  Will get help right away if you are not doing well or get worse.   This information is not intended to replace advice given to you by your health care provider. Make sure you discuss any questions you have with your health care provider.   Document Released: 02/11/2005 Document Revised: 02/16/2013 Document Reviewed: 05/07/2011 Elsevier Interactive Patient Education [employer].

## 2015-04-24 NOTE — Progress Notes (Signed)
Pre visit review using our clinic review tool, if applicable. No additional management support is needed unless otherwise documented below in the visit note.   Chief Complaint  Patient presents with  . Rash    HPI: Patient Cynthia Estrada  comes in today for SDA for  new problem evaluation.  began on weekend  Sat am 36 hours ago when work up had  Itching all over musltuy left side was red  Blotches  And  Left side .   Took hydrocream.    No shortness of breath respiratory symptoms or edema. Was itching and tingling and worse when she scratched. Waxing and waning since then   No worse didn't sleep from itching last night .   In sleep .   And had some diarrhea . No vomiting. Seems to be some better today. She has taken no medicine for this. In the past Benadryl is caused rapid heart rate.  Some recnet sneezing no other allergy symptoms. Hasn't taken hydrocodone recently. Doesn't remember being exposed the day before even though the weather was good she wasn't in the garden. Coworkers had some red blotches on her skin also. She's been on the new cholesterol medicines that he for about a month. Doesn't seem to be related. She has not had this problem before no fever or chills UTI respiratory. ROS: See pertinent positives and negatives per HPI.  Past Medical History  Diagnosis Date  . Unspecified vitamin D deficiency 02/10/2007  . HYPERLIPIDEMIA 01/12/2007  . ANXIETY 11/11/2006  . HYPERTENSION 11/11/2006  . HOT FLASHES 04/07/2008  . UNSPECIFIED ARTHROPATHY SITE UNSPECIFIED 01/12/2007  . BACK PAIN, LUMBAR 07/11/2008  . LEG CRAMPS 01/12/2009  . FASTING HYPERGLYCEMIA 01/12/2007  . SYNCOPE, HX OF 02/10/2007  . History of varicella   . Arthritis   . 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 Normal stress echocardiogram 2012  . History of CT scan of chest   . Chest pain 06/12/2010    Ok stress echo has Gi sx also cw gerd.     Family History  Problem Relation Age of  Onset  . Hypertension Mother   . Diabetes Mother   . Hypertension Sister   . Hypertension Brother   . Hyperlipidemia Brother   . Colon cancer Neg Hx   . Pancreatic cancer Neg Hx   . Rectal cancer Neg Hx   . Stomach cancer Neg Hx     Social History   Social History  . Marital Status: Single    Spouse Name: N/A  . Number of Children: N/A  . Years of Education: N/A   Occupational History  . [place]   Social History Main Topics  . Smoking status: Never Smoker   . Smokeless tobacco: Never Used  . Alcohol Use: No  . Drug Use: No  . Sexual Activity: Not Asked   Other Topics Concern  . 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     Outpatient Prescriptions Prior to Visit  Medication Sig Dispense Refill  . Ascorbic Acid (VITAMIN C) 500 MG tablet Take 500 mg by mouth daily.      Marland Kitchen aspirin 81 MG tablet Take 81 mg by  mouth daily. Reported on 03/07/2015    . cholecalciferol (VITAMIN D) 1000 UNIT tablet Take 1,000 Units by mouth daily.      Marland Kitchen DEXILANT 60 MG capsule TAKE ONE CAPSULE BY MOUTH ONCE DAILY 30 capsule 8  . ezetimibe (ZETIA) 10 MG tablet Take 1 tablet (10 mg total) by mouth daily. 30 tablet 5  . HYDROcodone-acetaminophen (NORCO) 7.5-325 MG tablet TAKE ONE TO TWO TABLETS BY MOUTH ONCE DAILY AS NEEDED 30 tablet 0  . losartan-hydrochlorothiazide (HYZAAR) 50-12.5 MG tablet TAKE ONE TABLET BY MOUTH ONCE DAILY 30 tablet 5   No facility-administered medications prior to visit.     EXAM:  BP 164/90 mmHg  Temp(Src) 98.8 F (37.1 C) (Oral)  Ht 5\' 5"  (1.651 m)  Wt 239 lb 1.6 oz (108.455 kg)  BMI 39.79 kg/m2  Body mass index is 39.79 kg/(m^2).  GENERAL: vitals reviewed and listed above, alert, oriented, appears well hydrated and in no acute distress HEENT: atraumatic,  conjunctiva  clear, no obvious abnormalities on inspection of external nose and ears OP : no lesion edema or exudate  NECK: no obvious masses on inspection palpation  LUNGS: clear to auscultation bilaterally, no wheezes, rales or rhonchi, good air movement CV: HRRR, no clubbing cyanosis or  peripheral edema nl cap refill  Examination of skin so some red blotches left shoulder and left arm without pustules or vesicles or papules. There are also some blotches palpable blanching round on her left lower extremities lateral. There is no angioedema no palmar or sole rash. MS: moves all extremities PSYCH: pleasant and cooperative, no obvious depression or anxiety  ASSESSMENT AND PLAN:  Discussed the following assessment and plan:  Hives  Itching  Medication management  Essential hypertension Hive-like reaction although is curious that is more on the left side of her body than the right she states is getting somewhat better no associated cardiovascular respiratory symptoms will try antihistamines hydroxyzine at night with caution and add a prednisone course if not responding. Michela Pitcher he is in your medicine but doesn't correlate with the timing although consider stopping if ongoing problem. She is on losartan has not had a problem with this and this doesn't look like angioedema. -Patient advised to return or notify health care team  if symptoms worsen ,persist or new concerns arise. Total visit 19mins > 50% spent counseling and coordinating care as indicated in above note and in instructions to patient .    Patient Instructions  A do look like hives lUncertain cause dont think its the new medication but consider this if ongoing.     Take zyrtec plain once a day  And can add on n hydroxizine at   Night for itching  9 can cause drowsiness)   If not fading then add the prednisone  Course . If not gone or  persistent or progressive then plan ROV.  To reassess.    Hives Hives are itchy, red,  swollen areas of the skin. They can vary in size and location on your body. Hives can come and go for hours or several days (acute hives) or for several weeks (chronic hives). Hives do not spread from person to person (noncontagious). They may get worse with scratching, exercise, and emotional stress. CAUSES   Allergic reaction to food, additives, or drugs.  Infections, including the common cold.  Illness, such as vasculitis, lupus, or thyroid disease.  Exposure to sunlight, heat, or cold.  Exercise.  Stress.  Contact with chemicals. SYMPTOMS   Red or  white swollen patches on the skin. The patches may change size, shape, and location quickly and repeatedly.  Itching.  Swelling of the hands, feet, and face. This may occur if hives develop deeper in the skin. DIAGNOSIS  Your caregiver can usually tell what is wrong by performing a physical exam. Skin or blood tests may also be done to determine the cause of your hives. In some cases, the cause cannot be determined. TREATMENT  Mild cases usually get better with medicines such as antihistamines. Severe cases may require an emergency epinephrine injection. If the cause of your hives is known, treatment includes avoiding that trigger.  HOME CARE INSTRUCTIONS   Avoid causes that trigger your hives.  Take antihistamines as directed by your caregiver to reduce the severity of your hives. Non-sedating or low-sedating antihistamines are usually recommended. Do not drive while taking an antihistamine.  Take any other medicines prescribed for itching as directed by your caregiver.  Wear loose-fitting clothing.  Keep all follow-up appointments as directed by your caregiver. SEEK MEDICAL CARE IF:   You have persistent or severe itching that is not relieved with medicine.  You have painful or swollen joints. SEEK IMMEDIATE MEDICAL CARE IF:   You have a fever.  Your tongue or lips are swollen.  You have trouble breathing or  swallowing.  You feel tightness in the throat or chest.  You have abdominal pain. These problems may be the first sign of a life-threatening allergic reaction. Call your local emergency services (911 in [place] MAKE SURE YOU:   Understand these instructions.  Will watch your condition.  Will get help right away if you are not doing well or get worse.   This information is not intended to replace advice given to you by your health care provider. Make sure you discuss any questions you have with your health care provider.   Document Released: 02/11/2005 Document Revised: 02/16/2013 Document Reviewed: 05/07/2011 Elsevier Interactive Patient Education 2016 [place] K. Kenly Xiao M.D.

## 2015-06-02 ENCOUNTER — Telehealth: Payer: Self-pay | Admitting: Internal Medicine

## 2015-06-02 ENCOUNTER — Other Ambulatory Visit (INDEPENDENT_AMBULATORY_CARE_PROVIDER_SITE_OTHER): Payer: Medicare Other

## 2015-06-02 DIAGNOSIS — E785 Hyperlipidemia, unspecified: Secondary | ICD-10-CM

## 2015-06-02 LAB — LIPID PANEL
CHOLESTEROL: 239 mg/dL — AB (ref 0–200)
HDL: 78.7 mg/dL (ref >39.00)
LDL CALC: 134 mg/dL — AB (ref 0–99)
NonHDL: 160.06
Total CHOL/HDL Ratio: 3
Triglycerides: 129 mg/dL (ref 0.0–149.0)
VLDL: 25.8 mg/dL (ref 0.0–40.0)

## 2015-06-02 MED ORDER — HYDROCODONE-ACETAMINOPHEN 7.5-325 MG PO TABS: ORAL_TABLET | ORAL | Status: DC

## 2015-06-02 NOTE — Telephone Encounter (Addendum)
Pt need new Rx for Hydrocodone. Ok to refill x 1

## 2015-06-02 NOTE — Telephone Encounter (Signed)
Pt notified to pick up at the front desk. 

## 2015-06-06 NOTE — Telephone Encounter (Signed)
Encounter closed

## 2015-06-08 ENCOUNTER — Telehealth: Payer: Self-pay | Admitting: Internal Medicine

## 2015-06-08 NOTE — Telephone Encounter (Signed)
Pt returning your call Pt would like results of labs. Please call back. °

## 2015-06-08 NOTE — Telephone Encounter (Signed)
Patient is aware of lab results.

## 2015-06-22 ENCOUNTER — Other Ambulatory Visit: Payer: Self-pay | Admitting: Internal Medicine

## 2015-06-22 NOTE — Telephone Encounter (Signed)
Sent to the pharmacy by e-scribe. 

## 2015-08-07 ENCOUNTER — Telehealth: Payer: Self-pay | Admitting: Internal Medicine

## 2015-08-07 NOTE — Telephone Encounter (Signed)
Pt made appt. Med requested not prescribed since 2012.

## 2015-08-10 NOTE — Progress Notes (Signed)
Pre visit review using our clinic review tool, if applicable. No additional management support is needed unless otherwise documented below in the visit note.  Chief Complaint  Patient presents with  . Pain in left buttock down leg    HPI: Cynthia Estrada 70 y.o.  Comes in for acute visit   Onset   stiitin  Buttock radiating   Eased up some and then came back shooting pain .    Powerful pain. Never had this before.   Tried pain med  Second day   Since then better  So far but wants to check tos ee if important . No fever weakness  .  No injury change .    Only takes pain med  if hurting  Has 5 left has bottle  No numbness weakness    Radiation only to leg not foot ROS: See pertinent positives and negatives per HPI. No bowel or bladder changes no weakness or unusual rashes.  Past Medical History  Diagnosis Date  . Unspecified vitamin D deficiency 02/10/2007  . HYPERLIPIDEMIA 01/12/2007  . ANXIETY 11/11/2006  . HYPERTENSION 11/11/2006  . HOT FLASHES 04/07/2008  . UNSPECIFIED ARTHROPATHY SITE UNSPECIFIED 01/12/2007  . BACK PAIN, LUMBAR 07/11/2008  . LEG CRAMPS 01/12/2009  . FASTING HYPERGLYCEMIA 01/12/2007  . SYNCOPE, HX OF 02/10/2007  . History of varicella   . Arthritis   . 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 Normal stress echocardiogram 2012  . History of CT scan of chest   . Chest pain 06/12/2010    Ok stress echo has Gi sx also cw gerd.     Family History  Problem Relation Age of Onset  . Hypertension Mother   . Diabetes Mother   . Hypertension Sister   . Hypertension Brother   . Hyperlipidemia Brother   . Colon cancer Neg Hx   . Pancreatic cancer Neg Hx   . Rectal cancer Neg Hx   . Stomach cancer Neg Hx     Social History   Social History  . Marital Status: Single    Spouse Name: N/A  . Number of Children: N/A  . Years of Education: N/A   Occupational History  . [employer]   Social History Main Topics  .  Smoking status: Never Smoker   . Smokeless tobacco: Never Used  . Alcohol Use: No  . Drug Use: No  . Sexual Activity: Not Asked   Other Topics Concern  . 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     Outpatient Prescriptions Prior to Visit  Medication Sig Dispense Refill  . Ascorbic Acid (VITAMIN C) 500 MG tablet Take 500 mg by mouth daily.      Marland Kitchen aspirin 81 MG tablet Take 81 mg by mouth daily. Reported on 03/07/2015    . DEXILANT 60 MG capsule TAKE ONE CAPSULE BY MOUTH ONCE DAILY 30 capsule 8  . ezetimibe (ZETIA) 10 MG tablet Take 1 tablet (10 mg total) by mouth daily. 30 tablet 5  . hydrOXYzine (ATARAX/VISTARIL) 10 MG tablet Take 1-2 tablets (10-20 mg total) by mouth at bedtime as needed for itching. 30 tablet 1  . losartan-hydrochlorothiazide (HYZAAR) 50-12.5 MG tablet  TAKE ONE TABLET BY MOUTH ONCE DAILY 90 tablet 0  . HYDROcodone-acetaminophen (NORCO) 7.5-325 MG tablet TAKE ONE TO TWO TABLETS BY MOUTH ONCE DAILY AS NEEDED 30 tablet 0  . cholecalciferol (VITAMIN D) 1000 UNIT tablet Take 1,000 Units by mouth daily. Reported on 08/11/2015    . predniSONE (DELTASONE) 10 MG tablet Take pills per day,6,6,6,4,4,4,2,2,2,1,1,1 40 tablet 0   No facility-administered medications prior to visit.     EXAM:  BP 128/80 mmHg  Temp(Src) 98.9 F (37.2 C) (Oral)  Ht 5\' 5"  (1.651 m)  Wt 241 lb (109.317 kg)  BMI 40.10 kg/m2  Body mass index is 40.1 kg/(m^2).  GENERAL: vitals reviewed and listed above, alert, oriented, appears well hydrated and in no acute distress HEENT: atraumatic, conjunctiva  clear, no obvious abnormalities on inspection of external nose and ears CV: HRRR, no clubbing cyanosis or  peripheral edema nl cap refill  Back no midline tenderness points to area of low buttocks  area. Gait  symmetrical knees more problematic affecting gait. Negative SLR ETR's hard to elicit but equal no clonus no atrophy. MS: moves all extremities without noticeable focal  abnormality PSYCH: pleasant and cooperative, no obvious depression or anxiety  ASSESSMENT AND PLAN:  Discussed the following assessment and plan:  Sciatic pain, left - Appears to be short-lived without alarming findings except that this is new. Treat as needed refilled pain medicine today follow-up alarm symptoms or recurrence  Medication management - refill med x1  -Patient advised to return or notify health care team  if symptoms worsen ,persist or new concerns arise.  Patient Instructions  i agree that this is  Probably sciatic  But without alarming sx   At this time.  Avoid prolonged sitting  Heavy lifting  Ok to take an extra pain pill if needed.  But if   persistent or progressive sx  Or fever  Or concerns  get back with [place] for further evaluation.  Sciatica Sciatica is pain, weakness, numbness, or tingling along the path of the sciatic nerve. The nerve starts in the lower back and runs down the back of each leg. The nerve controls the muscles in the lower leg and in the back of the knee, while also providing sensation to the back of the thigh, lower leg, and the sole of your foot. Sciatica is a symptom of another medical condition. For instance, nerve damage or certain conditions, such as a herniated disk or bone spur on the spine, pinch or put pressure on the sciatic nerve. This causes the pain, weakness, or other sensations normally associated with sciatica. Generally, sciatica only affects one side of the body. CAUSES   Herniated or slipped disc.  Degenerative disk disease.  A pain disorder involving the narrow muscle in the buttocks (piriformis syndrome).  Pelvic injury or fracture.  Pregnancy.  Tumor (rare). SYMPTOMS  Symptoms can vary from mild to very severe. The symptoms usually travel from the low  back to the buttocks and down the back of the leg. Symptoms can include:  Mild tingling or dull aches in the lower back, leg, or hip.  Numbness in the back of the calf or sole of the foot.  Burning sensations in the lower back, leg, or hip.  Sharp pains in the lower back, leg, or hip.  Leg weakness.  Severe back pain inhibiting movement. These symptoms may get worse with coughing, sneezing, laughing, or prolonged sitting or standing. Also, being overweight may worsen symptoms. DIAGNOSIS  Your caregiver will  perform a physical exam to look for common symptoms of sciatica. He or she may ask you to do certain movements or activities that would trigger sciatic nerve pain. Other tests may be performed to find the cause of the sciatica. These may include:  Blood tests.  X-rays.  Imaging tests, such as an MRI or CT scan. TREATMENT  Treatment is directed at the cause of the sciatic pain. Sometimes, treatment is not necessary and the pain and discomfort goes away on its own. If treatment is needed, your caregiver may suggest:  Over-the-counter medicines to relieve pain.  Prescription medicines, such as anti-inflammatory medicine, muscle relaxants, or narcotics.  Applying heat or ice to the painful area.  Steroid injections to lessen pain, irritation, and inflammation around the nerve.  Reducing activity during periods of pain.  Exercising and stretching to strengthen your abdomen and improve flexibility of your spine. Your caregiver may suggest losing weight if the extra weight makes the back pain worse.  Physical therapy.  Surgery to eliminate what is pressing or pinching the nerve, such as a bone spur or part of a herniated disk. HOME CARE INSTRUCTIONS   Only take over-the-counter or prescription medicines for pain or discomfort as directed by your caregiver.  Apply ice to the affected area for 20 minutes, 3-4 times a day for the first 48-72 hours. Then try heat in the same  way.  Exercise, stretch, or perform your usual activities if these do not aggravate your pain.  Attend physical therapy sessions as directed by your caregiver.  Keep all follow-up appointments as directed by your caregiver.  Do not wear high heels or shoes that do not provide proper support.  Check your mattress to see if it is too soft. A firm mattress may lessen your pain and discomfort. SEEK IMMEDIATE MEDICAL CARE IF:   You lose control of your bowel or bladder (incontinence).  You have increasing weakness in the lower back, pelvis, buttocks, or legs.  You have redness or swelling of your back.  You have a burning sensation when you urinate.  You have pain that gets worse when you lie down or awakens you at night.  Your pain is worse than you have experienced in the past.  Your pain is lasting longer than 4 weeks.  You are suddenly losing weight without reason. MAKE SURE YOU:  Understand these instructions.  Will watch your condition.  Will get help right away if you are not doing well or get worse.   This information is not intended to replace advice given to you by your health care provider. Make sure you discuss any questions you have with your health care provider.   Document Released: 02/05/2001 Document Revised: 11/02/2014 Document Reviewed: 06/23/2011 Elsevier Interactive Patient Education 2016 Aneth K. Patrina Andreas M.D.

## 2015-08-11 ENCOUNTER — Ambulatory Visit (INDEPENDENT_AMBULATORY_CARE_PROVIDER_SITE_OTHER): Payer: Medicare Other | Admitting: Internal Medicine

## 2015-08-11 ENCOUNTER — Encounter: Payer: Self-pay | Admitting: Internal Medicine

## 2015-08-11 VITALS — BP 128/80 | Temp 98.9°F | Ht 65.0 in | Wt 241.0 lb

## 2015-08-11 DIAGNOSIS — M5432 Sciatica, left side: Secondary | ICD-10-CM

## 2015-08-11 DIAGNOSIS — Z79899 Other long term (current) drug therapy: Secondary | ICD-10-CM

## 2015-08-11 MED ORDER — HYDROCODONE-ACETAMINOPHEN 7.5-325 MG PO TABS: ORAL_TABLET | ORAL | Status: DC

## 2015-08-11 NOTE — Patient Instructions (Signed)
i agree that this is  Probably sciatic  But without alarming sx   At this time.  Avoid prolonged sitting  Heavy lifting  Ok to take an extra pain pill if needed.  But if   persistent or progressive sx  Or fever  Or concerns  get back with [place] for further evaluation.  Sciatica Sciatica is pain, weakness, numbness, or tingling along the path of the sciatic nerve. The nerve starts in the lower back and runs down the back of each leg. The nerve controls the muscles in the lower leg and in the back of the knee, while also providing sensation to the back of the thigh, lower leg, and the sole of your foot. Sciatica is a symptom of another medical condition. For instance, nerve damage or certain conditions, such as a herniated disk or bone spur on the spine, pinch or put pressure on the sciatic nerve. This causes the pain, weakness, or other sensations normally associated with sciatica. Generally, sciatica only affects one side of the body. CAUSES   Herniated or slipped disc.  Degenerative disk disease.  A pain disorder involving the narrow muscle in the buttocks (piriformis syndrome).  Pelvic injury or fracture.  Pregnancy.  Tumor (rare). SYMPTOMS  Symptoms can vary from mild to very severe. The symptoms usually travel from the low back to the buttocks and down the back of the leg. Symptoms can include:  Mild tingling or dull aches in the lower back, leg, or hip.  Numbness in the back of the calf or sole of the foot.  Burning sensations in the lower back, leg, or hip.  Sharp pains in the lower back, leg, or hip.  Leg weakness.  Severe back pain inhibiting movement. These symptoms may get worse with coughing, sneezing, laughing, or prolonged sitting or standing. Also, being overweight may worsen symptoms. DIAGNOSIS  Your caregiver will perform a physical exam to look for common symptoms of sciatica. He or she may ask you to do certain movements or activities that would trigger sciatic  nerve pain. Other tests may be performed to find the cause of the sciatica. These may include:  Blood tests.  X-rays.  Imaging tests, such as an MRI or CT scan. TREATMENT  Treatment is directed at the cause of the sciatic pain. Sometimes, treatment is not necessary and the pain and discomfort goes away on its own. If treatment is needed, your caregiver may suggest:  Over-the-counter medicines to relieve pain.  Prescription medicines, such as anti-inflammatory medicine, muscle relaxants, or narcotics.  Applying heat or ice to the painful area.  Steroid injections to lessen pain, irritation, and inflammation around the nerve.  Reducing activity during periods of pain.  Exercising and stretching to strengthen your abdomen and improve flexibility of your spine. Your caregiver may suggest losing weight if the extra weight makes the back pain worse.  Physical therapy.  Surgery to eliminate what is pressing or pinching the nerve, such as a bone spur or part of a herniated disk. HOME CARE INSTRUCTIONS   Only take over-the-counter or prescription medicines for pain or discomfort as directed by your caregiver.  Apply ice to the affected area for 20 minutes, 3-4 times a day for the first 48-72 hours. Then try heat in the same way.  Exercise, stretch, or perform your usual activities if these do not aggravate your pain.  Attend physical therapy sessions as directed by your caregiver.  Keep all follow-up appointments as directed by your caregiver.  Do  not wear high heels or shoes that do not provide proper support.  Check your mattress to see if it is too soft. A firm mattress may lessen your pain and discomfort. SEEK IMMEDIATE MEDICAL CARE IF:   You lose control of your bowel or bladder (incontinence).  You have increasing weakness in the lower back, pelvis, buttocks, or legs.  You have redness or swelling of your back.  You have a burning sensation when you urinate.  You have  pain that gets worse when you lie down or awakens you at night.  Your pain is worse than you have experienced in the past.  Your pain is lasting longer than 4 weeks.  You are suddenly losing weight without reason. MAKE SURE YOU:  Understand these instructions.  Will watch your condition.  Will get help right away if you are not doing well or get worse.   This information is not intended to replace advice given to you by your health care provider. Make sure you discuss any questions you have with your health care provider.   Document Released: 02/05/2001 Document Revised: 11/02/2014 Document Reviewed: 06/23/2011 Elsevier Interactive Patient Education [employer].

## 2015-09-04 ENCOUNTER — Telehealth: Payer: Self-pay | Admitting: Internal Medicine

## 2015-09-04 MED ORDER — DEXLANSOPRAZOLE 60 MG PO CPDR: 1.0000 | DELAYED_RELEASE_CAPSULE | Freq: Every day | ORAL | Status: DC

## 2015-09-04 NOTE — Telephone Encounter (Signed)
Sent to the pharmacy by e-scribe.  Pt has upcoming cpx on 12/25/15

## 2015-09-04 NOTE — Telephone Encounter (Signed)
Pt request refill  DEXILANT 60 MG capsule  [facility]

## 2015-09-08 ENCOUNTER — Other Ambulatory Visit: Payer: Self-pay | Admitting: Internal Medicine

## 2015-09-08 DIAGNOSIS — Z1231 Encounter for screening mammogram for malignant neoplasm of breast: Secondary | ICD-10-CM

## 2015-09-20 ENCOUNTER — Other Ambulatory Visit: Payer: Self-pay | Admitting: Internal Medicine

## 2015-09-21 ENCOUNTER — Ambulatory Visit
Admission: RE | Admit: 2015-09-21 | Discharge: 2015-09-21 | Disposition: A | Discharge status: Home / Self Care | Payer: Medicare Other | Source: Ambulatory Visit | Attending: Internal Medicine | Admitting: Internal Medicine

## 2015-09-21 DIAGNOSIS — Z1231 Encounter for screening mammogram for malignant neoplasm of breast: Secondary | ICD-10-CM

## 2015-09-22 ENCOUNTER — Other Ambulatory Visit: Payer: Self-pay | Admitting: Internal Medicine

## 2015-09-22 DIAGNOSIS — R928 Other abnormal and inconclusive findings on diagnostic imaging of breast: Secondary | ICD-10-CM

## 2015-09-22 NOTE — Telephone Encounter (Signed)
Sent to the pharmacy by e-scribe.  Pt has upcoming appt on 12/26/15

## 2015-09-25 ENCOUNTER — Telehealth: Payer: Self-pay | Admitting: Internal Medicine

## 2015-09-25 ENCOUNTER — Other Ambulatory Visit: Payer: Self-pay | Admitting: Internal Medicine

## 2015-09-25 NOTE — Telephone Encounter (Signed)
losartan-hydrochlorothiazide (HYZAAR) 50-12.5 MG tablet   ezetimibe (ZETIA) 10 MG tablet  [place], [place]. [phone] (Phone) [phone] (Fax)    She would rather for her medication to be refilled at [facility]. There has been some Rx being sent to [facility] and [facility]. The friendly one was used when the [facility] was out of one of her medications.

## 2015-09-26 MED ORDER — EZETIMIBE 10 MG PO TABS: 10.0000 mg | ORAL_TABLET | Freq: Every day | ORAL | 0 refills | Status: DC

## 2015-09-26 MED ORDER — LOSARTAN POTASSIUM-HCTZ 50-12.5 MG PO TABS: 1.0000 | ORAL_TABLET | Freq: Every day | ORAL | 0 refills | Status: DC

## 2015-09-26 NOTE — Telephone Encounter (Signed)
Sent to the pharmacy by e-scribe. 

## 2015-09-27 ENCOUNTER — Other Ambulatory Visit: Payer: Self-pay | Admitting: Internal Medicine

## 2015-09-28 NOTE — Telephone Encounter (Signed)
Refill

## 2015-09-29 NOTE — Telephone Encounter (Signed)
Denied.  Sent to a different [facility] for fill on 09/26/15

## 2015-10-12 ENCOUNTER — Telehealth: Payer: Self-pay | Admitting: Internal Medicine

## 2015-10-12 NOTE — Telephone Encounter (Signed)
°  Pt req refill    HYDROcodone-acetaminophen (NORCO) 7.5-325 MG tablet

## 2015-10-13 MED ORDER — HYDROCODONE-ACETAMINOPHEN 7.5-325 MG PO TABS: ORAL_TABLET | ORAL | 0 refills | Status: DC

## 2015-10-13 NOTE — Telephone Encounter (Signed)
Left a message for the pt to return my call.  Prescription is ready.  Pt will need to pick up at the front desk.

## 2015-10-13 NOTE — Telephone Encounter (Signed)
Pt aware rx ready for pick up. Will try to get here Monday.

## 2015-10-13 NOTE — Telephone Encounter (Signed)
Ok to refill x 1  Due for tox screen ?  Indication for chronic opioid use:  djd pain  Medication dose:  HYDROcodone-acetaminophen (NORCO) 7.5-325 MG tablet 08/11/2015   08/11/2015 Discontinue         TAKE ONE TO TWO TABLETS BY MOUTH ONCE DAILY AS NEEDED      #pills per month:  Limited   Prn  30 maximum Last UDS Date: 12 2014 Controlled Substance agreement signed : y/n Date narcotic database last reviewed :  NA [email or web address]

## 2015-10-16 ENCOUNTER — Encounter: Payer: Self-pay | Admitting: Internal Medicine

## 2015-10-16 DIAGNOSIS — Z79891 Long term (current) use of opiate analgesic: Secondary | ICD-10-CM | POA: Diagnosis not present

## 2015-10-27 DIAGNOSIS — H2513 Age-related nuclear cataract, bilateral: Secondary | ICD-10-CM | POA: Diagnosis not present

## 2015-12-20 ENCOUNTER — Other Ambulatory Visit (INDEPENDENT_AMBULATORY_CARE_PROVIDER_SITE_OTHER): Payer: Medicare Other

## 2015-12-20 DIAGNOSIS — Z0000 Encounter for general adult medical examination without abnormal findings: Secondary | ICD-10-CM

## 2015-12-20 DIAGNOSIS — R7989 Other specified abnormal findings of blood chemistry: Secondary | ICD-10-CM

## 2015-12-20 LAB — BASIC METABOLIC PANEL
BUN: 12 mg/dL (ref 6–23)
CHLORIDE: 101 meq/L (ref 96–112)
CO2: 27 mEq/L (ref 19–32)
CREATININE: 1.25 mg/dL — AB (ref 0.40–1.20)
Calcium: 9.3 mg/dL (ref 8.4–10.5)
GFR: 54.21 mL/min — AB (ref >60.00)
Glucose, Bld: 149 mg/dL — ABNORMAL HIGH (ref 70–99)
POTASSIUM: 3.4 meq/L — AB (ref 3.5–5.1)
Sodium: 138 mEq/L (ref 135–145)

## 2015-12-20 LAB — CBC WITH DIFFERENTIAL/PLATELET
Basophils Absolute: 0 10*3/uL (ref 0.0–0.1)
Basophils Relative: 0.3 % (ref 0.0–3.0)
EOS PCT: 2 % (ref 0.0–5.0)
Eosinophils Absolute: 0.1 10*3/uL (ref 0.0–0.7)
HCT: 38.8 % (ref 36.0–46.0)
HEMOGLOBIN: 12.7 g/dL (ref 12.0–15.0)
LYMPHS ABS: 1.6 10*3/uL (ref 0.7–4.0)
Lymphocytes Relative: 34.2 % (ref 12.0–46.0)
MCHC: 32.8 g/dL (ref 30.0–36.0)
MCV: 81.5 fl (ref 78.0–100.0)
MONOS PCT: 12.8 % — AB (ref 3.0–12.0)
Monocytes Absolute: 0.6 10*3/uL (ref 0.1–1.0)
Neutro Abs: 2.4 10*3/uL (ref 1.4–7.7)
Neutrophils Relative %: 50.7 % (ref 43.0–77.0)
Platelets: 185 10*3/uL (ref 150.0–400.0)
RBC: 4.75 Mil/uL (ref 3.87–5.11)
RDW: 15.8 % — ABNORMAL HIGH (ref 11.5–15.5)
WBC: 4.7 10*3/uL (ref 4.0–10.5)

## 2015-12-20 LAB — HEPATIC FUNCTION PANEL
ALBUMIN: 4.5 g/dL (ref 3.5–5.2)
ALT: 24 U/L (ref 0–35)
AST: 20 U/L (ref 0–37)
Alkaline Phosphatase: 74 U/L (ref 39–117)
Bilirubin, Direct: 0.1 mg/dL (ref 0.0–0.3)
Total Bilirubin: 0.6 mg/dL (ref 0.2–1.2)
Total Protein: 6.8 g/dL (ref 6.0–8.3)

## 2015-12-20 LAB — LIPID PANEL
CHOLESTEROL: 258 mg/dL — AB (ref 0–200)
HDL: 62 mg/dL (ref >39.00)
NonHDL: 195.71
Total CHOL/HDL Ratio: 4
Triglycerides: 230 mg/dL — ABNORMAL HIGH (ref 0.0–149.0)
VLDL: 46 mg/dL — ABNORMAL HIGH (ref 0.0–40.0)

## 2015-12-20 LAB — LDL CHOLESTEROL, DIRECT: LDL DIRECT: 171 mg/dL

## 2015-12-20 LAB — TSH: TSH: 1.35 u[IU]/mL (ref 0.35–4.50)

## 2015-12-22 NOTE — Progress Notes (Signed)
Pre visit review using our clinic review tool, if applicable. No additional management support is needed unless otherwise documented below in the visit note.  Chief Complaint  Patient presents with  . Medicare Wellness    HPI: MARYLINDA BORTNICK 70 y.o. comes in today for Preventive Medicare wellness visit .Since last visit. No major changes   Surgery   Bp usually at goal at home 130/80 range  Missed bp med today ran out .   Pain pill ocass  kness but sometimes other areas  Hurting. Has upper arm a arm aches not part shoulder    And hand stiffness sometimes in am . Better as day goes on.  Eating more IC lately ocass sugar beverages .   Taking zetia  cananot tolerate statin  Called her back mammo  Uncertain if she needs to go    Health Maintenance  Topic Date Due  . Hepatitis C Screening  03/06/2016 (Originally 1945/12/07)  . MAMMOGRAM  09/20/2017  . COLONOSCOPY  09/22/2018  . TETANUS/TDAP  10/17/2019  . INFLUENZA VACCINE  Completed  . DEXA SCAN  Completed  . ZOSTAVAX  Completed  . PNA vac Low Risk Adult  Completed   Health Maintenance Review LIFESTYLE:  TADno  Walks exercise activity some limitation for joint knee pain Sugar beverages: once a day or less.  Sleep: at least 3-4   Uninterrupted .  Works 2- 8 hour  1  4hours watching a 70 YO lady  MEDICARE DOCUMENT QUESTIONS  TO SCAN    Hearing: ok  Vision:  No limitations at present . Last eye check UTD 9 17 no glaucoma  Safety:  Has smoke detector and wears seat belts.  No firearms. No excess sun exposure. Sees dentist regularly.  Falls: n  Advance directive :  Reviewed  Still doesn't have one yet  Memory: Felt to be good  , no concern from her or her family.  Depression: No anhedonia unusual crying or depressive symptoms  Nutrition: Eats well balanced diet; adequate calcium and vitamin D. No swallowing chewing problems.  Injury: no major injuries in the last six months.  Other healthcare providers:  Reviewed  today .  Social:  Lives alone . No pets.   Preventive parameters: up-to-date  Reviewed   ADLS:   There are no problems or need for assistance  driving, feeding, obtaining food, dressing, toileting and bathing, managing money using phone. She is independent.see form     ROS:   MS sx see above  GEN/ HEENT: No fever, significant weight changes sweats headaches vision problems hearing changes, CV/ PULM; No chest pain shortness of breath cough, syncope,edema  change in exercise tolerance. GI /GU: No adominal pain, vomiting, change in bowel habits. No blood in the stool. No significant GU symptoms. SKIN/HEME: ,no acute skin rashes suspicious lesions or bleeding. No lymphadenopathy, nodules, masses.  NEURO/ PSYCH:  No neurologic signs such as weakness numbness. No depression anxiety. IMM/ Allergy: No unusual infections.  Allergy .   REST of 12 system review negative except as per HPI   Past Medical History:  Diagnosis Date  . ANXIETY 11/11/2006  . Arthritis   . BACK PAIN, LUMBAR 07/11/2008  . Chest pain 06/12/2010   Ok stress echo has Gi sx also cw gerd.   Marland Kitchen FASTING HYPERGLYCEMIA 01/12/2007  . History of CT scan of chest   . History of varicella   . HOT FLASHES 04/07/2008  . HYPERLIPIDEMIA 01/12/2007  . HYPERTENSION 11/11/2006  . LEG CRAMPS  01/12/2009  . Normal stress echocardiogram 2012  . 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 SYNCOPE, HX OF 02/10/2007  . UNSPECIFIED ARTHROPATHY SITE UNSPECIFIED 01/12/2007  . Unspecified vitamin D deficiency 02/10/2007    Family History  Problem Relation Age of Onset  . Hypertension Mother   . Diabetes Mother   . Hypertension Sister   . Hypertension Brother   . Hyperlipidemia Brother   . Colon cancer Neg Hx   . Pancreatic cancer Neg Hx   . Rectal cancer Neg Hx   . Stomach cancer Neg Hx     Social History   Social History  . Marital status: Single    Spouse name: N/A  . Number of children: N/A  . Years of  education: N/A   Occupational History  . [employer]   Social History Main Topics  . Smoking status: Never Smoker  . Smokeless tobacco: Never Used  . Alcohol use No  . Drug use: No  . Sexual activity: Not Asked   Other Topics Concern  . 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     Outpatient Encounter Prescriptions as of 12/25/2015  Medication Sig  . Ascorbic Acid (VITAMIN C) 500 MG tablet Take 500 mg by mouth daily.    . COD LIVER OIL PO Take by mouth.  . dexlansoprazole (DEXILANT) 60 MG capsule Take 1 capsule (60 mg total) by mouth daily.  Marland Kitchen ezetimibe (ZETIA) 10 MG tablet Take 1 tablet (10 mg total) by mouth daily.  Marland Kitchen HYDROcodone-acetaminophen (NORCO) 7.5-325 MG tablet TAKE ONE TO TWO TABLETS BY MOUTH ONCE DAILY AS NEEDED  . losartan-hydrochlorothiazide (HYZAAR) 50-12.5 MG tablet Take 1 tablet by mouth daily.  . [DISCONTINUED] ezetimibe (ZETIA) 10 MG tablet Take 1 tablet (10 mg total) by mouth daily.  . [DISCONTINUED] losartan-hydrochlorothiazide (HYZAAR) 50-12.5 MG tablet Take 1 tablet by mouth daily.  Marland Kitchen aspirin 81 MG tablet Take 81 mg by mouth daily. Reported on 03/07/2015  . hydrOXYzine (ATARAX/VISTARIL) 10 MG tablet Take 1-2 tablets (10-20 mg total) by mouth at bedtime as needed for itching. (Patient not taking: Reported on 12/25/2015)  . [DISCONTINUED] cholecalciferol (VITAMIN D) 1000 UNIT tablet Take 1,000 Units by mouth daily. Reported on 08/11/2015   No facility-administered encounter medications on file as of 12/25/2015.     EXAM:  BP (!) 146/86 (BP Location: Right Arm, Cuff Size: Large)   Temp 98.3 F (36.8 C) (Oral)   Ht 5' 5.25" (1.657 m)   Wt 237 lb 3.2 oz (107.6 kg)   BMI 39.17 kg/m   Body mass index is 39.17 kg/m.  Physical Exam: Vital signs  reviewed [id number] is a well-developed well-nourished alert cooperative   who appears stated age in no acute distress.  HEENT: normocephalic atraumatic , Eyes: PERRL EOM's full, conjunctiva clear, Nares: paten,t no deformity discharge or tenderness., Ears: no deformity EAC's clear TMs with normal landmarks. Mouth: clear OP, no lesions, edema.  Moist mucous membranes. Dentition in adequate repair. NECK: supple without masses, thyromegaly or bruits. CHEST/PULM:  Clear to auscultation and percussion breath sounds equal no wheeze , rales or rhonchi. No chest wall deformities or tenderness.Breast: normal by inspection . No dimpling, discharge, masses,  tenderness or discharge . CV: PMI is nondisplaced, S1 S2 no gallops, murmurs, rubs. Peripheral pulses are fpresent without delay.No JVD .  ABDOMEN: Bowel sounds normal nontender  No guard or rebound, no hepato splenomegal no CVA tenderness.   Extremtities:  No clubbing cyanosis or edema, no acute joint swelling or redness no focal atrophy hands no atrophy or redness  NEURO:  Oriented x3, cranial nerves 3-12 appear to be intact, no obvious focal weakness,gait within normal limits dtrs present feet no ulcer or acute deformity or neuropathy  SKIN: No acute rashes normal turgor, color, no bruising or petechiae. PSYCH: Oriented, good eye contact, no obvious depression anxiety, cognition and judgment appear normal. LN: no cervical axillary inguinal adenopathy No noted deficits in memory, attention, and speech.   Lab Results  Component Value Date   WBC 4.7 12/20/2015   HGB 12.7 12/20/2015   HCT 38.8 12/20/2015   PLT 185.0 12/20/2015   GLUCOSE 149 (H) 12/20/2015   CHOL 258 (H) 12/20/2015   TRIG 230.0 (H) 12/20/2015   HDL 62.00 12/20/2015   LDLDIRECT 171.0 12/20/2015   LDLCALC 134 (H) 06/02/2015   ALT 24 12/20/2015   AST 20 12/20/2015   NA 138 12/20/2015   K 3.4 (L) 12/20/2015   CL 101 12/20/2015   CREATININE 1.25 (H) 12/20/2015   BUN 12 12/20/2015    CO2 27 12/20/2015   TSH 1.35 12/20/2015   HGBA1C 5.7 12/25/2015    ASSESSMENT AND PLAN:  Discussed the following assessment and plan:  Visit for preventive health examination - ho on  advanced directive today   Medicare annual wellness visit, subsequent  Medication management  Essential hypertension - reading up today out of med  Mixed hyperlipidemia  Need for prophylactic vaccination and inoculation against influenza - Plan: Flu vaccine HIGH DOSE PF (Fluzone High dose)  Hyperglycemia - Plan: POCT A1C  Muscular aches ? - ? if more thanOA upper arm sx  knees  am stiffness  rheum consult  ? djd or other dx also   Musculoskeletal symptom - Plan: Ambulatory referral to Rheumatology  Body aches - Plan: Ambulatory referral to Rheumatology  Hypokalemia - borderline  diet change bmp magne in a month   Arthritis - Plan: Ambulatory referral to Rheumatology  Renal insufficiency - cr 1.25 today  stable  Reviewed mammogram and July needs follow-up views right breast distortion . Reviewed with patient Some of her achiness appears atypical for degenerative osteoarthritis the lane. Arthritis runs in the family but uncertain if autoimmune. Arrange rheumatologic consult   Okay to continue on as needed limited pain medicine. Will follow. Attend 2 lifestyle eating lipids and blood sugar.  Lab one month BMP MAG no ov Lab and ov in 6 months LIPID A1C BMP pre OV Patient Care Team: Burnis Medin, MD as PCP - General Gatha Mayer, MD (Gastroenterology) Addison Lank, MD as Referring Physician (Dermatology) Josue Hector, MD (Cardiology)  Patient Instructions  Your cholesterol and blood sugar is not as good as it was in the past 6 months. Stop all beverages that have sugar in them such as juices sodas Gatorade sweets tea. Water and low-fat milk is okay. Fruit is better than juice. The next 2 months. The ice cream. If he can lose 3-4 pounds over the next 2 months this may also help your  triglycerides blood sugar. You do not have diabetes but are at risk for this.  Suggest advance care directive or HCPOA  Sign.  Make sure your  bp readings are below  140/90  Your potassium is slightly low  Increase potassium in diet taht doen increase sugars or we may need to add a pill of potassium      Mariann Laster K. Panosh M.D.

## 2015-12-25 ENCOUNTER — Encounter: Payer: Self-pay | Admitting: Internal Medicine

## 2015-12-25 ENCOUNTER — Ambulatory Visit (INDEPENDENT_AMBULATORY_CARE_PROVIDER_SITE_OTHER): Payer: Medicare Other | Admitting: Internal Medicine

## 2015-12-25 VITALS — BP 146/86 | Temp 98.3°F | Ht 65.25 in | Wt 237.2 lb

## 2015-12-25 DIAGNOSIS — Z23 Encounter for immunization: Secondary | ICD-10-CM

## 2015-12-25 DIAGNOSIS — R52 Pain, unspecified: Secondary | ICD-10-CM

## 2015-12-25 DIAGNOSIS — M791 Myalgia, unspecified site: Secondary | ICD-10-CM

## 2015-12-25 DIAGNOSIS — Z79899 Other long term (current) drug therapy: Secondary | ICD-10-CM | POA: Diagnosis not present

## 2015-12-25 DIAGNOSIS — Z0000 Encounter for general adult medical examination without abnormal findings: Secondary | ICD-10-CM

## 2015-12-25 DIAGNOSIS — E876 Hypokalemia: Secondary | ICD-10-CM

## 2015-12-25 DIAGNOSIS — E782 Mixed hyperlipidemia: Secondary | ICD-10-CM | POA: Diagnosis not present

## 2015-12-25 DIAGNOSIS — R739 Hyperglycemia, unspecified: Secondary | ICD-10-CM | POA: Diagnosis not present

## 2015-12-25 DIAGNOSIS — N289 Disorder of kidney and ureter, unspecified: Secondary | ICD-10-CM

## 2015-12-25 DIAGNOSIS — I10 Essential (primary) hypertension: Secondary | ICD-10-CM

## 2015-12-25 DIAGNOSIS — R2991 Unspecified symptoms and signs involving the musculoskeletal system: Secondary | ICD-10-CM

## 2015-12-25 DIAGNOSIS — M1990 Unspecified osteoarthritis, unspecified site: Secondary | ICD-10-CM

## 2015-12-25 LAB — POCT GLYCOSYLATED HEMOGLOBIN (HGB A1C): HEMOGLOBIN A1C: 5.7

## 2015-12-25 MED ORDER — LOSARTAN POTASSIUM-HCTZ 50-12.5 MG PO TABS: 1.0000 | ORAL_TABLET | Freq: Every day | ORAL | 2 refills | Status: DC

## 2015-12-25 MED ORDER — EZETIMIBE 10 MG PO TABS: 10.0000 mg | ORAL_TABLET | Freq: Every day | ORAL | 3 refills | Status: DC

## 2015-12-25 NOTE — Patient Instructions (Addendum)
Your cholesterol and blood sugar is not as good as it was in the past 6 months. Stop all beverages that have sugar in them such as juices sodas Gatorade sweets tea. Water and low-fat milk is okay. Fruit is better than juice. The next 2 months. The ice cream. If he can lose 3-4 pounds over the next 2 months this may also help your triglycerides blood sugar. You do not have diabetes but are at risk for this.  Suggest advance care directive or HCPOA  Sign.  Make sure your  bp readings are below  140/90  Your potassium is slightly low  Increase potassium in diet taht doen increase sugars or we may need to add a pill of potassium

## 2016-01-07 ENCOUNTER — Other Ambulatory Visit: Payer: Self-pay | Admitting: Internal Medicine

## 2016-01-09 NOTE — Telephone Encounter (Signed)
Sent to the pharmacy by e-scribe for 6 months.  Pt seen for cpx on 12/25/15.

## 2016-01-10 DIAGNOSIS — M179 Osteoarthritis of knee, unspecified: Secondary | ICD-10-CM | POA: Diagnosis not present

## 2016-01-10 DIAGNOSIS — M2550 Pain in unspecified joint: Secondary | ICD-10-CM | POA: Diagnosis not present

## 2016-01-10 DIAGNOSIS — M0570 Rheumatoid arthritis with rheumatoid factor of unspecified site without organ or systems involvement: Secondary | ICD-10-CM | POA: Diagnosis not present

## 2016-01-23 ENCOUNTER — Telehealth: Payer: Self-pay | Admitting: Internal Medicine

## 2016-01-23 NOTE — Telephone Encounter (Signed)
° ° °  Pt request refill of the following: ° °HYDROcodone-acetaminophen (NORCO) 7.5-325 MG tablet ° ° °Phamacy: °

## 2016-01-24 ENCOUNTER — Other Ambulatory Visit: Payer: Self-pay | Admitting: Family Medicine

## 2016-01-24 DIAGNOSIS — E876 Hypokalemia: Secondary | ICD-10-CM

## 2016-01-25 ENCOUNTER — Other Ambulatory Visit (INDEPENDENT_AMBULATORY_CARE_PROVIDER_SITE_OTHER): Payer: Medicare Other

## 2016-01-25 DIAGNOSIS — E876 Hypokalemia: Secondary | ICD-10-CM

## 2016-01-25 LAB — BASIC METABOLIC PANEL
BUN: 13 mg/dL (ref 6–23)
CO2: 28 mEq/L (ref 19–32)
Calcium: 9.5 mg/dL (ref 8.4–10.5)
Chloride: 104 mEq/L (ref 96–112)
Creatinine, Ser: 1.22 mg/dL — ABNORMAL HIGH (ref 0.40–1.20)
GFR: 55.74 mL/min — AB (ref >60.00)
Glucose, Bld: 101 mg/dL — ABNORMAL HIGH (ref 70–99)
POTASSIUM: 4.7 meq/L (ref 3.5–5.1)
SODIUM: 140 meq/L (ref 135–145)

## 2016-01-25 LAB — MAGNESIUM: MAGNESIUM: 1.9 mg/dL (ref 1.5–2.5)

## 2016-01-25 NOTE — Telephone Encounter (Signed)
Ok to refill x 1  

## 2016-01-26 MED ORDER — HYDROCODONE-ACETAMINOPHEN 7.5-325 MG PO TABS: ORAL_TABLET | ORAL | 0 refills | Status: DC

## 2016-01-26 NOTE — Telephone Encounter (Signed)
Pt notified to pick up at the front desk. 

## 2016-02-28 DIAGNOSIS — M2550 Pain in unspecified joint: Secondary | ICD-10-CM | POA: Diagnosis not present

## 2016-02-28 DIAGNOSIS — R768 Other specified abnormal immunological findings in serum: Secondary | ICD-10-CM | POA: Diagnosis not present

## 2016-02-28 DIAGNOSIS — M1990 Unspecified osteoarthritis, unspecified site: Secondary | ICD-10-CM | POA: Diagnosis not present

## 2016-02-28 DIAGNOSIS — M109 Gout, unspecified: Secondary | ICD-10-CM | POA: Diagnosis not present

## 2016-02-28 LAB — BASIC METABOLIC PANEL
BUN: 10 mg/dL (ref 4–21)
CREATININE: 1.2 mg/dL — AB (ref <=1.1)
GLUCOSE: 92 mg/dL
POTASSIUM: 4.3 mmol/L (ref 3.4–5.3)
SODIUM: 139 mmol/L (ref 137–147)

## 2016-02-28 LAB — CBC AND DIFFERENTIAL
HCT: 40 % (ref 36–46)
Hemoglobin: 13 g/dL (ref 12.0–16.0)
Platelets: 198 10*3/uL (ref 150–399)
WBC: 5.2 10^3/mL

## 2016-02-28 LAB — HEPATIC FUNCTION PANEL
ALK PHOS: 83 U/L (ref 25–125)
ALT: 22 U/L (ref 7–35)
AST: 15 U/L (ref 13–35)
BILIRUBIN, TOTAL: 0.5 mg/dL

## 2016-03-11 ENCOUNTER — Encounter: Payer: Self-pay | Admitting: Family Medicine

## 2016-06-13 ENCOUNTER — Other Ambulatory Visit (INDEPENDENT_AMBULATORY_CARE_PROVIDER_SITE_OTHER): Payer: Medicare Other

## 2016-06-13 ENCOUNTER — Other Ambulatory Visit: Payer: Self-pay | Admitting: Emergency Medicine

## 2016-06-13 DIAGNOSIS — E876 Hypokalemia: Secondary | ICD-10-CM | POA: Diagnosis not present

## 2016-06-13 LAB — BASIC METABOLIC PANEL
BUN: 10 mg/dL (ref 6–23)
CHLORIDE: 103 meq/L (ref 96–112)
CO2: 29 meq/L (ref 19–32)
Calcium: 9.2 mg/dL (ref 8.4–10.5)
Creatinine, Ser: 1.17 mg/dL (ref 0.40–1.20)
GFR: 58.43 mL/min — ABNORMAL LOW (ref >60.00)
Glucose, Bld: 103 mg/dL — ABNORMAL HIGH (ref 70–99)
POTASSIUM: 3.8 meq/L (ref 3.5–5.1)
SODIUM: 139 meq/L (ref 135–145)

## 2016-06-13 LAB — LIPID PANEL
CHOL/HDL RATIO: 4
CHOLESTEROL: 231 mg/dL — AB (ref 0–200)
HDL: 60.9 mg/dL (ref >39.00)
LDL CALC: 143 mg/dL — AB (ref 0–99)
NonHDL: 170.34
Triglycerides: 136 mg/dL (ref 0.0–149.0)
VLDL: 27.2 mg/dL (ref 0.0–40.0)

## 2016-06-13 LAB — HEMOGLOBIN A1C: Hgb A1c MFr Bld: 6.3 % (ref 4.6–6.5)

## 2016-06-19 NOTE — Progress Notes (Signed)
Chief Complaint  Patient presents with  . Follow-up    6 month f/u     HPI: Cynthia Estrada 71 y.o. come in for Chronic disease management  Blood pressure no change seems to be okay Hyperlipidemia taking Nigel Sloop seems to feel better on this medicine without the myalgias of the statin. Pain management only takes the hydrocodone as needed for severe pain in her joint. Still has about 10 left given 30 in December. Has never tried tramadol for this.  Did see the rheumatologist and they told her she had gout and gave her prednisone. She seems to be better from this. They put her on allopurinol. Exposed to follow-up in 6 months. They noted that she did have an elevated rheumatoid factor.  Has soreness red area on right lower extremity for about the last 3 days without associated fever or trauma. She does put her foot up.  ROS: See pertinent positives and negatives per HPI. No fever chest pain shortness of breath.  Past Medical History:  Diagnosis Date  . ANXIETY 11/11/2006  . Arthritis   . BACK PAIN, LUMBAR 07/11/2008  . Chest pain 06/12/2010   Ok stress echo has Gi sx also cw gerd.   Marland Kitchen FASTING HYPERGLYCEMIA 01/12/2007  . History of CT scan of chest   . History of varicella   . HOT FLASHES 04/07/2008  . HYPERLIPIDEMIA 01/12/2007  . HYPERTENSION 11/11/2006  . LEG CRAMPS 01/12/2009  . Normal stress echocardiogram 2012  . 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 SYNCOPE, HX OF 02/10/2007  . UNSPECIFIED ARTHROPATHY SITE UNSPECIFIED 01/12/2007  . Unspecified vitamin D deficiency 02/10/2007    Family History  Problem Relation Age of Onset  . Hypertension Mother   . Diabetes Mother   . Hypertension Sister   . Hypertension Brother   . Hyperlipidemia Brother   . Colon cancer Neg Hx   . Pancreatic cancer Neg Hx   . Rectal cancer Neg Hx   . Stomach cancer Neg Hx     Social History   Social History  . Marital status: Single    Spouse name: N/A  .  Number of children: N/A  . Years of education: N/A   Occupational History  . [employer]   Social History Main Topics  . Smoking status: Never Smoker  . Smokeless tobacco: Never Used  . Alcohol use No  . Drug use: No  . Sexual activity: Not Asked   Other Topics Concern  . 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     Outpatient Medications Prior to Visit  Medication Sig Dispense Refill  . Ascorbic Acid (VITAMIN C) 500 MG tablet Take 500 mg by mouth daily.      Marland Kitchen aspirin 81 MG tablet Take 81 mg by mouth daily. Reported on 03/07/2015    . COD LIVER OIL PO Take by mouth.    . DEXILANT 60 MG capsule TAKE ONE CAPSULE BY MOUTH ONCE DAILY 90 capsule 1  . ezetimibe (ZETIA) 10 MG tablet Take 1 tablet (10 mg total) by mouth daily. 90 tablet 3  . HYDROcodone-acetaminophen (NORCO) 7.5-325 MG tablet TAKE ONE TO TWO TABLETS BY MOUTH ONCE DAILY  AS NEEDED 30 tablet 0  . hydrOXYzine (ATARAX/VISTARIL) 10 MG tablet Take 1-2 tablets (10-20 mg total) by mouth at bedtime as needed for itching. 30 tablet 1  . losartan-hydrochlorothiazide (HYZAAR) 50-12.5 MG tablet Take 1 tablet by mouth daily. 90 tablet 2   No facility-administered medications prior to visit.      EXAM:  BP 130/80 (BP Location: Right Arm, Patient Position: Sitting, Cuff Size: Large)   Pulse (!) 53   Temp 98.3 F (36.8 C) (Oral)   Ht 5' 5.25" (1.657 m)   Wt 236 lb (107 kg)   BMI 38.97 kg/m   Body mass index is 38.97 kg/m.  GENERAL: vitals reviewed and listed above, alert, oriented, appears well hydrated and in no acute distress HEENT: atraumatic, conjunctiva  clear, no obvious abnormalities on inspection of external nose and ears  NECK: no obvious masses on inspection palpation  LUNGS: clear to auscultation bilaterally, no  wheezes, rales or rhonchi, good air movement CV: HRRR, no clubbing cyanosis or  peripheral edema nl cap refill  MS: moves all extremities  skin on right lower extremity over the shin area there is a to faint HEENT area nodular mildly tender area without a center no pustule no fluctuance noted. Patient thought it might of been a bug bite but doesn't have itching remember this PSYCH: pleasant and cooperative, no obvious depression or anxiety Lab Results  Component Value Date   WBC 5.2 02/28/2016   HGB 13.0 02/28/2016   HCT 40 02/28/2016   PLT 198 02/28/2016   GLUCOSE 103 (H) 06/13/2016   CHOL 231 (H) 06/13/2016   TRIG 136.0 06/13/2016   HDL 60.90 06/13/2016   LDLDIRECT 171.0 12/20/2015   LDLCALC 143 (H) 06/13/2016   ALT 22 02/28/2016   AST 15 02/28/2016   NA 139 06/13/2016   K 3.8 06/13/2016   CL 103 06/13/2016   CREATININE 1.17 06/13/2016   BUN 10 06/13/2016   CO2 29 06/13/2016   TSH 1.35 12/20/2015   HGBA1C 6.3 06/13/2016   BP Readings from Last 3 Encounters:  06/20/16 130/80  12/25/15 (!) 146/86  08/11/15 128/80   Laboratory studies ASSESSMENT AND PLAN:  Discussed the following assessment and plan:  Essential hypertension  Medication management  Hyperglycemia - pre diabetic  Mixed hyperlipidemia - improved given zetia   Skin infection ?  Skin  red - poss earlyu infection vs other   Gout, unspecified cause, unspecified chronicity, unspecified site  Elevated rheumatoid factor Myalgias better off of the statin and on the same. Discussed alternatives to hydrocodone for pain management will see how goes. She uses medicines appropriately. History of gout with a positive rheumatoid factor seen by rheumatology and follow-up. Local area of redness in her right lower extremity uncertain cause could be early infection expectant management can add antibiotic if appropriate follow-up visit if persistent progressive. Blood sugar borderline discussed dietary changes and  strategies she is aware. -Patient advised to return or notify health care team  if  new concerns arise.  Patient Instructions  Continue zetia  fo rcholesterol   Decrease sugars and simple carbs such as chips  sgingerale and  Ice cream .  goal 10 # weight loss    Try tramadol instead of hydrocodone if needed for joint pain .    Warm compresses to leg area and if increasing  In size  Add antibiotic    If fever or severe sx then contact for recheck   Plan  chek up yearly visit in  6 months or as needed .     Standley Brooking. Alanson Hausmann M.D.

## 2016-06-20 ENCOUNTER — Encounter: Payer: Self-pay | Admitting: Internal Medicine

## 2016-06-20 ENCOUNTER — Ambulatory Visit (INDEPENDENT_AMBULATORY_CARE_PROVIDER_SITE_OTHER): Payer: Medicare Other | Admitting: Internal Medicine

## 2016-06-20 VITALS — BP 130/80 | HR 53 | Temp 98.3°F | Ht 65.25 in | Wt 236.0 lb

## 2016-06-20 DIAGNOSIS — I10 Essential (primary) hypertension: Secondary | ICD-10-CM

## 2016-06-20 DIAGNOSIS — E782 Mixed hyperlipidemia: Secondary | ICD-10-CM

## 2016-06-20 DIAGNOSIS — R739 Hyperglycemia, unspecified: Secondary | ICD-10-CM

## 2016-06-20 DIAGNOSIS — R233 Spontaneous ecchymoses: Secondary | ICD-10-CM | POA: Diagnosis not present

## 2016-06-20 DIAGNOSIS — L089 Local infection of the skin and subcutaneous tissue, unspecified: Secondary | ICD-10-CM | POA: Diagnosis not present

## 2016-06-20 DIAGNOSIS — R768 Other specified abnormal immunological findings in serum: Secondary | ICD-10-CM

## 2016-06-20 DIAGNOSIS — Z79899 Other long term (current) drug therapy: Secondary | ICD-10-CM

## 2016-06-20 DIAGNOSIS — M109 Gout, unspecified: Secondary | ICD-10-CM | POA: Diagnosis not present

## 2016-06-20 MED ORDER — CEPHALEXIN 500 MG PO CAPS: 500.0000 mg | ORAL_CAPSULE | Freq: Three times a day (TID) | ORAL | 0 refills | Status: AC

## 2016-06-20 MED ORDER — TRAMADOL HCL 50 MG PO TABS: 50.0000 mg | ORAL_TABLET | Freq: Three times a day (TID) | ORAL | 0 refills | Status: DC | PRN

## 2016-06-20 NOTE — Patient Instructions (Addendum)
Continue zetia  fo rcholesterol   Decrease sugars and simple carbs such as chips  sgingerale and  Ice cream .  goal 10 # weight loss    Try tramadol instead of hydrocodone if needed for joint pain .    Warm compresses to leg area and if increasing  In size  Add antibiotic    If fever or severe sx then contact for recheck   Plan  chek up yearly visit in 6 months or as needed .

## 2016-07-08 ENCOUNTER — Other Ambulatory Visit: Payer: Self-pay | Admitting: Internal Medicine

## 2016-08-27 DIAGNOSIS — R768 Other specified abnormal immunological findings in serum: Secondary | ICD-10-CM | POA: Diagnosis not present

## 2016-08-27 DIAGNOSIS — M1990 Unspecified osteoarthritis, unspecified site: Secondary | ICD-10-CM | POA: Diagnosis not present

## 2016-08-27 DIAGNOSIS — M109 Gout, unspecified: Secondary | ICD-10-CM | POA: Diagnosis not present

## 2016-08-27 DIAGNOSIS — M2550 Pain in unspecified joint: Secondary | ICD-10-CM | POA: Diagnosis not present

## 2016-09-17 ENCOUNTER — Other Ambulatory Visit: Payer: Self-pay | Admitting: Internal Medicine

## 2016-10-23 DIAGNOSIS — E1165 Type 2 diabetes mellitus with hyperglycemia: Secondary | ICD-10-CM | POA: Diagnosis not present

## 2016-10-23 DIAGNOSIS — R768 Other specified abnormal immunological findings in serum: Secondary | ICD-10-CM | POA: Diagnosis not present

## 2016-10-23 DIAGNOSIS — M2550 Pain in unspecified joint: Secondary | ICD-10-CM | POA: Diagnosis not present

## 2016-10-23 DIAGNOSIS — M1990 Unspecified osteoarthritis, unspecified site: Secondary | ICD-10-CM | POA: Diagnosis not present

## 2016-10-23 DIAGNOSIS — M109 Gout, unspecified: Secondary | ICD-10-CM | POA: Diagnosis not present

## 2016-10-23 DIAGNOSIS — E669 Obesity, unspecified: Secondary | ICD-10-CM | POA: Diagnosis not present

## 2016-11-05 ENCOUNTER — Telehealth: Payer: Self-pay | Admitting: Internal Medicine

## 2016-11-05 NOTE — Telephone Encounter (Signed)
Received a PA request for Dexilant. PA submitted & is pending. Key: Felix Pacini

## 2016-11-08 ENCOUNTER — Telehealth: Payer: Self-pay | Admitting: Internal Medicine

## 2016-11-08 NOTE — Telephone Encounter (Signed)
Received PA request for Dexilant. PA submitted & is pending. Key: Cynthia Estrada

## 2016-11-13 NOTE — Telephone Encounter (Signed)
Dexilant 60mg  approved from 11/10/16 through 02/24/17 Pt aware. Nothing further needed.

## 2016-12-18 NOTE — Progress Notes (Signed)
Chief Complaint  Patient presents with  . Follow-up    No new concerns    HPI: Cynthia Estrada 71 y.o. comes in today for Yearly visit and medicine chronic disease management.  But has had a new symptom since Monday 3 days ago.  Musculoskeletal history of sciatic pain joint pain with occasional use of pain medicine.   Hyperlipidemiataking  xetia at night   Hypertension  okno problems     Usually 120/40 or so   History of gout has seen rheumatologist. May have had se of allopuriol with dream issues  So now on uloric and to be seen in ?March   Pain up under shoulder blade and now  Stomach    Under rib cage area.  Moving   novomiting ? If result is feeling better than earlier this week.  Began in the back and came around to the front.  No vomiting does not seem to be related to eating.  Sometimes hurts when she takes a real deep breath but there is no chest pain cough or shortness of breath.  Was just sitting  At onset bretter today but continues .  No UTI symptoms.  Taking tramadol occasionally not at all last week off of the hydrocodone.  Not taking the Atarax medicine.  Is on the [place] and not really taking the aspirin.   Health Maintenance  Topic Date Due  . Hepatitis C Screening  1945-11-24  . INFLUENZA VACCINE  09/25/2016  . MAMMOGRAM  09/20/2017  . COLONOSCOPY  09/22/2018  . TETANUS/TDAP  10/17/2019  . DEXA SCAN  Completed  . 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 She is independent.  ROS:  GEN/ HEENT: No fever, significant weight changes sweats headaches vision problems hearing changes, CV/ PULM; No chest pain shortness of breath cough, syncope,edema  change in exercise tolerance. GI /GU: Novomiting, change in bowel habits. No blood in the stool. No significant GU symptoms.  Does have  some nocturia that is stable SKIN/HEME: ,no acute skin rashes suspicious lesions or bleeding. No lymphadenopathy, nodules, masses.  NEURO/ PSYCH:  No neurologic signs such as weakness numbness. No depression anxiety. IMM/ Allergy: No unusual infections.  Allergy .   REST of 12 system review negative except as per HPI   Past Medical History:  Diagnosis Date  . ANXIETY 11/11/2006  . Arthritis   . BACK PAIN, LUMBAR 07/11/2008  . Chest pain 06/12/2010   Ok stress echo has Gi sx also cw gerd.   Marland Kitchen FASTING HYPERGLYCEMIA 01/12/2007  . History of CT scan of chest   . History of varicella   . HOT FLASHES 04/07/2008  . HYPERLIPIDEMIA 01/12/2007  . HYPERTENSION 11/11/2006  . LEG CRAMPS 01/12/2009  . Normal stress echocardiogram 2012  . 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 SYNCOPE, HX OF 02/10/2007  . UNSPECIFIED ARTHROPATHY SITE UNSPECIFIED 01/12/2007  . Unspecified vitamin D deficiency 02/10/2007    Family History  Problem Relation Age of Onset  . Hypertension Mother   . Diabetes Mother   . Hypertension Sister   . Hypertension Brother   . Hyperlipidemia Brother   . Colon cancer Neg Hx   . Pancreatic cancer Neg Hx   . Rectal cancer Neg Hx   . Stomach cancer Neg Hx  Social History   Social History  . Marital status: Single    Spouse name: N/A  . Number of children: N/A  . Years of education: N/A   Occupational History  . [employer]   Social History Main Topics  . Smoking status: Never Smoker  . Smokeless tobacco: Never Used  . Alcohol use No  . Drug use: No  . Sexual activity: Not Asked   Other Topics Concern  . 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     Outpatient Encounter Prescriptions as of 12/19/2016  Medication Sig  .  Ascorbic Acid (VITAMIN C) 500 MG tablet Take 500 mg by mouth daily.    . COD LIVER OIL PO Take by mouth.  . DEXILANT 60 MG capsule TAKE ONE CAPSULE BY MOUTH ONCE DAILY  . ezetimibe (ZETIA) 10 MG tablet Take 1 tablet (10 mg total) by mouth daily.  Marland Kitchen losartan-hydrochlorothiazide (HYZAAR) 50-12.5 MG tablet Take 1 tablet by mouth daily.  . predniSONE (DELTASONE) 5 MG tablet   . traMADol (ULTRAM) 50 MG tablet Take 1-2 tablets (50-100 mg total) by mouth every 8 (eight) hours as needed.  Marland Kitchen ULORIC 40 MG tablet Take 1 tablet by mouth daily.   . [DISCONTINUED] hydrOXYzine (ATARAX/VISTARIL) 10 MG tablet Take 1-2 tablets (10-20 mg total) by mouth at bedtime as needed for itching.  . [DISCONTINUED] losartan-hydrochlorothiazide (HYZAAR) 50-12.5 MG tablet TAKE ONE TABLET BY MOUTH DAILY  . aspirin 81 MG tablet Take 81 mg by mouth daily. Reported on 03/07/2015  . [DISCONTINUED] allopurinol (ZYLOPRIM) 100 MG tablet   . [DISCONTINUED] HYDROcodone-acetaminophen ([place]) 7.5-325 MG tablet TAKE ONE TO TWO TABLETS BY MOUTH ONCE DAILY AS NEEDED (Patient not taking: Reported on 12/19/2016)   No facility-administered encounter medications on file as of 12/19/2016.     EXAM:  BP (!) 142/88 (BP Location: Right Arm, Patient Position: Sitting, Cuff Size: Large)   Pulse 63   Temp 98.3 F (36.8 C) (Oral)   Wt 232 lb (105.2 kg)   BMI 38.31 kg/m   Body mass index is 38.31 kg/m.  Physical Exam: Vital signs reviewed UKG:URKY is a well-developed well-nourished alert cooperative   who appears stated age in no acute distress.  HEENT: normocephalic atraumatic , Eyes: PERRL EOM's full, conjunctiva clear, Nares: paten,t no deformity discharge or tenderness., Ears: no deformity EAC's clear TMs with normal landmarks. Mouth: clear OP, no lesions, edema.  Moist mucous membranes. Dentition in adequate repair. NECK: supple without masses, thyromegaly or bruits. CHEST/PULM:  Clear to auscultation and percussion breath sounds equal  no wheeze , rales or rhonchi. N CV: PMI is nondisplaced, S1 S2 no gallops, murmurs, rubs.  Pulse 58 regular Peripheral pulses are full without delay.No JVD .  ABDOMEN: Bowel sounds normal tender epigastrium and right upper quadrant without rebound.  Mild left upper quadrant  no  rebound, no hepato splenomegal no CVA tenderness.   Extremtities:  No clubbing cyanosis mild  edema, no acute joint swelling or redness no focal atrophy NEURO:  Oriented x3, cranial nerves 3-12 appear to be intact, no obvious focal weakness,gait within normal limits  SKIN: No acute rashes normal turgor, color, no bruising or petechiae. PSYCH: Oriented, good eye contact, no  obvious depression anxiety, cognition and judgment appear normal. LN: no cervical  adenopathy No noted deficits in memory, attention, and speech.  EKG shows sinus bradycardia compared to last EKG some of the T waves are flipped in the lateral leads.   ASSESSMENT AND PLAN:  Discussed the following assessment and plan:  Upper abdominal pain - Plan: Comprehensive metabolic panel, CBC with Differential/Platelet, POCT Urinalysis Dipstick (Automated), Uric acid, EKG 12-Lead, Ambulatory referral to Cardiology, US Abdomen Complete  HYPERLIPIDEMIA - Plan: Comprehensive metabolic panel, CBC with Differential/Platelet, POCT Urinalysis Dipstick (Automated), Uric acid  Hyperglycemia - Plan: Comprehensive metabolic panel, CBC with Differential/Platelet, POCT Urinalysis Dipstick (Automated), Uric acid  Gout, unspecified cause, unspecified chronicity, unspecified site - Plan: Comprehensive metabolic panel, CBC with Differential/Platelet, POCT Urinalysis Dipstick (Automated), Uric acid  Renal insufficiency  Medication management - Plan: Comprehensive metabolic panel, CBC with Differential/Platelet, POCT Urinalysis Dipstick (Automated), Uric acid  Statin intolerance - Plan: Comprehensive metabolic panel, CBC with Differential/Platelet, POCT Urinalysis Dipstick  (Automated), Uric acid  Need for influenza vaccination - Plan: Flu vaccine HIGH DOSE PF (Fluzone High dose)  T wave inversion in EKG - Plan: Ambulatory referral to Cardiology  Essential hypertension - Plan: Comprehensive metabolic panel, CBC with Differential/Platelet, POCT Urinalysis Dipstick (Automated), Uric acid, EKG 12-Lead  Arthritis History of colectomy bowel surgery but apparently has gallbladder intact. Suspect atypical presentation possible biliary pain. No alarm symptoms otherwise. EKG looks like changes plan cardiology consult for opinion warning given to patient if she gets more typical symptoms to get emergent care. Blood work today abdominal ultrasound then plan follow-up. Chronic disease management and lab work ordered today. Patient Care Team: Levern Kalka, Standley Brooking, MD as PCP - General Carlean Purl Ofilia Neas, MD (Gastroenterology) Addison Lank, MD as Referring Physician (Dermatology) Josue Hector, MD (Cardiology) Valinda Party, MD (Rheumatology)  Patient Instructions  Uncertain cause of your abdominal pain; could be gallbladder  checking lab test and planning an abdominal ultrasound.  Your EKG may have changed from 4 5 years ago as discussed I would like you to see cardiology to be get an opinion to make sure there is no cardiac cause of years symptoms.  Will place a referral to cardiology.  However if you develop serious chest pain shortness of breath advise  emergency evaluation.  We will let you know lab results and then plan follow-up visit. Blood pressure goal should be 120/80 range or similar.    Standley Brooking. Karene Bracken M.D.

## 2016-12-19 ENCOUNTER — Encounter: Payer: Self-pay | Admitting: Internal Medicine

## 2016-12-19 ENCOUNTER — Ambulatory Visit (INDEPENDENT_AMBULATORY_CARE_PROVIDER_SITE_OTHER): Payer: PPO | Admitting: Internal Medicine

## 2016-12-19 VITALS — BP 142/88 | HR 63 | Temp 98.3°F | Wt 232.0 lb

## 2016-12-19 DIAGNOSIS — E782 Mixed hyperlipidemia: Secondary | ICD-10-CM | POA: Diagnosis not present

## 2016-12-19 DIAGNOSIS — R9431 Abnormal electrocardiogram [ECG] [EKG]: Secondary | ICD-10-CM | POA: Diagnosis not present

## 2016-12-19 DIAGNOSIS — Z79899 Other long term (current) drug therapy: Secondary | ICD-10-CM

## 2016-12-19 DIAGNOSIS — N289 Disorder of kidney and ureter, unspecified: Secondary | ICD-10-CM | POA: Diagnosis not present

## 2016-12-19 DIAGNOSIS — R739 Hyperglycemia, unspecified: Secondary | ICD-10-CM | POA: Diagnosis not present

## 2016-12-19 DIAGNOSIS — M109 Gout, unspecified: Secondary | ICD-10-CM | POA: Diagnosis not present

## 2016-12-19 DIAGNOSIS — M1990 Unspecified osteoarthritis, unspecified site: Secondary | ICD-10-CM

## 2016-12-19 DIAGNOSIS — R1010 Upper abdominal pain, unspecified: Secondary | ICD-10-CM

## 2016-12-19 DIAGNOSIS — Z23 Encounter for immunization: Secondary | ICD-10-CM | POA: Diagnosis not present

## 2016-12-19 DIAGNOSIS — I10 Essential (primary) hypertension: Secondary | ICD-10-CM

## 2016-12-19 DIAGNOSIS — Z789 Other specified health status: Secondary | ICD-10-CM | POA: Diagnosis not present

## 2016-12-19 LAB — CBC WITH DIFFERENTIAL/PLATELET
BASOS PCT: 0.8 % (ref 0.0–3.0)
Basophils Absolute: 0 10*3/uL (ref 0.0–0.1)
EOS PCT: 1.3 % (ref 0.0–5.0)
Eosinophils Absolute: 0.1 10*3/uL (ref 0.0–0.7)
HEMATOCRIT: 40.3 % (ref 36.0–46.0)
HEMOGLOBIN: 13.1 g/dL (ref 12.0–15.0)
LYMPHS PCT: 25.5 % (ref 12.0–46.0)
Lymphs Abs: 1.2 10*3/uL (ref 0.7–4.0)
MCHC: 32.5 g/dL (ref 30.0–36.0)
MCV: 83.8 fl (ref 78.0–100.0)
Monocytes Absolute: 0.6 10*3/uL (ref 0.1–1.0)
Monocytes Relative: 13.5 % — ABNORMAL HIGH (ref 3.0–12.0)
Neutro Abs: 2.7 10*3/uL (ref 1.4–7.7)
Neutrophils Relative %: 58.9 % (ref 43.0–77.0)
Platelets: 199 10*3/uL (ref 150.0–400.0)
RBC: 4.81 Mil/uL (ref 3.87–5.11)
RDW: 15.4 % (ref 11.5–15.5)
WBC: 4.7 10*3/uL (ref 4.0–10.5)

## 2016-12-19 LAB — POC URINALSYSI DIPSTICK (AUTOMATED)
Bilirubin, UA: NEGATIVE
Blood, UA: NEGATIVE
GLUCOSE UA: NEGATIVE
Ketones, UA: NEGATIVE
Leukocytes, UA: NEGATIVE
Nitrite, UA: NEGATIVE
Protein, UA: NEGATIVE
SPEC GRAV UA: 1.015 (ref 1.010–1.025)
UROBILINOGEN UA: 0.2 U/dL
pH, UA: 6 (ref 5.0–8.0)

## 2016-12-19 LAB — COMPREHENSIVE METABOLIC PANEL
ALBUMIN: 4.1 g/dL (ref 3.5–5.2)
ALT: 18 U/L (ref 0–35)
AST: 16 U/L (ref 0–37)
Alkaline Phosphatase: 64 U/L (ref 39–117)
BUN: 13 mg/dL (ref 6–23)
CALCIUM: 9.5 mg/dL (ref 8.4–10.5)
CHLORIDE: 102 meq/L (ref 96–112)
CO2: 30 mEq/L (ref 19–32)
Creatinine, Ser: 1.12 mg/dL (ref 0.40–1.20)
GFR: 61.36 mL/min (ref >60.00)
Glucose, Bld: 96 mg/dL (ref 70–99)
POTASSIUM: 3.8 meq/L (ref 3.5–5.1)
Sodium: 140 mEq/L (ref 135–145)
Total Bilirubin: 0.8 mg/dL (ref 0.2–1.2)
Total Protein: 6 g/dL (ref 6.0–8.3)

## 2016-12-19 LAB — URIC ACID: URIC ACID, SERUM: 4.5 mg/dL (ref 2.4–7.0)

## 2016-12-19 MED ORDER — LOSARTAN POTASSIUM-HCTZ 50-12.5 MG PO TABS: 1.0000 | ORAL_TABLET | Freq: Every day | ORAL | 1 refills | Status: DC

## 2016-12-19 NOTE — Patient Instructions (Addendum)
Uncertain cause of your abdominal pain; could be gallbladder  checking lab test and planning an abdominal ultrasound.  Your EKG may have changed from 4 5 years ago as discussed I would like you to see cardiology to be get an opinion to make sure there is no cardiac cause of years symptoms.  Will place a referral to cardiology.  However if you develop serious chest pain shortness of breath advise  emergency evaluation.  We will let you know lab results and then plan follow-up visit. Blood pressure goal should be 120/80 range or similar.

## 2016-12-26 ENCOUNTER — Ambulatory Visit
Admission: RE | Admit: 2016-12-26 | Discharge: 2016-12-26 | Disposition: A | Discharge status: Home / Self Care | Payer: PPO | Source: Ambulatory Visit | Attending: Internal Medicine | Admitting: Internal Medicine

## 2016-12-26 DIAGNOSIS — R1010 Upper abdominal pain, unspecified: Secondary | ICD-10-CM

## 2016-12-26 DIAGNOSIS — K760 Fatty (change of) liver, not elsewhere classified: Secondary | ICD-10-CM | POA: Diagnosis not present

## 2017-01-12 ENCOUNTER — Other Ambulatory Visit: Payer: Self-pay | Admitting: Internal Medicine

## 2017-01-26 NOTE — Progress Notes (Signed)
Cardiology Office Note   Date:  01/28/2017   ID:  Cynthia Estrada, DOB 1945-08-26, MRN [id number]  PCP:  Burnis Medin, MD    No chief complaint on file. abnormal ECG   Wt Readings from Last 3 Encounters:  01/28/17 233 lb (105.7 kg)  12/19/16 232 lb (105.2 kg)  06/20/16 236 lb (107 kg)       History of Present Illness: Cynthia Estrada is a 71 y.o. female who is being seen today for the evaluation of abnormal ECG at the request of Panosh, Standley Brooking, MD.  She has a history of hyperlipidemia, hypertension and renal insufficiency.  She had a routine ECG in October 2018 showing T-wave inversions in the anterior and lateral leads. Prior ECG in 2012 showed some T-wave inversions only in the anterior leads. Given the change, she was referred for further evaluation.  Denies : Chest pain. Dizziness. Leg edema. Nitroglycerin use. Orthopnea. Palpitations. Paroxysmal nocturnal dyspnea. Shortness of breath. Syncope.   She does report some GERD sx.  It is triggered by stress, or eating something spicy.    She walks regularly and she can have some DOE.  More fatigue when walking up hill.  No stairs in her home.   Many years ago, she had a stress test.    No family h/o heart disease.  She is not a smoker. No significant h/o cigarette smoking.   BP well controlled outside of MDs office.  Readings in the 128/80 range.   She has been statin intolerant.  She tried lipitor and pravastatin but does not recall any other medicine names.  She had muscle aches with both.    Past Medical History:  Diagnosis Date  . ANXIETY 11/11/2006  . Arthritis   . BACK PAIN, LUMBAR 07/11/2008  . Chest pain 06/12/2010   Ok stress echo has Gi sx also cw gerd.   Marland Kitchen FASTING HYPERGLYCEMIA 01/12/2007  . History of CT scan of chest   . History of varicella   . HOT FLASHES 04/07/2008  . HYPERLIPIDEMIA 01/12/2007  . HYPERTENSION 11/11/2006  . LEG CRAMPS 01/12/2009  . Normal stress echocardiogram 2012  .  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 SYNCOPE, HX OF 02/10/2007  . UNSPECIFIED ARTHROPATHY SITE UNSPECIFIED 01/12/2007  . Unspecified vitamin D deficiency 02/10/2007    Past Surgical History:  Procedure Laterality Date  . ABDOMINAL HYSTERECTOMY     age 74 for fibroids  . COLONOSCOPY  07/01/2009   Two tubulovillous adenoms 7 and 4 cm size  . COLONOSCOPY W/ POLYPECTOMY  07/11/2010   two 7-8 mm polyps removed - adenomas  . hemicollectomy  08/2009   Right for tubulovillous adenomas (2)     Current Outpatient Medications  Medication Sig Dispense Refill  . Ascorbic Acid (VITAMIN C) 500 MG tablet Take 500 mg by mouth daily.      . COD LIVER OIL PO Take by mouth.    . DEXILANT 60 MG capsule TAKE 1 CAPSULE BY MOUTH ONCE DAILY 90 capsule 1  . ezetimibe (ZETIA) 10 MG tablet Take 1 tablet (10 mg total) by mouth daily. 90 tablet 3  . losartan-hydrochlorothiazide (HYZAAR) 50-12.5 MG tablet Take 1 tablet by mouth daily. 90 tablet 1  . predniSONE (DELTASONE) 5 MG tablet Take 5 mg by mouth daily.     . traMADol (ULTRAM) 50 MG tablet Take 1-2 tablets (50-100 mg total) by mouth every 8 (eight) hours as needed. 40 tablet 0  .  ULORIC 40 MG tablet Take 1 tablet by mouth daily.      No current facility-administered medications for this visit.     Allergies:   Simvastatin; Atorvastatin; Lisinopril; and Pravastatin    Social History:  The patient  reports that  has never smoked. she has never used smokeless tobacco. She reports that she does not drink alcohol or use drugs.   Family History:  The patient's family history includes Diabetes in her mother; Hyperlipidemia in her brother; Hypertension in her brother, mother, and sister.    ROS:  Please see the history of present illness.   Otherwise, review of systems are positive for rare ankle swelling.   All other systems are reviewed and negative.    PHYSICAL EXAM: VS:  BP (!) 142/88 (BP Location: Right Arm, Patient  Position: Sitting, Cuff Size: Large)   Pulse 77   Ht 5' 5.25" (1.657 m)   Wt 233 lb (105.7 kg)   SpO2 99%   BMI 38.48 kg/m  , BMI Body mass index is 38.48 kg/m. GEN: Well nourished, well developed, in no acute distress  HEENT: normal  Neck: no JVD, carotid bruits, or masses Cardiac: normal rate, premature beats; no murmurs, rubs, or gallops,no edema  Respiratory:  clear to auscultation bilaterally, normal work of breathing GI: soft, nontender, nondistended, + BS MS: no deformity or atrophy , DP 2+ pulses bilaterally Skin: warm and dry, no rash Neuro:  Strength and sensation are intact Psych: euthymic mood, full affect   EKG:   The ekg ordered in 10/18 demonstrates NSR, with anterior and lateral T wave inversion   Recent Labs: 12/19/2016: ALT 18; BUN 13; Creatinine, Ser 1.12; Hemoglobin 13.1; Platelets 199.0; Potassium 3.8; Sodium 140   Lipid Panel    Component Value Date/Time   CHOL 231 (H) 06/13/2016 0847   TRIG 136.0 06/13/2016 0847   TRIG 76 01/15/2006 0809   HDL 60.90 06/13/2016 0847   CHOLHDL 4 06/13/2016 0847   VLDL 27.2 06/13/2016 0847   LDLCALC 143 (H) 06/13/2016 0847   LDLDIRECT 171.0 12/20/2015 0826     Other studies Reviewed: Additional studies/ records that were reviewed today with results demonstrating: Prior ECG reviewed along with labs.   ASSESSMENT AND PLAN:  1. Abnormal ECG : Some chest discomfort although it is atypical. Given her risk factors for coronary artery disease, would plan on exercise nuclear stress test in the setting of abnormal ECG. She is not sure whether she will be able to walk on the treadmill. Okay to switch to chemical stress test if she is unable to achieve target heart rate. 2. HTN: Controlled, even outside of the doctor's office. 3. Hyperlipidemia: LDL above target in April. She was started on Zetia. She has been intolerant to Lipitor and pravastatin. Could try Crestor 10 mg once a week. Could also try adding co-q 10, which can  help with the tolerability of statins. Could also try supplementing vitamin D level that is low.  4. Borderline blood sugars.  A1C 6.3.    Current medicines are reviewed at length with the patient today.  The patient concerns regarding her medicines were addressed.  The following changes have been made:  No change  Labs/ tests ordered today include: Stress test  No orders of the defined types were placed in this encounter.   Recommend 150 minutes/week of aerobic exercise Low fat, low carb, high fiber diet recommended  Disposition:   FU for stress test   Signed, Larae Grooms, MD  01/28/2017 8:52 AM    [address], [place], [place]  [address] Phone: [phone]; Fax: [phone]

## 2017-01-27 ENCOUNTER — Other Ambulatory Visit: Payer: Self-pay | Admitting: Internal Medicine

## 2017-01-28 ENCOUNTER — Encounter: Payer: Self-pay | Admitting: Interventional Cardiology

## 2017-01-28 ENCOUNTER — Ambulatory Visit (INDEPENDENT_AMBULATORY_CARE_PROVIDER_SITE_OTHER): Payer: PPO | Admitting: Interventional Cardiology

## 2017-01-28 ENCOUNTER — Encounter (INDEPENDENT_AMBULATORY_CARE_PROVIDER_SITE_OTHER): Payer: Self-pay

## 2017-01-28 VITALS — BP 142/88 | HR 77 | Ht 65.25 in | Wt 233.0 lb

## 2017-01-28 DIAGNOSIS — R0789 Other chest pain: Secondary | ICD-10-CM

## 2017-01-28 DIAGNOSIS — I10 Essential (primary) hypertension: Secondary | ICD-10-CM | POA: Diagnosis not present

## 2017-01-28 DIAGNOSIS — R7303 Prediabetes: Secondary | ICD-10-CM | POA: Diagnosis not present

## 2017-01-28 DIAGNOSIS — R9431 Abnormal electrocardiogram [ECG] [EKG]: Secondary | ICD-10-CM | POA: Diagnosis not present

## 2017-01-28 DIAGNOSIS — E782 Mixed hyperlipidemia: Secondary | ICD-10-CM | POA: Diagnosis not present

## 2017-01-28 NOTE — Patient Instructions (Signed)
Medication Instructions:  Your physician recommends that you continue on your current medications as directed. Please refer to the Current Medication list given to you today.   Labwork: None ordered  Testing/Procedures: Your physician has requested that you have en exercise stress myoview. For further information please visit [email or web address]. Please follow instruction sheet, as given.  Follow-Up: Based on test results   Any Other Special Instructions Will Be Listed Below (If Applicable).     If you need a refill on your cardiac medications before your next appointment, please call your pharmacy.   

## 2017-01-29 NOTE — Telephone Encounter (Signed)
Tramadol Last filled 06/20/16,  #40 tabs x 0 refills.  Seen last 12/19/16 Please advise Dr Regis Bill, thanks.

## 2017-01-29 NOTE — Telephone Encounter (Signed)
Ok to refill x 1  

## 2017-01-31 NOTE — Telephone Encounter (Signed)
Refill called in to pharmacy. Nothing further needed.

## 2017-02-04 ENCOUNTER — Encounter (HOSPITAL_COMMUNITY): Payer: PPO

## 2017-02-06 ENCOUNTER — Ambulatory Visit (HOSPITAL_COMMUNITY): Payer: PPO | Attending: Interventional Cardiology

## 2017-02-06 DIAGNOSIS — R0789 Other chest pain: Secondary | ICD-10-CM

## 2017-02-06 DIAGNOSIS — R9431 Abnormal electrocardiogram [ECG] [EKG]: Secondary | ICD-10-CM | POA: Diagnosis not present

## 2017-02-06 MED ORDER — TECHNETIUM TC 99M TETROFOSMIN IV KIT
31.6000 | PACK | Freq: Once | INTRAVENOUS | Status: AC | PRN
  Administered 2017-02-06: 31.6 via INTRAVENOUS
  Filled 2017-02-06: qty 32

## 2017-02-06 MED ORDER — REGADENOSON 0.4 MG/5ML IV SOLN
0.4000 mg | Freq: Once | INTRAVENOUS | Status: AC
  Administered 2017-02-06: 0.4 mg via INTRAVENOUS

## 2017-02-06 MED ORDER — TECHNETIUM TC 99M TETROFOSMIN IV KIT
31.4000 | PACK | Freq: Once | INTRAVENOUS | Status: AC | PRN
  Administered 2017-02-06: 31.4 via INTRAVENOUS
  Filled 2017-02-06: qty 32

## 2017-02-06 MED ORDER — TECHNETIUM TC 99M TETROFOSMIN IV KIT
10.1000 | PACK | Freq: Once | INTRAVENOUS | Status: AC | PRN
  Filled 2017-02-06: qty 11

## 2017-02-12 ENCOUNTER — Ambulatory Visit (HOSPITAL_COMMUNITY): Payer: PPO | Attending: Internal Medicine

## 2017-02-12 LAB — MYOCARDIAL PERFUSION IMAGING
CHL CUP NUCLEAR SDS: 3
CHL CUP NUCLEAR SRS: 3
LHR: 0.38
NUC STRESS TID: 0.89
Peak HR: 113 {beats}/min
Rest HR: 76 {beats}/min
SSS: 5

## 2017-02-12 MED ORDER — TECHNETIUM TC 99M TETROFOSMIN IV KIT
32.1000 | PACK | Freq: Once | INTRAVENOUS | Status: AC | PRN
  Administered 2017-02-12: 32.1 via INTRAVENOUS
  Filled 2017-02-12: qty 33

## 2017-03-21 ENCOUNTER — Telehealth: Payer: Self-pay | Admitting: Internal Medicine

## 2017-03-21 DIAGNOSIS — R69 Illness, unspecified: Secondary | ICD-10-CM | POA: Diagnosis not present

## 2017-03-21 NOTE — Telephone Encounter (Signed)
Pt attempted to pick up Hyzaar form the pharmacy and was told that the medication could not be dispensed because of the recall. Pt is out of the medication and would like for Dr. Regis Bill to call in an alternative medication. Pt uses [facility] on [address].

## 2017-03-21 NOTE — Telephone Encounter (Signed)
Copied from [place]. Topic: Quick Communication - Rx Refill/Question >> Mar 21, 2017 12:08 PM Cynthia Estrada wrote: Medication: losartan-hydrochlorothiazide (HYZAAR) 50-12.5 MG tablet  (PATIENT WENT TO THE PHARMACY TO PICK UP THE ABOVE Rx, BUT WAS TOLD THEY COULD NOT GIVE IT TO HER BECAUSE OF THE RECALL. PATIENT WOULD LIKE FOR Cynthia Estrada TO CALL HER IN AN ALTERNATIVE RX) PATIENT IS TOTALLY OUT OF HER MED.  Has the patient contacted their pharmacy? Yes.     (Agent: If no, request that the patient contact the pharmacy for the refill.)   Preferred Pharmacy (with phone number or street name):  [facility], [address] [place] [place] [address] Phone: [phone] Fax: [phone]     Agent: Please be advised that RX refills may take up to 3 business days. We ask that you follow-up with your pharmacy.

## 2017-03-24 NOTE — Telephone Encounter (Signed)
Pt is completely out of her medication and needs an alternative called in.  Current Medication: losartan-hydrochlorothiazide (HYZAAR) 50-12.5 MG tablet has been recalled.   Please advise Dr Sarajane Jews if able to give recommendations of an alternative as Dr Regis Bill is nor available. The patient has no meds left at this time.   Thanks.

## 2017-03-25 DIAGNOSIS — Z0100 Encounter for examination of eyes and vision without abnormal findings: Secondary | ICD-10-CM | POA: Diagnosis not present

## 2017-03-25 NOTE — Telephone Encounter (Signed)
We can separate the 2 meds. Call in Losartan 50 mg daily, #30 with no rf. Also HCTZ 12.5 mg daily, #30 with no rf

## 2017-03-26 MED ORDER — HYDROCHLOROTHIAZIDE 12.5 MG PO CAPS: 12.5000 mg | ORAL_CAPSULE | Freq: Every day | ORAL | 0 refills | Status: DC

## 2017-03-26 MED ORDER — LOSARTAN POTASSIUM 50 MG PO TABS: 50.0000 mg | ORAL_TABLET | Freq: Every day | ORAL | 0 refills | Status: DC

## 2017-03-26 NOTE — Telephone Encounter (Signed)
Pt aware of medication changes.  Rx's called in to pharmacy.  Will send to Dr Regis Bill as Juluis Rainier of changes made.

## 2017-03-30 NOTE — Telephone Encounter (Signed)
I agree ( was out of office when requested )

## 2017-04-24 ENCOUNTER — Other Ambulatory Visit: Payer: Self-pay | Admitting: Family Medicine

## 2017-04-25 DIAGNOSIS — Z79899 Other long term (current) drug therapy: Secondary | ICD-10-CM | POA: Diagnosis not present

## 2017-04-25 DIAGNOSIS — E669 Obesity, unspecified: Secondary | ICD-10-CM | POA: Diagnosis not present

## 2017-04-25 DIAGNOSIS — R768 Other specified abnormal immunological findings in serum: Secondary | ICD-10-CM | POA: Diagnosis not present

## 2017-04-25 DIAGNOSIS — M25569 Pain in unspecified knee: Secondary | ICD-10-CM | POA: Diagnosis not present

## 2017-04-25 DIAGNOSIS — M109 Gout, unspecified: Secondary | ICD-10-CM | POA: Diagnosis not present

## 2017-04-25 DIAGNOSIS — M1990 Unspecified osteoarthritis, unspecified site: Secondary | ICD-10-CM | POA: Diagnosis not present

## 2017-04-25 LAB — BASIC METABOLIC PANEL
BUN: 10 (ref 4–21)
CREATININE: 1.2 — AB (ref 0.5–1.1)
GLUCOSE: 92
Potassium: 4.2 (ref 3.4–5.3)
Sodium: 139 (ref 137–147)

## 2017-04-25 LAB — HEPATIC FUNCTION PANEL
ALT: 28 (ref 7–35)
AST: 21 (ref 13–35)
Bilirubin, Total: 89

## 2017-04-25 LAB — CBC AND DIFFERENTIAL
HCT: 42 (ref 36–46)
Hemoglobin: 13.9 (ref 12.0–16.0)
PLATELETS: 214 (ref 150–399)
WBC: 4.5

## 2017-05-08 ENCOUNTER — Encounter: Payer: Self-pay | Admitting: Internal Medicine

## 2017-07-02 NOTE — Progress Notes (Signed)
Chief Complaint  Patient presents with  . Annual Exam    HPI: Cynthia Estrada 72 y.o. comes in today for Preventive Medicare exam/  And Chronic disease management  visit . A couple iof issues   Itching  No rash  ? Could it be  From [employer] on by rheum  BP ok   Lipids  No se of med?  Pain med tramadol dosent really work    On going sweating when hot for years not noight sweats   Not exercise or food related  Better in winter   Sleep bed 9 awakesn 3 or so hard to go back to sleep .  No illness  With this  Tried cohash ? Other no help    Gi meds  continue  Health Maintenance  Topic Date Due  . Hepatitis C Screening  Jun 21, 1945  . MAMMOGRAM  09/20/2017  . INFLUENZA VACCINE  09/25/2017  . COLONOSCOPY  09/22/2018  . TETANUS/TDAP  10/17/2019  . DEXA SCAN  Completed  . PNA vac Low Risk Adult  Completed   Health Maintenance Review LIFESTYLE:  Exercise:    Some not a lot .  Tobacco/ETS:n Alcohol:  no Sugar beverages: Sleep:  A problem   Drug use: no HH:  1 no pets    Hearing: ok  Vision:  No limitations at present . Last eye check UTD  Safety:  Has smoke detector and wears seat belts.  No firearms. No excess sun exposure. Sees dentist regularly.  Falls:  no  Memory: Felt to be good  , no concern from her or her family.  Depression: No anhedonia unusual crying or depressive symptoms  Nutrition: Eats well balanced diet; adequate calcium and vitamin D. No swallowing chewing problems.  Injury: no major injuries in the last six months.  Other healthcare providers:  Reviewed today .Marland Kitchen   Preventive parameters:   Reviewed   ADLS:   There are no problems or need for assistance  driving, feeding, obtaining food, dressing, toileting and bathing, managing money using phone. She is independent.   ROS:   See hpi GEN/ HEENT: No fever, significant weight changes sweats headaches vision problems hearing changes, CV/ PULM; No chest pain shortness of breath cough,  syncope,edema  change in exercise tolerance. GI /GU: No adominal pain, vomiting, change in bowel habits. No blood in the stool. No significant GU symptoms. SKIN/HEME: ,no acute skin rashes suspicious lesions or bleeding. No lymphadenopathy, nodules, masses.  NEURO/ PSYCH:  No neurologic signs such as weakness numbness. No depression anxiety. IMM/ Allergy: No unusual infections.  Allergy .   REST of 12 system review negative except as per HPI   Past Medical History:  Diagnosis Date  . ANXIETY 11/11/2006  . Arthritis   . BACK PAIN, LUMBAR 07/11/2008  . Chest pain 06/12/2010   Ok stress echo has Gi sx also cw gerd.   Marland Kitchen FASTING HYPERGLYCEMIA 01/12/2007  . History of CT scan of chest   . History of varicella   . HOT FLASHES 04/07/2008  . HYPERLIPIDEMIA 01/12/2007  . HYPERTENSION 11/11/2006  . LEG CRAMPS 01/12/2009  . Normal stress echocardiogram 2012  . 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 SYNCOPE, HX OF 02/10/2007  . UNSPECIFIED ARTHROPATHY SITE UNSPECIFIED 01/12/2007  . Unspecified vitamin D deficiency 02/10/2007    Family History  Problem Relation Age of Onset  . Hypertension Mother   . Diabetes Mother   . Hypertension Sister   .  Hypertension Brother   . Hyperlipidemia Brother   . Colon cancer Neg Hx   . Pancreatic cancer Neg Hx   . Rectal cancer Neg Hx   . Stomach cancer Neg Hx     Social History   Socioeconomic History  . Marital status: Single    Spouse name: Not on file  . Number of children: Not on file  . Years of education: Not on file  . Highest education level: Not on file  Occupational History  . Occupation: Retire    Comment: Catering manager  . Financial resource strain: Not on file  . Food insecurity:    Worry: Not on file    Inability: Not on file  . Transportation needs:    Medical: Not on file    Non-medical: Not on file  Tobacco Use  . Smoking status: Never Smoker  . Smokeless tobacco: Never Used    Substance and Sexual Activity  . Alcohol use: No  . Drug use: No  . Sexual activity: Not on file  Lifestyle  . Physical activity:    Days per week: Not on file    Minutes per session: Not on file  . Stress: Not on file  Relationships  . Social connections:    Talks on phone: Not on file    Gets together: Not on file    Attends religious service: Not on file    Active member of club or organization: Not on file    Attends meetings of clubs or organizations: Not on file    Relationship status: Not on file  Other Topics Concern  . 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     Outpatient Encounter Medications as of 07/04/2017  Medication Sig  . Ascorbic Acid (VITAMIN C) 500 MG tablet Take 500 mg by mouth daily.    . COD LIVER OIL PO Take by mouth.  . DEXILANT 60 MG capsule TAKE 1 CAPSULE BY MOUTH ONCE DAILY  . ezetimibe (ZETIA) 10 MG tablet TAKE ONE TABLET BY MOUTH  DAILY  . hydrochlorothiazide (MICROZIDE) 12.5 MG capsule TAKE 1 CAPSULE BY MOUTH ONCE DAILY  . losartan (COZAAR) 50 MG tablet TAKE 1 TABLET BY MOUTH ONCE DAILY  . losartan-hydrochlorothiazide (HYZAAR) 50-12.5 MG tablet Take 1 tablet by mouth daily.  . traMADol (ULTRAM) 50 MG tablet TAKE 1 TO 2 TABLETS BY MOUTH EVERY 8 HOURS AS NEEDED  . ULORIC 40 MG tablet Take 1 tablet by mouth daily.   . [DISCONTINUED] predniSONE (DELTASONE) 5 MG tablet Take 5 mg by mouth daily.    Facility-Administered Encounter Medications as of 07/04/2017  Medication  . technetium tetrofosmin (TC-MYOVIEW) injection 57.3 millicurie    EXAM:  BP 124/88 (BP Location: Left Arm, Patient Position: Sitting, Cuff Size: Large)   Pulse 81   Temp 98.2 F (36.8 C) (Oral)   Ht 5\' 6"  (1.676 m)   Wt 227 lb 9.6 oz (103.2 kg)   BMI 36.74 kg/m   Body mass index  is 36.74 kg/m.  Physical Exam: Vital signs reviewed UKG:URKY is a well-developed well-nourished alert cooperative  Female  who appears stated age in no acute distress.  HEENT: normocephalic atraumatic , Eyes: PERRL EOM's full, conjunctiva clear,glasses  Nares: paten,t no deformity discharge or tenderness., Ears: no deformity EAC's clear TMs with normal landmarks. Mouth: clear OP, no lesions, edema.  Moist mucous membranes. Dentition in adequate repair. NECK: supple without masses, thyromegaly or bruits. CHEST/PULM:  Clear to auscultation and percussion breath sounds equal no wheeze , rales or rhonchi. No chest wall deformities or tenderness.Breast: normal by inspection . No dimpling, discharge, masses, tenderness or discharge . CV: PMI is nondisplaced, S1 S2 no gallops, murmurs, rubs. Peripheral pulses are full without delay.No JVD .  ABDOMEN: Bowel sounds normal nontender  No guard or rebound, no hepato splenomegal no CVA tenderness.   Extremtities:  No clubbing cyanosis or edema, no acute joint swelling or redness no focal atrophy NEURO:  Oriented x3, cranial nerves 3-12 appear to be intact, no obvious focal weakness,gait within normal limits no abnormal reflexes or asymmetrical SKIN: No acute rashes normal turgor, color, no bruising or petechiae. PSYCH: Oriented, good eye contact, no obvious depression anxiety, cognition and judgment appear normal. LN: no cervical axillary inguinal adenopathy No noted deficits in memory, attention, and speech.   Lab Results  Component Value Date   WBC 4.5 04/25/2017   HGB 13.9 04/25/2017   HCT 42 04/25/2017   PLT 214 04/25/2017   GLUCOSE 96 12/19/2016   CHOL 254 (H) 07/04/2017   TRIG 185.0 (H) 07/04/2017   HDL 66.40 07/04/2017   LDLDIRECT 171.0 12/20/2015   LDLCALC 151 (H) 07/04/2017   ALT 28 04/25/2017   AST 21 04/25/2017   NA 139 04/25/2017   K 4.2 04/25/2017   CL 102 12/19/2016   CREATININE 1.2 (A) 04/25/2017   BUN 10 04/25/2017   CO2 30  12/19/2016   TSH 1.61 07/04/2017   HGBA1C 6.2 07/04/2017     ASSESSMENT AND PLAN:  Discussed the following assessment and plan:  Visit for preventive health examination  Medication management - Plan: Hemoglobin A1c, Lipid panel, TSH, T4, free, POCT Urinalysis Dipstick (Automated)  Essential hypertension - Plan: Hemoglobin A1c, Lipid panel, TSH, T4, free, POCT Urinalysis Dipstick (Automated)  HYPERLIPIDEMIA - Plan: Hemoglobin A1c, Lipid panel, TSH, T4, free, POCT Urinalysis Dipstick (Automated)  Heat intolerance - Plan: Hemoglobin A1c, Lipid panel, TSH, T4, free, POCT Urinalysis Dipstick (Automated)  Itching - Plan: Hemoglobin A1c, Lipid panel, TSH, T4, free, POCT Urinalysis Dipstick (Automated)  Hyperglycemia - Plan: Hemoglobin A1c, Lipid panel, TSH, T4, free, POCT Urinalysis Dipstick (Automated)  Sleep disturbance - Plan: Hemoglobin A1c, Lipid panel, TSH, T4, free, POCT Urinalysis Dipstick (Automated)  Screening for depression  Renal insufficiency - Plan: Hemoglobin A1c, Lipid panel, TSH, T4, free, POCT Urinalysis Dipstick (Automated)  Need for hepatitis C screening test - Plan: Hepatitis C antibody  Statin intolerance revewied all above topics and plan     3 top priority and  Med  Evaluation  Extra time  In addition to  cpx  Patient Care Team: Burnis Medin, MD as PCP - General Carlean Purl Ofilia Neas, MD (Gastroenterology) Addison Lank, MD as Referring Physician (Dermatology) Josue Hector, MD (Cardiology) Valinda Party, MD (Rheumatology)  Patient Instructions   Can try meal tonin 1 hours   Pre bedtime   Not sure why itching   Could be medication   At times  But not sure  Tramadol ?  ? Hctz?  Less likely without rash  Ask  Rheum also about sx .  Get yoru mammogram  Can get shingles vaccine at you pharmacy as available        Insomnia Insomnia  is a sleep disorder that makes it difficult to fall asleep or to stay asleep. Insomnia can cause tiredness (fatigue), low  energy, difficulty concentrating, mood swings, and poor performance at work or school. There are three different ways to classify insomnia:  Difficulty falling asleep.  Difficulty staying asleep.  Waking up too early in the morning.  Any type of insomnia can be long-term (chronic) or short-term (acute). Both are common. Short-term insomnia usually lasts for three months or less. Chronic insomnia occurs at least three times a week for longer than three months. What are the causes? Insomnia may be caused by another condition, situation, or substance, such as:  Anxiety.  Certain medicines.  Gastroesophageal reflux disease (GERD) or other gastrointestinal conditions.  Asthma or other breathing conditions.  Restless legs syndrome, sleep apnea, or other sleep disorders.  Chronic pain.  Menopause. This may include hot flashes.  Stroke.  Abuse of alcohol, tobacco, or illegal drugs.  Depression.  Caffeine.  Neurological disorders, such as Alzheimer disease.  An overactive thyroid (hyperthyroidism).  The cause of insomnia may not be known. What increases the risk? Risk factors for insomnia include:  Gender. Women are more commonly affected than men.  Age. Insomnia is more common as you get older.  Stress. This may involve your professional or personal life.  Income. Insomnia is more common in people with lower income.  Lack of exercise.  Irregular work schedule or night shifts.  Traveling between different time zones.  What are the signs or symptoms? If you have insomnia, trouble falling asleep or trouble staying asleep is the main symptom. This may lead to other symptoms, such as:  Feeling fatigued.  Feeling nervous about going to sleep.  Not feeling rested in the morning.  Having trouble concentrating.  Feeling irritable, anxious, or depressed.  How is this treated? Treatment for insomnia depends on the cause. If your insomnia is caused by an underlying  condition, treatment will focus on addressing the condition. Treatment may also include:  Medicines to help you sleep.  Counseling or therapy.  Lifestyle adjustments.  Follow these instructions at home:  Take medicines only as directed by your health care provider.  Keep regular sleeping and waking hours. Avoid naps.  Keep a sleep diary to help you and your health care provider figure out what could be causing your insomnia. Include: ? When you sleep. ? When you wake up during the night. ? How well you sleep. ? How rested you feel the next day. ? Any side effects of medicines you are taking. ? What you eat and drink.  Make your bedroom a comfortable place where it is easy to fall asleep: ? Put up shades or special blackout curtains to block light from outside. ? Use a white noise machine to block noise. ? Keep the temperature cool.  Exercise regularly as directed by your health care provider. Avoid exercising right before bedtime.  Use relaxation techniques to manage stress. Ask your health care provider to suggest some techniques that may work well for you. These may include: ? Breathing exercises. ? Routines to release muscle tension. ? Visualizing peaceful scenes.  Cut back on alcohol, caffeinated beverages, and cigarettes, especially close to bedtime. These can disrupt your sleep.  Do not overeat or eat spicy foods right before bedtime. This can lead to digestive discomfort that can make it hard for you to sleep.  Limit screen use before bedtime. This includes: ? Watching TV. ? Using your smartphone, tablet, and computer.  Stick to a routine. This can help you fall asleep faster. Try to do a quiet activity, brush your teeth, and go to bed at the same time each night.  Get out of bed if you are still awake after 15 minutes of trying to sleep. Keep the lights down, but try reading or doing a quiet activity. When you feel sleepy, go back to bed.  Make sure that you  drive carefully. Avoid driving if you feel very sleepy.  Keep all follow-up appointments as directed by your health care provider. This is important. Contact a health care provider if:  You are tired throughout the day or have trouble in your daily routine due to sleepiness.  You continue to have sleep problems or your sleep problems get worse. Get help right away if:  You have serious thoughts about hurting yourself or someone else. This information is not intended to replace advice given to you by your health care provider. Make sure you discuss any questions you have with your health care provider. Document Released: 02/09/2000 Document Revised: 07/14/2015 Document Reviewed: 11/12/2013 Elsevier Interactive Patient Education  2018 [employer]. Autumn Gunn M.D.

## 2017-07-04 ENCOUNTER — Encounter: Payer: Self-pay | Admitting: Internal Medicine

## 2017-07-04 ENCOUNTER — Ambulatory Visit (INDEPENDENT_AMBULATORY_CARE_PROVIDER_SITE_OTHER): Payer: Medicare HMO | Admitting: Internal Medicine

## 2017-07-04 VITALS — BP 124/88 | HR 81 | Temp 98.2°F | Ht 66.0 in | Wt 227.6 lb

## 2017-07-04 DIAGNOSIS — I10 Essential (primary) hypertension: Secondary | ICD-10-CM | POA: Diagnosis not present

## 2017-07-04 DIAGNOSIS — N289 Disorder of kidney and ureter, unspecified: Secondary | ICD-10-CM | POA: Diagnosis not present

## 2017-07-04 DIAGNOSIS — L299 Pruritus, unspecified: Secondary | ICD-10-CM

## 2017-07-04 DIAGNOSIS — Z789 Other specified health status: Secondary | ICD-10-CM | POA: Diagnosis not present

## 2017-07-04 DIAGNOSIS — Z79899 Other long term (current) drug therapy: Secondary | ICD-10-CM

## 2017-07-04 DIAGNOSIS — Z1331 Encounter for screening for depression: Secondary | ICD-10-CM | POA: Diagnosis not present

## 2017-07-04 DIAGNOSIS — E782 Mixed hyperlipidemia: Secondary | ICD-10-CM

## 2017-07-04 DIAGNOSIS — R739 Hyperglycemia, unspecified: Secondary | ICD-10-CM

## 2017-07-04 DIAGNOSIS — G479 Sleep disorder, unspecified: Secondary | ICD-10-CM

## 2017-07-04 DIAGNOSIS — Z0000 Encounter for general adult medical examination without abnormal findings: Secondary | ICD-10-CM

## 2017-07-04 DIAGNOSIS — R6889 Other general symptoms and signs: Secondary | ICD-10-CM | POA: Diagnosis not present

## 2017-07-04 DIAGNOSIS — Z1159 Encounter for screening for other viral diseases: Secondary | ICD-10-CM

## 2017-07-04 LAB — POC URINALSYSI DIPSTICK (AUTOMATED)
Bilirubin, UA: NEGATIVE
Blood, UA: NEGATIVE
GLUCOSE UA: NEGATIVE
Ketones, UA: NEGATIVE
Leukocytes, UA: NEGATIVE
NITRITE UA: NEGATIVE
PROTEIN UA: NEGATIVE
Spec Grav, UA: 1.025 (ref 1.010–1.025)
UROBILINOGEN UA: 0.2 U/dL
pH, UA: 6 (ref 5.0–8.0)

## 2017-07-04 LAB — LIPID PANEL
CHOLESTEROL: 254 mg/dL — AB (ref 0–200)
HDL: 66.4 mg/dL (ref >39.00)
LDL Cholesterol: 151 mg/dL — ABNORMAL HIGH (ref 0–99)
NonHDL: 188.06
Total CHOL/HDL Ratio: 4
Triglycerides: 185 mg/dL — ABNORMAL HIGH (ref 0.0–149.0)
VLDL: 37 mg/dL (ref 0.0–40.0)

## 2017-07-04 LAB — T4, FREE: Free T4: 0.77 ng/dL (ref 0.60–1.60)

## 2017-07-04 LAB — TSH: TSH: 1.61 u[IU]/mL (ref 0.35–4.50)

## 2017-07-04 LAB — HEMOGLOBIN A1C: HEMOGLOBIN A1C: 6.2 % (ref 4.6–6.5)

## 2017-07-04 NOTE — Patient Instructions (Addendum)
Can try meal tonin 1 hours   Pre bedtime   Not sure why itching   Could be medication   At times  But not sure  Tramadol ?  ? Hctz?  Less likely without rash  Ask  Rheum also about sx .  Get yoru mammogram  Can get shingles vaccine at you pharmacy as available        Insomnia Insomnia is a sleep disorder that makes it difficult to fall asleep or to stay asleep. Insomnia can cause tiredness (fatigue), low energy, difficulty concentrating, mood swings, and poor performance at work or school. There are three different ways to classify insomnia:  Difficulty falling asleep.  Difficulty staying asleep.  Waking up too early in the morning.  Any type of insomnia can be long-term (chronic) or short-term (acute). Both are common. Short-term insomnia usually lasts for three months or less. Chronic insomnia occurs at least three times a week for longer than three months. What are the causes? Insomnia may be caused by another condition, situation, or substance, such as:  Anxiety.  Certain medicines.  Gastroesophageal reflux disease (GERD) or other gastrointestinal conditions.  Asthma or other breathing conditions.  Restless legs syndrome, sleep apnea, or other sleep disorders.  Chronic pain.  Menopause. This may include hot flashes.  Stroke.  Abuse of alcohol, tobacco, or illegal drugs.  Depression.  Caffeine.  Neurological disorders, such as Alzheimer disease.  An overactive thyroid (hyperthyroidism).  The cause of insomnia may not be known. What increases the risk? Risk factors for insomnia include:  Gender. Women are more commonly affected than men.  Age. Insomnia is more common as you get older.  Stress. This may involve your professional or personal life.  Income. Insomnia is more common in people with lower income.  Lack of exercise.  Irregular work schedule or night shifts.  Traveling between different time zones.  What are the signs or symptoms? If  you have insomnia, trouble falling asleep or trouble staying asleep is the main symptom. This may lead to other symptoms, such as:  Feeling fatigued.  Feeling nervous about going to sleep.  Not feeling rested in the morning.  Having trouble concentrating.  Feeling irritable, anxious, or depressed.  How is this treated? Treatment for insomnia depends on the cause. If your insomnia is caused by an underlying condition, treatment will focus on addressing the condition. Treatment may also include:  Medicines to help you sleep.  Counseling or therapy.  Lifestyle adjustments.  Follow these instructions at home:  Take medicines only as directed by your health care provider.  Keep regular sleeping and waking hours. Avoid naps.  Keep a sleep diary to help you and your health care provider figure out what could be causing your insomnia. Include: ? When you sleep. ? When you wake up during the night. ? How well you sleep. ? How rested you feel the next day. ? Any side effects of medicines you are taking. ? What you eat and drink.  Make your bedroom a comfortable place where it is easy to fall asleep: ? Put up shades or special blackout curtains to block light from outside. ? Use a white noise machine to block noise. ? Keep the temperature cool.  Exercise regularly as directed by your health care provider. Avoid exercising right before bedtime.  Use relaxation techniques to manage stress. Ask your health care provider to suggest some techniques that may work well for you. These may include: ? Breathing exercises. ? Routines  to release muscle tension. ? Visualizing peaceful scenes.  Cut back on alcohol, caffeinated beverages, and cigarettes, especially close to bedtime. These can disrupt your sleep.  Do not overeat or eat spicy foods right before bedtime. This can lead to digestive discomfort that can make it hard for you to sleep.  Limit screen use before bedtime. This  includes: ? Watching TV. ? Using your smartphone, tablet, and computer.  Stick to a routine. This can help you fall asleep faster. Try to do a quiet activity, brush your teeth, and go to bed at the same time each night.  Get out of bed if you are still awake after 15 minutes of trying to sleep. Keep the lights down, but try reading or doing a quiet activity. When you feel sleepy, go back to bed.  Make sure that you drive carefully. Avoid driving if you feel very sleepy.  Keep all follow-up appointments as directed by your health care provider. This is important. Contact a health care provider if:  You are tired throughout the day or have trouble in your daily routine due to sleepiness.  You continue to have sleep problems or your sleep problems get worse. Get help right away if:  You have serious thoughts about hurting yourself or someone else. This information is not intended to replace advice given to you by your health care provider. Make sure you discuss any questions you have with your health care provider. Document Released: 02/09/2000 Document Revised: 07/14/2015 Document Reviewed: 11/12/2013 Elsevier Interactive Patient Education  [employer].

## 2017-07-06 LAB — HEPATITIS C ANTIBODY
HEP C AB: NONREACTIVE
SIGNAL TO CUT-OFF: 0.01 (ref <1.00)

## 2017-07-13 ENCOUNTER — Other Ambulatory Visit: Payer: Self-pay | Admitting: Internal Medicine

## 2017-07-23 ENCOUNTER — Other Ambulatory Visit: Payer: Self-pay | Admitting: Internal Medicine

## 2017-08-06 DIAGNOSIS — H5203 Hypermetropia, bilateral: Secondary | ICD-10-CM | POA: Diagnosis not present

## 2017-10-31 DIAGNOSIS — R768 Other specified abnormal immunological findings in serum: Secondary | ICD-10-CM | POA: Diagnosis not present

## 2017-10-31 DIAGNOSIS — M1990 Unspecified osteoarthritis, unspecified site: Secondary | ICD-10-CM | POA: Diagnosis not present

## 2017-10-31 DIAGNOSIS — L0390 Cellulitis, unspecified: Secondary | ICD-10-CM | POA: Diagnosis not present

## 2017-10-31 DIAGNOSIS — M109 Gout, unspecified: Secondary | ICD-10-CM | POA: Diagnosis not present

## 2017-10-31 DIAGNOSIS — Z79899 Other long term (current) drug therapy: Secondary | ICD-10-CM | POA: Diagnosis not present

## 2017-10-31 DIAGNOSIS — Z7984 Long term (current) use of oral hypoglycemic drugs: Secondary | ICD-10-CM | POA: Diagnosis not present

## 2017-10-31 DIAGNOSIS — E669 Obesity, unspecified: Secondary | ICD-10-CM | POA: Diagnosis not present

## 2017-11-14 DIAGNOSIS — H33051 Total retinal detachment, right eye: Secondary | ICD-10-CM | POA: Diagnosis not present

## 2017-11-14 DIAGNOSIS — H35412 Lattice degeneration of retina, left eye: Secondary | ICD-10-CM | POA: Diagnosis not present

## 2017-11-14 DIAGNOSIS — H43812 Vitreous degeneration, left eye: Secondary | ICD-10-CM | POA: Diagnosis not present

## 2017-11-17 DIAGNOSIS — H33021 Retinal detachment with multiple breaks, right eye: Secondary | ICD-10-CM | POA: Diagnosis not present

## 2017-11-18 DIAGNOSIS — T85398A Other mechanical complication of other ocular prosthetic devices, implants and grafts, initial encounter: Secondary | ICD-10-CM | POA: Diagnosis not present

## 2017-11-18 DIAGNOSIS — H33021 Retinal detachment with multiple breaks, right eye: Secondary | ICD-10-CM | POA: Diagnosis not present

## 2017-11-25 ENCOUNTER — Ambulatory Visit: Payer: Medicare HMO

## 2018-01-05 ENCOUNTER — Ambulatory Visit: Payer: Medicare HMO

## 2018-01-05 ENCOUNTER — Ambulatory Visit (INDEPENDENT_AMBULATORY_CARE_PROVIDER_SITE_OTHER): Payer: Medicare HMO

## 2018-01-05 DIAGNOSIS — Z23 Encounter for immunization: Secondary | ICD-10-CM

## 2018-01-26 ENCOUNTER — Other Ambulatory Visit: Payer: Self-pay | Admitting: Internal Medicine

## 2018-01-26 NOTE — Telephone Encounter (Signed)
Copied from [place] [id number]. Topic: Quick Communication - Rx Refill/Question >> Jan 26, 2018  2:40 PM Mcneil, Ja-Kwan wrote: Medication: DEXILANT 60 MG capsule, hydrochlorothiazide (MICROZIDE) 12.5 MG capsule, and losartan (COZAAR) 50 MG tablet  Has the patient contacted their pharmacy? yes   Preferred Pharmacy (with phone number or street name): [facility] [place], [place]   [phone] (Phone)  [phone] (Fax)  Agent: Please be advised that RX refills may take up to 3 business days. We ask that you follow-up with your pharmacy.

## 2018-01-27 MED ORDER — LOSARTAN POTASSIUM 50 MG PO TABS: 50.0000 mg | ORAL_TABLET | Freq: Every day | ORAL | 1 refills | Status: DC

## 2018-01-27 MED ORDER — DEXLANSOPRAZOLE 60 MG PO CPDR: 1.0000 | DELAYED_RELEASE_CAPSULE | Freq: Every day | ORAL | 1 refills | Status: DC

## 2018-01-27 MED ORDER — HYDROCHLOROTHIAZIDE 12.5 MG PO CAPS: 12.5000 mg | ORAL_CAPSULE | Freq: Every day | ORAL | 1 refills | Status: DC

## 2018-02-03 DIAGNOSIS — I10 Essential (primary) hypertension: Secondary | ICD-10-CM | POA: Diagnosis not present

## 2018-02-03 DIAGNOSIS — E785 Hyperlipidemia, unspecified: Secondary | ICD-10-CM | POA: Diagnosis not present

## 2018-02-03 DIAGNOSIS — E669 Obesity, unspecified: Secondary | ICD-10-CM | POA: Diagnosis not present

## 2018-02-03 DIAGNOSIS — Z833 Family history of diabetes mellitus: Secondary | ICD-10-CM | POA: Diagnosis not present

## 2018-02-03 DIAGNOSIS — Z6837 Body mass index (BMI) 37.0-37.9, adult: Secondary | ICD-10-CM | POA: Diagnosis not present

## 2018-02-03 DIAGNOSIS — Z8249 Family history of ischemic heart disease and other diseases of the circulatory system: Secondary | ICD-10-CM | POA: Diagnosis not present

## 2018-02-03 DIAGNOSIS — Z888 Allergy status to other drugs, medicaments and biological substances status: Secondary | ICD-10-CM | POA: Diagnosis not present

## 2018-02-03 DIAGNOSIS — M1990 Unspecified osteoarthritis, unspecified site: Secondary | ICD-10-CM | POA: Diagnosis not present

## 2018-02-03 DIAGNOSIS — M109 Gout, unspecified: Secondary | ICD-10-CM | POA: Diagnosis not present

## 2018-02-03 DIAGNOSIS — K219 Gastro-esophageal reflux disease without esophagitis: Secondary | ICD-10-CM | POA: Diagnosis not present

## 2018-02-11 DIAGNOSIS — H35371 Puckering of macula, right eye: Secondary | ICD-10-CM | POA: Diagnosis not present

## 2018-02-11 DIAGNOSIS — T85398D Other mechanical complication of other ocular prosthetic devices, implants and grafts, subsequent encounter: Secondary | ICD-10-CM | POA: Diagnosis not present

## 2018-02-11 DIAGNOSIS — H2513 Age-related nuclear cataract, bilateral: Secondary | ICD-10-CM | POA: Diagnosis not present

## 2018-02-13 DIAGNOSIS — H2511 Age-related nuclear cataract, right eye: Secondary | ICD-10-CM | POA: Diagnosis not present

## 2018-02-13 DIAGNOSIS — H33001 Unspecified retinal detachment with retinal break, right eye: Secondary | ICD-10-CM | POA: Diagnosis not present

## 2018-02-13 DIAGNOSIS — H25013 Cortical age-related cataract, bilateral: Secondary | ICD-10-CM | POA: Diagnosis not present

## 2018-02-13 DIAGNOSIS — H2513 Age-related nuclear cataract, bilateral: Secondary | ICD-10-CM | POA: Diagnosis not present

## 2018-02-23 ENCOUNTER — Other Ambulatory Visit: Payer: Self-pay | Admitting: Internal Medicine

## 2018-03-05 DIAGNOSIS — Z48810 Encounter for surgical aftercare following surgery on the sense organs: Secondary | ICD-10-CM | POA: Diagnosis not present

## 2018-03-05 DIAGNOSIS — H2511 Age-related nuclear cataract, right eye: Secondary | ICD-10-CM | POA: Diagnosis not present

## 2018-03-05 DIAGNOSIS — T85398A Other mechanical complication of other ocular prosthetic devices, implants and grafts, initial encounter: Secondary | ICD-10-CM | POA: Diagnosis not present

## 2018-03-05 DIAGNOSIS — H35371 Puckering of macula, right eye: Secondary | ICD-10-CM | POA: Diagnosis not present

## 2018-03-05 DIAGNOSIS — Z8669 Personal history of other diseases of the nervous system and sense organs: Secondary | ICD-10-CM | POA: Diagnosis not present

## 2018-03-13 DIAGNOSIS — H3581 Retinal edema: Secondary | ICD-10-CM | POA: Diagnosis not present

## 2018-03-13 DIAGNOSIS — H35371 Puckering of macula, right eye: Secondary | ICD-10-CM | POA: Diagnosis not present

## 2018-04-03 DIAGNOSIS — H3581 Retinal edema: Secondary | ICD-10-CM | POA: Diagnosis not present

## 2018-04-03 DIAGNOSIS — H35371 Puckering of macula, right eye: Secondary | ICD-10-CM | POA: Diagnosis not present

## 2018-05-20 DIAGNOSIS — M7551 Bursitis of right shoulder: Secondary | ICD-10-CM | POA: Diagnosis not present

## 2018-05-20 DIAGNOSIS — R768 Other specified abnormal immunological findings in serum: Secondary | ICD-10-CM | POA: Diagnosis not present

## 2018-05-20 DIAGNOSIS — Z79899 Other long term (current) drug therapy: Secondary | ICD-10-CM | POA: Diagnosis not present

## 2018-05-20 DIAGNOSIS — E669 Obesity, unspecified: Secondary | ICD-10-CM | POA: Diagnosis not present

## 2018-05-20 DIAGNOSIS — I10 Essential (primary) hypertension: Secondary | ICD-10-CM | POA: Diagnosis not present

## 2018-05-20 DIAGNOSIS — M25519 Pain in unspecified shoulder: Secondary | ICD-10-CM | POA: Diagnosis not present

## 2018-05-20 DIAGNOSIS — M1990 Unspecified osteoarthritis, unspecified site: Secondary | ICD-10-CM | POA: Diagnosis not present

## 2018-05-20 DIAGNOSIS — M7989 Other specified soft tissue disorders: Secondary | ICD-10-CM | POA: Diagnosis not present

## 2018-05-20 DIAGNOSIS — M109 Gout, unspecified: Secondary | ICD-10-CM | POA: Diagnosis not present

## 2018-05-25 ENCOUNTER — Telehealth: Payer: Self-pay | Admitting: Internal Medicine

## 2018-06-08 NOTE — Telephone Encounter (Unsigned)
Copied from [place] [id number]. Topic: General - Other >> Jun 05, 2018  8:46 AM Carolyn Stare wrote:  Pt left VM  on [facility] appt line and would like a call back to schedule a CPE >> Jun 08, 2018  9:48 AM Djimraou, Earlene Plater T wrote: Called pt lmom for pt to call back so that we can get her scheduled for her CPE. >> Jun 08, 2018  1:24 PM Reyne Dumas L wrote: Pt left return call on [facility].

## 2018-07-26 ENCOUNTER — Other Ambulatory Visit: Payer: Self-pay | Admitting: Internal Medicine

## 2018-08-05 DIAGNOSIS — H524 Presbyopia: Secondary | ICD-10-CM | POA: Diagnosis not present

## 2018-08-05 DIAGNOSIS — H5203 Hypermetropia, bilateral: Secondary | ICD-10-CM | POA: Diagnosis not present

## 2018-08-05 DIAGNOSIS — H52209 Unspecified astigmatism, unspecified eye: Secondary | ICD-10-CM | POA: Diagnosis not present

## 2018-08-13 ENCOUNTER — Other Ambulatory Visit: Payer: Self-pay | Admitting: Internal Medicine

## 2018-08-14 ENCOUNTER — Ambulatory Visit (INDEPENDENT_AMBULATORY_CARE_PROVIDER_SITE_OTHER): Payer: Medicare HMO | Admitting: Internal Medicine

## 2018-08-14 ENCOUNTER — Other Ambulatory Visit: Payer: Self-pay

## 2018-08-14 ENCOUNTER — Encounter: Payer: Self-pay | Admitting: Internal Medicine

## 2018-08-14 ENCOUNTER — Telehealth: Payer: Self-pay

## 2018-08-14 DIAGNOSIS — M1990 Unspecified osteoarthritis, unspecified site: Secondary | ICD-10-CM

## 2018-08-14 DIAGNOSIS — K219 Gastro-esophageal reflux disease without esophagitis: Secondary | ICD-10-CM | POA: Diagnosis not present

## 2018-08-14 DIAGNOSIS — I10 Essential (primary) hypertension: Secondary | ICD-10-CM

## 2018-08-14 DIAGNOSIS — Z79899 Other long term (current) drug therapy: Secondary | ICD-10-CM

## 2018-08-14 DIAGNOSIS — E785 Hyperlipidemia, unspecified: Secondary | ICD-10-CM | POA: Diagnosis not present

## 2018-08-14 DIAGNOSIS — M109 Gout, unspecified: Secondary | ICD-10-CM

## 2018-08-14 DIAGNOSIS — R739 Hyperglycemia, unspecified: Secondary | ICD-10-CM | POA: Diagnosis not present

## 2018-08-14 MED ORDER — DEXILANT 60 MG PO CPDR: 1.0000 | DELAYED_RELEASE_CAPSULE | Freq: Every day | ORAL | 1 refills | Status: DC

## 2018-08-14 NOTE — Telephone Encounter (Signed)
lvm pt needs virtual visit

## 2018-08-14 NOTE — Telephone Encounter (Signed)
lvm to return call pt needs virtual visit for refills

## 2018-08-14 NOTE — Progress Notes (Signed)
Virtual Visit via Telephone Note  I connected with@ on 08/14/18 at 11:30 AM EDT by telephone and verified that I am speaking with the correct person using two identifiers.   I discussed the limitations, risks, security and privacy concerns of performing an evaluation and management service by telephone and the decreased  availability of in person appointments. I also discussed with the patient that there may be a patient responsible charge related to this service. The patient expressed understanding and agreed to proceed.  Location patient: home Location provider: work  office Participants present for the call: patient, provider Patient did not have a visit in the prior 7 days to address this/these issue(s).   History of Present Illness: Cynthia Estrada presents for med evaluation  . Due for yearly cpx but covid restrictions of in office visits    GERD: dexilant seems to help a good bit and needs refill   BP has been good  120/70 range  On meds   Sees rheum and on uloric   No tramadol at this time .   Last labs ? 6 mos ago? Has fu in fall  Had eye surgery whole in retina and detached now better   hld taking zetia no se   Lives alone  Isolating  In covid times  Stress but doing ok    Observations/Objective: Patient sounds cheerful and well on the phone. I do not appreciate any SOB. Speech and thought processing are grossly intact. Patient reported vitals: Lab Results  Component Value Date   WBC 4.5 04/25/2017   HGB 13.9 04/25/2017   HCT 42 04/25/2017   PLT 214 04/25/2017   GLUCOSE 96 12/19/2016   CHOL 254 (H) 07/04/2017   TRIG 185.0 (H) 07/04/2017   HDL 66.40 07/04/2017   LDLDIRECT 171.0 12/20/2015   LDLCALC 151 (H) 07/04/2017   ALT 28 04/25/2017   AST 21 04/25/2017   NA 139 04/25/2017   K 4.2 04/25/2017   CL 102 12/19/2016   CREATININE 1.2 (A) 04/25/2017   BUN 10 04/25/2017   CO2 30 12/19/2016   TSH 1.61 07/04/2017   HGBA1C 6.2 07/04/2017    Assessment  and Plan:   ICD-10-CM   1. Gastroesophageal reflux disease, esophagitis presence not specified  Y78.2 Basic metabolic panel    CBC with Differential/Platelet    Hepatic function panel    Lipid panel    Uric acid  2. Medication management  N56.213 Basic metabolic panel    CBC with Differential/Platelet    Hepatic function panel    Lipid panel    Uric acid  3. Essential hypertension  Y86 Basic metabolic panel    CBC with Differential/Platelet    Hepatic function panel    Lipid panel    Uric acid  4. Hyperlipidemia, unspecified hyperlipidemia type  V78.4 Basic metabolic panel    CBC with Differential/Platelet    Hepatic function panel    Lipid panel    Uric acid  5. Arthritis  O96.29 Basic metabolic panel    CBC with Differential/Platelet    Hepatic function panel    Lipid panel    Uric acid  6. Gout, unspecified cause, unspecified chronicity, unspecified site  B28.4 Basic metabolic panel    CBC with Differential/Platelet    Hepatic function panel    Lipid panel    Uric acid  7. Hyperglycemia  X32.4 Basic metabolic panel    CBC with Differential/Platelet    Hepatic function panel    Lipid panel  Uric acid  Patient Care Team: Junice Fei, Standley Brooking, MD as PCP - General Carlean Purl Ofilia Neas, MD (Gastroenterology) Addison Lank, MD as Referring Physician (Dermatology) Josue Hector, MD (Cardiology) Valinda Party, MD (Rheumatology)    Follow Up Instructions: Will refill meds  And plan fasting labs  Soon    Then keep cpx exam  Scheduled in the fall   99441 5-10 99442 11-20 9443 21-30 I did not refer this patient for an OV in the next 24 hours for this/these issue(s).  I discussed the assessment and treatment plan with the patient. The patient was provided an opportunity to ask questions and all were answered. The patient agreed with the plan and demonstrated an understanding of the instructions.   The patient was advised to call back or seek an in-person evaluation if the  symptoms worsen or if the condition fails to improve as anticipated.  I provided 12 minutes of non-face-to-face time during this encounter.   Shanon Ace, MD

## 2018-08-20 ENCOUNTER — Encounter: Payer: Self-pay | Admitting: Internal Medicine

## 2018-09-08 ENCOUNTER — Encounter: Payer: Self-pay | Admitting: Internal Medicine

## 2018-09-24 ENCOUNTER — Other Ambulatory Visit: Payer: Self-pay

## 2018-09-24 MED ORDER — EZETIMIBE 10 MG PO TABS: 10.0000 mg | ORAL_TABLET | Freq: Every day | ORAL | 0 refills | Status: DC

## 2018-10-06 ENCOUNTER — Ambulatory Visit (AMBULATORY_SURGERY_CENTER): Payer: Self-pay | Admitting: *Deleted

## 2018-10-06 ENCOUNTER — Other Ambulatory Visit: Payer: Self-pay

## 2018-10-06 VITALS — Temp 96.2°F | Ht 66.5 in | Wt 232.0 lb

## 2018-10-06 DIAGNOSIS — Z86010 Personal history of colonic polyps: Secondary | ICD-10-CM

## 2018-10-06 NOTE — Progress Notes (Signed)
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

## 2018-10-15 ENCOUNTER — Encounter: Payer: Self-pay | Admitting: Internal Medicine

## 2018-10-19 ENCOUNTER — Telehealth: Payer: Self-pay

## 2018-10-19 NOTE — Telephone Encounter (Signed)
Pt responded "no" to all screening questions °

## 2018-10-19 NOTE — Telephone Encounter (Signed)
Covid-19 screening questions   Do you now or have you had a fever in the last 14 days?  Do you have any respiratory symptoms of shortness of breath or cough now or in the last 14 days?  Do you have any family members or close contacts with diagnosed or suspected Covid-19 in the past 14 days?  Have you been tested for Covid-19 and found to be positive?      L/m to c/b.

## 2018-10-20 ENCOUNTER — Other Ambulatory Visit: Payer: Self-pay

## 2018-10-20 ENCOUNTER — Encounter: Payer: Self-pay | Admitting: Internal Medicine

## 2018-10-20 ENCOUNTER — Ambulatory Visit (AMBULATORY_SURGERY_CENTER): Payer: Medicare HMO | Admitting: Internal Medicine

## 2018-10-20 VITALS — BP 165/73 | HR 58 | Temp 98.9°F | Resp 13 | Ht 66.0 in | Wt 227.0 lb

## 2018-10-20 DIAGNOSIS — K219 Gastro-esophageal reflux disease without esophagitis: Secondary | ICD-10-CM | POA: Diagnosis not present

## 2018-10-20 DIAGNOSIS — D123 Benign neoplasm of transverse colon: Secondary | ICD-10-CM | POA: Diagnosis not present

## 2018-10-20 DIAGNOSIS — D125 Benign neoplasm of sigmoid colon: Secondary | ICD-10-CM | POA: Diagnosis not present

## 2018-10-20 DIAGNOSIS — I10 Essential (primary) hypertension: Secondary | ICD-10-CM | POA: Diagnosis not present

## 2018-10-20 DIAGNOSIS — D128 Benign neoplasm of rectum: Secondary | ICD-10-CM | POA: Diagnosis not present

## 2018-10-20 DIAGNOSIS — Z860100 Personal history of colon polyps, unspecified: Secondary | ICD-10-CM

## 2018-10-20 DIAGNOSIS — D127 Benign neoplasm of rectosigmoid junction: Secondary | ICD-10-CM | POA: Diagnosis not present

## 2018-10-20 MED ORDER — SODIUM CHLORIDE 0.9 % IV SOLN: 500.0000 mL | Freq: Once | INTRAVENOUS | Status: DC

## 2018-10-20 NOTE — Op Note (Signed)
[facility] Patient Name: Cynthia Estrada Procedure Date: 10/20/2018 11:02 AM MRN: [id number] Endoscopist: Gatha Mayer , MD Age: 73 Referring MD:  Date of Birth: 10-21-1945 Gender: Female Account #: [id number] Procedure:                Colonoscopy Indications:              Surveillance: Personal history of adenomatous                            polyps on last colonoscopy 5 years ago Medicines:                Propofol per Anesthesia, Monitored Anesthesia Care Procedure:                Pre-Anesthesia Assessment:                           - Prior to the procedure, a History and Physical                            was performed, and patient medications and                            allergies were reviewed. The patient's tolerance of                            previous anesthesia was also reviewed. The risks                            and benefits of the procedure and the sedation                            options and risks were discussed with the patient.                            All questions were answered, and informed consent                            was obtained. Prior Anticoagulants: The patient has                            taken no previous anticoagulant or antiplatelet                            agents. ASA Grade Assessment: II - A patient with                            mild systemic disease. After reviewing the risks                            and benefits, the patient was deemed in                            satisfactory condition to undergo the procedure.  After obtaining informed consent, the colonoscope                            was passed under direct vision. Throughout the                            procedure, the patient's blood pressure, pulse, and                            oxygen saturations were monitored continuously. The                            Colonoscope was introduced through the anus and   advanced to the the cecum, identified by                            appendiceal orifice and ileocecal valve. The                            colonoscopy was performed without difficulty. The                            patient tolerated the procedure well. The quality                            of the bowel preparation was good. The bowel                            preparation used was GoLYTELY via split dose                            instruction. The rectum and Ileocolonic anastomsis                            areas were photographed. Scope In: 11:16:24 AM Scope Out: 11:27:55 AM Scope Withdrawal Time: 0 hours 7 minutes 39 seconds  Total Procedure Duration: 0 hours 11 minutes 31 seconds  Findings:                 The perianal and digital rectal examinations were                            normal.                           Three sessile polyps were found in the rectum,                            sigmoid colon and transverse colon. The polyps were                            diminutive in size. These polyps were removed with                            a cold snare. Resection and retrieval were  complete. Verification of patient identification                            for the specimen was done. Estimated blood loss was                            minimal.                           There was evidence of a prior end-to-side                            ileo-colonic anastomosis in the transverse colon.                            This was patent and was characterized by healthy                            appearing mucosa.                           The exam was otherwise without abnormality on                            direct and retroflexion views. Complications:            No immediate complications. Estimated Blood Loss:     Estimated blood loss was minimal. Impression:               - Three diminutive polyps in the rectum, in the                            sigmoid  colon and in the transverse colon, removed                            with a cold snare. Resected and retrieved.                           - Patent end-to-side ileo-colonic anastomosis,                            characterized by healthy appearing mucosa.                           - The examination was otherwise normal on direct                            and retroflexion views. But did see internal                            external hemorrhoids that were mildly inflamed                           - Personal history of colonic polyps.  2011 - 7 cm and 4 cm TV adenomas resected with                            right hemicolectomy                           06/2010 2 7 mm adenomas removed                           09/21/2013 diminutive polyp removed descending colon                            adenoma Recommendation:           - Patient has a contact number available for                            emergencies. The signs and symptoms of potential                            delayed complications were discussed with the                            patient. Return to normal activities tomorrow.                            Written discharge instructions were provided to the                            patient.                           - Resume previous diet.                           - Continue present medications.                           - No recommendation at this time regarding repeat                            colonoscopy due to age. WILL REVIEW PATH AND SEE IF                            RECALL MAKES SENSE Gatha Mayer, MD 10/20/2018 11:36:43 AM This report has been signed electronically.

## 2018-10-20 NOTE — Patient Instructions (Addendum)
I found and removed 3 tiny polyps. Nothing that looks like cancer.  I will let you know pathology results and when/if to have another routine colonoscopy by mail and/or My Chart.  I appreciate the opportunity to care for you. Gatha Mayer, MD, [facility]   Please read handouts provided. Await pathology results. Continue present medications.     YOU HAD AN ENDOSCOPIC PROCEDURE TODAY AT [facility]:   Refer to the procedure report that was given to you for any specific questions about what was found during the examination.  If the procedure report does not answer your questions, please call your gastroenterologist to clarify.  If you requested that your care partner not be given the details of your procedure findings, then the procedure report has been included in a sealed envelope for you to review at your convenience later.  YOU SHOULD EXPECT: Some feelings of bloating in the abdomen. Passage of more gas than usual.  Walking can help get rid of the air that was put into your GI tract during the procedure and reduce the bloating. If you had a lower endoscopy (such as a colonoscopy or flexible sigmoidoscopy) you may notice spotting of blood in your stool or on the toilet paper. If you underwent a bowel prep for your procedure, you may not have a normal bowel movement for a few days.  Please Note:  You might notice some irritation and congestion in your nose or some drainage.  This is from the oxygen used during your procedure.  There is no need for concern and it should clear up in a day or so.  SYMPTOMS TO REPORT IMMEDIATELY:   Following lower endoscopy (colonoscopy or flexible sigmoidoscopy):  Excessive amounts of blood in the stool  Significant tenderness or worsening of abdominal pains  Swelling of the abdomen that is new, acute  Fever of 100F or higher  For urgent or emergent issues, a gastroenterologist can be reached at any hour by calling [phone].   DIET:  We do recommend a small meal at first, but then you may proceed to your regular diet.  Drink plenty of fluids but you should avoid alcoholic beverages for 24 hours.  ACTIVITY:  You should plan to take it easy for the rest of today and you should NOT DRIVE or use heavy machinery until tomorrow (because of the sedation medicines used during the test).    FOLLOW UP: Our staff will call the number listed on your records 48-72 hours following your procedure to check on you and address any questions or concerns that you may have regarding the information given to you following your procedure. If we do not reach you, we will leave a message.  We will attempt to reach you two times.  During this call, we will ask if you have developed any symptoms of COVID 19. If you develop any symptoms (ie: fever, flu-like symptoms, shortness of breath, cough etc.) before then, please call [phone].  If you test positive for Covid 19 in the 2 weeks post procedure, please call and report this information to [place].    If any biopsies were taken you will be contacted by phone or by letter within the next 1-3 weeks.  Please call us at [phone] if you have not heard about the biopsies in 3 weeks.    SIGNATURES/CONFIDENTIALITY: You and/or your care partner have signed paperwork which will be entered into your electronic medical record.  These signatures attest to  the fact that that the information above on your After Visit Summary has been reviewed and is understood.  Full responsibility of the confidentiality of this discharge information lies with you and/or your care-partner.

## 2018-10-20 NOTE — Progress Notes (Signed)
Report to PACU, RN, vss, BBS= Clear.  

## 2018-10-22 ENCOUNTER — Telehealth: Payer: Self-pay

## 2018-10-22 NOTE — Telephone Encounter (Signed)
  Follow up Call-  Call back number 10/20/2018  Post procedure Call Back phone  # [phone]  Permission to leave phone message Yes  Some recent data might be hidden     Patient questions:  Do you have a fever, pain , or abdominal swelling? No. Pain Score  0 *  Have you tolerated food without any problems? Yes.    Have you been able to return to your normal activities? Yes.    Do you have any questions about your discharge instructions: Diet   No. Medications  No. Follow up visit  No.  Do you have questions or concerns about your Care? No.  Actions: * If pain score is 4 or above: No action needed, pain <4.  1. Have you developed a fever since your procedure? no  2.   Have you had an respiratory symptoms (SOB or cough) since your procedure? no  3.   Have you tested positive for COVID 19 since your procedure no  4.   Have you had any family members/close contacts diagnosed with the COVID 19 since your procedure?  no   If yes to any of these questions please route to Joylene John, RN and Alphonsa Gin, Therapist, sports.

## 2018-10-25 ENCOUNTER — Other Ambulatory Visit: Payer: Self-pay | Admitting: Internal Medicine

## 2018-10-26 ENCOUNTER — Encounter: Payer: Self-pay | Admitting: Internal Medicine

## 2018-10-26 NOTE — Progress Notes (Signed)
3 adenomas Recall 2023 and consider repeat colonoscopy

## 2018-11-18 ENCOUNTER — Telehealth: Payer: Self-pay

## 2018-11-18 NOTE — Telephone Encounter (Signed)
lvm for pt to call back to reschedule appt to a day other then Thursday. Crm created

## 2018-11-26 ENCOUNTER — Encounter: Payer: Medicare HMO | Admitting: Internal Medicine

## 2018-12-01 DIAGNOSIS — M109 Gout, unspecified: Secondary | ICD-10-CM | POA: Diagnosis not present

## 2018-12-01 DIAGNOSIS — R768 Other specified abnormal immunological findings in serum: Secondary | ICD-10-CM | POA: Diagnosis not present

## 2018-12-01 DIAGNOSIS — Z23 Encounter for immunization: Secondary | ICD-10-CM | POA: Diagnosis not present

## 2018-12-01 DIAGNOSIS — I10 Essential (primary) hypertension: Secondary | ICD-10-CM | POA: Diagnosis not present

## 2018-12-01 DIAGNOSIS — Z79899 Other long term (current) drug therapy: Secondary | ICD-10-CM | POA: Diagnosis not present

## 2018-12-01 DIAGNOSIS — M1990 Unspecified osteoarthritis, unspecified site: Secondary | ICD-10-CM | POA: Diagnosis not present

## 2018-12-01 DIAGNOSIS — M25519 Pain in unspecified shoulder: Secondary | ICD-10-CM | POA: Diagnosis not present

## 2018-12-01 DIAGNOSIS — E669 Obesity, unspecified: Secondary | ICD-10-CM | POA: Diagnosis not present

## 2018-12-01 DIAGNOSIS — M7551 Bursitis of right shoulder: Secondary | ICD-10-CM | POA: Diagnosis not present

## 2018-12-01 DIAGNOSIS — M7989 Other specified soft tissue disorders: Secondary | ICD-10-CM | POA: Diagnosis not present

## 2018-12-06 LAB — BASIC METABOLIC PANEL
BUN: 11 (ref 4–21)
Creatinine: 1.3 — AB (ref 0.5–1.1)
Glucose: 93
Potassium: 4.2 (ref 3.4–5.3)
Sodium: 141 (ref 137–147)

## 2018-12-06 LAB — CBC AND DIFFERENTIAL
HCT: 42 (ref 36–46)
Hemoglobin: 13.7 (ref 12.0–16.0)
Platelets: 217 (ref 150–399)
WBC: 4.8

## 2018-12-06 LAB — HEPATIC FUNCTION PANEL
ALT: 41 — AB (ref 7–35)
AST: 28 (ref 13–35)
Alkaline Phosphatase: 74 (ref 25–125)
Bilirubin, Total: 0.4

## 2018-12-16 ENCOUNTER — Encounter: Payer: Self-pay | Admitting: Internal Medicine

## 2018-12-24 ENCOUNTER — Other Ambulatory Visit: Payer: Self-pay | Admitting: Internal Medicine

## 2018-12-24 DIAGNOSIS — N6489 Other specified disorders of breast: Secondary | ICD-10-CM

## 2018-12-28 NOTE — Progress Notes (Signed)
Chief Complaint  Patient presents with  . Annual Exam    Wants to talk about results of colonoscopy   . Medication Management  . Hypertension  . Hyperlipidemia    HPI: Cynthia Estrada 73 y.o. comes in today for Preventive Medicare exam/ wellness visit  And med check    bp  Taking med no se reported    Sees dr Dossie Der  Rheum and left thigh  Better  after    syed   Toe night burning  2 weeks.  Off and on  No weakness   ? About colon report had adenomatous polyps   Advised to have 3 year  Repeat .  No sx   On PPI per GI    HLD taking zetia   Health Maintenance  Topic Date Due  . MAMMOGRAM  09/20/2017  . TETANUS/TDAP  10/17/2019  . COLONOSCOPY  10/19/2021  . INFLUENZA VACCINE  Completed  . DEXA SCAN  Completed  . Hepatitis C Screening  Completed  . PNA vac Low Risk Adult  Completed   Health Maintenance Review LIFESTYLE:  Exercise:  Trying  To walk    Tobacco/ETS:   no Alcohol:   no Sugar beverages: ginger ale  Sleep:  Sleep off and on .   2 am  Bathroom .  Drug use: no HH:  1  No pets  stil working  2 clients [employer] per week  One is 90 and demented       Hearing:   Ok rinning in ear  And went away   Vision:  No limitations at present . Last eye check UTDlats year  Right retinal detachment    Doing.   Safety:  Has smoke detector and wears seat belts.   No excess sun exposure. Sees dentist regularly. Delayed denstist  Falls:  no  Memory: Felt to be good  , no concern from her or her family.  Depression: No anhedonia unusual crying or depressive symptoms  Nutrition: Eats well balanced diet; adequate calcium and vitamin D. No swallowing chewing problems.  Injury: no major injuries in the last six months.  Other healthcare providers:  Reviewed today .  Preventive parameters: up-to-date  Reviewed   ADLS:   There are no problems or need for assistance  driving, feeding, obtaining food, dressing, toileting and bathing, managing money using phone. She  is independen  ROS:  GEN/ HEENT: No fever, significant weight changes sweats headaches vision problems hearing changes, CV/ PULM; No chest pain shortness of breath cough, syncope,edema  change in exercise tolerance. GI /GU: No adominal pain, vomiting, change in bowel habits. No blood in the stool. No significant GU symptoms. SKIN/HEME: ,no acute skin rashes suspicious lesions or bleeding. No lymphadenopathy, nodules, masses.  NEURO/ PSYCH:  No neurologic signs such as weakness numbness. No depression anxiety. IMM/ Allergy: No unusual infections.  Allergy .   REST of 12 system review negative except as per HPI   Past Medical History:  Diagnosis Date  . ANXIETY 11/11/2006  . Arthritis   . BACK PAIN, LUMBAR 07/11/2008  . Cataract    right eye removed   . Chest pain 06/12/2010   Ok stress echo has Gi sx also cw gerd.   Marland Kitchen FASTING HYPERGLYCEMIA 01/12/2007  . GERD (gastroesophageal reflux disease)   . History of CT scan of chest   . History of varicella   . HOT FLASHES 04/07/2008  . HYPERLIPIDEMIA 01/12/2007  . HYPERTENSION 11/11/2006  . LEG  CRAMPS 01/12/2009  . Normal stress echocardiogram 2012  . 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 Retinal detachment   . SYNCOPE, HX OF 02/10/2007  . UNSPECIFIED ARTHROPATHY SITE UNSPECIFIED 01/12/2007  . Unspecified vitamin D deficiency 02/10/2007    Family History  Problem Relation Age of Onset  . Hypertension Mother   . Diabetes Mother   . Hypertension Sister   . Hypertension Brother   . Hyperlipidemia Brother   . Colon cancer Neg Hx   . Pancreatic cancer Neg Hx   . Rectal cancer Neg Hx   . Stomach cancer Neg Hx   . Colon polyps Neg Hx     Social History   Socioeconomic History  . Marital status: Single    Spouse name: Not on file  . Number of children: Not on file  . Years of education: Not on file  . Highest education level: Not on file  Occupational History  . Occupation: Retire    Comment:  Catering manager  . Financial resource strain: Not on file  . Food insecurity    Worry: Not on file    Inability: Not on file  . Transportation needs    Medical: Not on file    Non-medical: Not on file  Tobacco Use  . Smoking status: Never Smoker  . Smokeless tobacco: Never Used  Substance and Sexual Activity  . Alcohol use: No  . Drug use: No  . Sexual activity: Not on file  Lifestyle  . Physical activity    Days per week: Not on file    Minutes per session: Not on file  . Stress: Not on file  Relationships  . Social Herbalist on phone: Not on file    Gets together: Not on file    Attends religious service: Not on file    Active member of club or organization: Not on file    Attends meetings of clubs or organizations: Not on file    Relationship status: Not on file  Other Topics Concern  . 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     Outpatient Encounter Medications as of 12/29/2018  Medication Sig  . Ascorbic Acid (VITAMIN C) 500 MG tablet Take 500 mg by mouth daily.    . COD LIVER OIL PO Take by mouth.  . dexlansoprazole (DEXILANT) 60 MG capsule Take 1 capsule (60 mg total) by mouth daily.  Marland Kitchen ezetimibe (ZETIA) 10 MG tablet Take 1 tablet (10 mg total) by mouth daily.  . hydrochlorothiazide (MICROZIDE) 12.5 MG capsule Take 1 capsule by mouth once daily  . losartan (COZAAR) 50 MG tablet Take 1 tablet by mouth once daily  . ULORIC 40 MG tablet Take 1 tablet by mouth daily.    Facility-Administered Encounter Medications as of 12/29/2018  Medication  . technetium tetrofosmin (TC-MYOVIEW) injection [id number] millicurie    EXAM:  BP 126/80 (BP Location: Right Arm, Patient Position: Sitting, Cuff Size: Large)   Pulse 85   Temp 97.7 F (36.5 C) (Temporal)   Ht  5\' 6"  (1.676 m)   Wt 235 lb 9.6 oz (106.9 kg)   SpO2 96%   BMI 38.03 kg/m   Body  mass index is 38.03 kg/m.  Physical Exam: Vital signs reviewed [id number] is a well-developed well-nourished alert cooperative   who appears stated age in no acute distress.  HEENT: normocephalic atraumatic , Eyes: PERRL EOM's full, conjunctiva clear, , Ears: no deformity EAC's clear TMs with normal landmarks. Mouth: clear OP, masked NECK: supple without masses, thyromegaly or bruits. CHEST/PULM:  Clear to auscultation and percussion breath sounds equal no wheeze , rales or rhonchi. No chest wall deformities or tenderness. CV: PMI is nondisplaced, S1 S2 no gallops, murmurs, rubs. Peripheral pulses are full without delay.No JVD .  ABDOMEN: Bowel sounds normal nontender  No guard or rebound, no hepato splenomegal no CVA tenderness.   Extremtities:  No clubbing cyanosis or edema, no acute joint swelling or redness no focal atrophy NEURO:  Oriented x3, cranial nerves 3-12 appear to be intact, no obvious focal weakness,gait within normal limits no abnormal reflexes or asymmetrical SKIN: No acute rashes normal turgor, color, no bruising or petechiae. PSYCH: Oriented, good eye contact, no obvious depression anxiety, cognition and judgment appear normal. LN: no cervical axillary inguinal adenopathy No noted deficits in memory, attention, and speech.   Lab Results  Component Value Date   WBC 4.8 12/06/2018   HGB 13.7 12/06/2018   HCT 42 12/06/2018   PLT 217 12/06/2018   GLUCOSE 96 12/19/2016   CHOL 254 (H) 07/04/2017   TRIG 185.0 (H) 07/04/2017   HDL 66.40 07/04/2017   LDLDIRECT 171.0 12/20/2015   LDLCALC 151 (H) 07/04/2017   ALT 41 (A) 12/06/2018   AST 28 12/06/2018   NA 141 12/06/2018   K 4.2 12/06/2018   CL 102 12/19/2016   CREATININE 1.3 (A) 12/06/2018   BUN 11 12/06/2018   CO2 30 12/19/2016   TSH 1.61 07/04/2017   HGBA1C 6.2 07/04/2017    ASSESSMENT AND PLAN:  Discussed the following  assessment and plan:  Visit for preventive health examination  Medication management  Essential hypertension - Plan: Lipid panel, Hepatic function panel, Basic metabolic panel, Hemoglobin A1c  Hyperlipidemia, unspecified hyperlipidemia type - Plan: Lipid panel, Hepatic function panel, Basic metabolic panel, Hemoglobin A1c  Hyperglycemia - Plan: Lipid panel, Hepatic function panel, Basic metabolic panel, Hemoglobin A1c  Arthritis - Plan: Lipid panel, Hepatic function panel, Basic metabolic panel, Hemoglobin A1c  Renal insufficiency  Statin intolerance  Adenomatous polyp of colon, unspecified part of colon - 3 year colon fu advised   Flu vaccine Disc  weight gain and control  bp control  Etc  Disc colonoscopy report  Patient Care Team: Burnis Medin, MD as PCP - General Carlean Purl Ofilia Neas, MD (Gastroenterology) Addison Lank, MD as Referring Physician (Dermatology) Josue Hector, MD (Cardiology) Valinda Party, MD (Rheumatology)  Patient Instructions  Glad you are doing well.  Track  Your food and drink and sleep to help you to do some healthy wegiht  Loss  Or avoid weight gain.    Will notify you  of labs when available.  If all ok then yearly check  cpx and labs    Preventing Unhealthy Weight Gain, Adult Staying at a healthy weight is important to your overall health. When fat builds up in your body, you may become overweight or obese. Being overweight or obese increases your risk of developing certain health problems, such as heart disease, diabetes, sleeping problems, joint problems, and some types of cancer. Unhealthy weight gain is often the result of making unhealthy food choices or not getting enough exercise. You can make  changes to your lifestyle to prevent obesity and stay as healthy as possible. What nutrition changes can be made?   Eat only as much as your body needs. To do this: ? Pay attention to signs that you are hungry or full. Stop eating as soon as you  feel full. ? If you feel hungry, try drinking water first before eating. Drink enough water so your urine is clear or pale yellow. ? Eat smaller portions. Pay attention to portion sizes when eating out. ? Look at serving sizes on food labels. Most foods contain more than one serving per container. ? Eat the recommended number of calories for your gender and activity level. For most active people, a daily total of 2,000 calories is appropriate. If you are trying to lose weight or are not very active, you may need to eat fewer calories. Talk with your health care provider or a diet and nutrition specialist (dietitian) about how many calories you need each day.  Choose healthy foods, such as: ? Fruits and vegetables. At each meal, try to fill at least half of your plate with fruits and vegetables. ? Whole grains, such as whole-wheat bread, brown rice, and quinoa. ? Lean meats, such as chicken or fish. ? Other healthy proteins, such as beans, eggs, or tofu. ? Healthy fats, such as nuts, seeds, fatty fish, and olive oil. ? Low-fat or fat-free dairy products.  Check food labels, and avoid food and drinks that: ? Are high in calories. ? Have added sugar. ? Are high in sodium. ? Have saturated fats or trans fats.  Cook foods in healthier ways, such as by baking, broiling, or grilling.  Make a meal plan for the week, and shop with a grocery list to help you stay on track with your purchases. Try to avoid going to the grocery store when you are hungry.  When grocery shopping, try to shop around the outside of the store first, where the fresh foods are. Doing this helps you to avoid prepackaged foods, which can be high in sugar, salt (sodium), and fat. What lifestyle changes can be made?   Exercise for 30 or more minutes on 5 or more days each week. Exercising may include brisk walking, yard work, biking, running, swimming, and team sports like basketball and soccer. Ask your health care provider  which exercises are safe for you.  Do muscle-strengthening activities, such as lifting weights or using resistance bands, on 2 or more days a week.  Do not use any products that contain nicotine or tobacco, such as cigarettes and e-cigarettes. If you need help quitting, ask your health care provider.  Limit alcohol intake to no more than 1 drink a day for nonpregnant women and 2 drinks a day for men. One drink equals 12 oz of beer, 5 oz of wine, or 1 oz of hard liquor.  Try to get 7-9 hours of sleep each night. What other changes can be made?  Keep a food and activity journal to keep track of: ? What you ate and how many calories you had. Remember to count the calories in sauces, dressings, and side dishes. ? Whether you were active, and what exercises you did. ? Your calorie, weight, and activity goals.  Check your weight regularly. Track any changes. If you notice you have gained weight, make changes to your diet or activity routine.  Avoid taking weight-loss medicines or supplements. Talk to your health care provider before starting any new medicine or supplement.  Talk to your health care provider before trying any new diet or exercise plan. Why are these changes important? Eating healthy, staying active, and having healthy habits can help you to prevent obesity. Those changes also:  Help you manage stress and emotions.  Help you connect with friends and family.  Improve your self-esteem.  Improve your sleep.  Prevent long-term health problems. What can happen if changes are not made? Being obese or overweight can cause you to develop joint or bone problems, which can make it hard for you to stay active or do activities you enjoy. Being obese or overweight also puts stress on your heart and lungs and can lead to health problems like diabetes, heart disease, and some cancers. Where to find more information Talk with your health care provider or a dietitian about healthy eating  and healthy lifestyle choices. You may also find information from:  U.[place]. Department of Agriculture, MyPlate: FormerBoss.no  American Heart Association: www.heart.org  Centers for Disease Control and Prevention: http://www.wolf.info/ Summary  Staying at a healthy weight is important to your overall health. It helps you to prevent certain diseases and health problems, such as heart disease, diabetes, joint problems, sleep disorders, and some types of cancer.  Being obese or overweight can cause you to develop joint or bone problems, which can make it hard for you to stay active or do activities you enjoy.  You can prevent unhealthy weight gain by eating a healthy diet, exercising regularly, not smoking, limiting alcohol, and getting enough sleep.  Talk with your health care provider or a dietitian for guidance about healthy eating and healthy lifestyle choices. This information is not intended to replace advice given to you by your health care provider. Make sure you discuss any questions you have with your health care provider. Document Released: 02/13/2016 Document Revised: 02/14/2017 Document Reviewed: 03/20/2016 Elsevier Patient Education  2020 [place] Allante Beane M.D.

## 2018-12-29 ENCOUNTER — Encounter: Payer: Self-pay | Admitting: Internal Medicine

## 2018-12-29 ENCOUNTER — Other Ambulatory Visit: Payer: Self-pay

## 2018-12-29 ENCOUNTER — Other Ambulatory Visit: Payer: Self-pay | Admitting: Internal Medicine

## 2018-12-29 ENCOUNTER — Ambulatory Visit (INDEPENDENT_AMBULATORY_CARE_PROVIDER_SITE_OTHER): Payer: Medicare HMO | Admitting: Internal Medicine

## 2018-12-29 VITALS — BP 126/80 | HR 85 | Temp 97.7°F | Ht 66.0 in | Wt 235.6 lb

## 2018-12-29 DIAGNOSIS — D126 Benign neoplasm of colon, unspecified: Secondary | ICD-10-CM | POA: Diagnosis not present

## 2018-12-29 DIAGNOSIS — Z0000 Encounter for general adult medical examination without abnormal findings: Secondary | ICD-10-CM | POA: Diagnosis not present

## 2018-12-29 DIAGNOSIS — M1990 Unspecified osteoarthritis, unspecified site: Secondary | ICD-10-CM | POA: Diagnosis not present

## 2018-12-29 DIAGNOSIS — Z789 Other specified health status: Secondary | ICD-10-CM | POA: Diagnosis not present

## 2018-12-29 DIAGNOSIS — N289 Disorder of kidney and ureter, unspecified: Secondary | ICD-10-CM | POA: Diagnosis not present

## 2018-12-29 DIAGNOSIS — I10 Essential (primary) hypertension: Secondary | ICD-10-CM | POA: Diagnosis not present

## 2018-12-29 DIAGNOSIS — E785 Hyperlipidemia, unspecified: Secondary | ICD-10-CM | POA: Diagnosis not present

## 2018-12-29 DIAGNOSIS — Z79899 Other long term (current) drug therapy: Secondary | ICD-10-CM

## 2018-12-29 DIAGNOSIS — R739 Hyperglycemia, unspecified: Secondary | ICD-10-CM | POA: Diagnosis not present

## 2018-12-29 LAB — HEPATIC FUNCTION PANEL
ALT: 25 U/L (ref 0–35)
AST: 22 U/L (ref 0–37)
Albumin: 4.7 g/dL (ref 3.5–5.2)
Alkaline Phosphatase: 70 U/L (ref 39–117)
Bilirubin, Direct: 0.1 mg/dL (ref 0.0–0.3)
Total Bilirubin: 0.6 mg/dL (ref 0.2–1.2)
Total Protein: 6.6 g/dL (ref 6.0–8.3)

## 2018-12-29 LAB — LIPID PANEL
Cholesterol: 260 mg/dL — ABNORMAL HIGH (ref 0–200)
HDL: 70 mg/dL (ref >39.00)
LDL Cholesterol: 157 mg/dL — ABNORMAL HIGH (ref 0–99)
NonHDL: 189.86
Total CHOL/HDL Ratio: 4
Triglycerides: 165 mg/dL — ABNORMAL HIGH (ref 0.0–149.0)
VLDL: 33 mg/dL (ref 0.0–40.0)

## 2018-12-29 LAB — BASIC METABOLIC PANEL
BUN: 14 mg/dL (ref 6–23)
CO2: 28 mEq/L (ref 19–32)
Calcium: 9.8 mg/dL (ref 8.4–10.5)
Chloride: 103 mEq/L (ref 96–112)
Creatinine, Ser: 1.23 mg/dL — ABNORMAL HIGH (ref 0.40–1.20)
GFR: 51.53 mL/min — ABNORMAL LOW (ref >60.00)
Glucose, Bld: 97 mg/dL (ref 70–99)
Potassium: 4.1 mEq/L (ref 3.5–5.1)
Sodium: 138 mEq/L (ref 135–145)

## 2018-12-29 MED ORDER — EZETIMIBE 10 MG PO TABS: 10.0000 mg | ORAL_TABLET | Freq: Every day | ORAL | 3 refills | Status: DC

## 2018-12-29 NOTE — Patient Instructions (Signed)
Glad you are doing well.  Track  Your food and drink and sleep to help you to do some healthy wegiht  Loss  Or avoid weight gain.    Will notify you  of labs when available.  If all ok then yearly check  cpx and labs    Preventing Unhealthy Weight Gain, Adult Staying at a healthy weight is important to your overall health. When fat builds up in your body, you may become overweight or obese. Being overweight or obese increases your risk of developing certain health problems, such as heart disease, diabetes, sleeping problems, joint problems, and some types of cancer. Unhealthy weight gain is often the result of making unhealthy food choices or not getting enough exercise. You can make changes to your lifestyle to prevent obesity and stay as healthy as possible. What nutrition changes can be made?   Eat only as much as your body needs. To do this: ? Pay attention to signs that you are hungry or full. Stop eating as soon as you feel full. ? If you feel hungry, try drinking water first before eating. Drink enough water so your urine is clear or pale yellow. ? Eat smaller portions. Pay attention to portion sizes when eating out. ? Look at serving sizes on food labels. Most foods contain more than one serving per container. ? Eat the recommended number of calories for your gender and activity level. For most active people, a daily total of 2,000 calories is appropriate. If you are trying to lose weight or are not very active, you may need to eat fewer calories. Talk with your health care provider or a diet and nutrition specialist (dietitian) about how many calories you need each day.  Choose healthy foods, such as: ? Fruits and vegetables. At each meal, try to fill at least half of your plate with fruits and vegetables. ? Whole grains, such as whole-wheat bread, brown rice, and quinoa. ? Lean meats, such as chicken or fish. ? Other healthy proteins, such as beans, eggs, or tofu. ? Healthy fats,  such as nuts, seeds, fatty fish, and olive oil. ? Low-fat or fat-free dairy products.  Check food labels, and avoid food and drinks that: ? Are high in calories. ? Have added sugar. ? Are high in sodium. ? Have saturated fats or trans fats.  Cook foods in healthier ways, such as by baking, broiling, or grilling.  Make a meal plan for the week, and shop with a grocery list to help you stay on track with your purchases. Try to avoid going to the grocery store when you are hungry.  When grocery shopping, try to shop around the outside of the store first, where the fresh foods are. Doing this helps you to avoid prepackaged foods, which can be high in sugar, salt (sodium), and fat. What lifestyle changes can be made?   Exercise for 30 or more minutes on 5 or more days each week. Exercising may include brisk walking, yard work, biking, running, swimming, and team sports like basketball and soccer. Ask your health care provider which exercises are safe for you.  Do muscle-strengthening activities, such as lifting weights or using resistance bands, on 2 or more days a week.  Do not use any products that contain nicotine or tobacco, such as cigarettes and e-cigarettes. If you need help quitting, ask your health care provider.  Limit alcohol intake to no more than 1 drink a day for nonpregnant women and 2 drinks a day  for men. One drink equals 12 oz of beer, 5 oz of wine, or 1 oz of hard liquor.  Try to get 7-9 hours of sleep each night. What other changes can be made?  Keep a food and activity journal to keep track of: ? What you ate and how many calories you had. Remember to count the calories in sauces, dressings, and side dishes. ? Whether you were active, and what exercises you did. ? Your calorie, weight, and activity goals.  Check your weight regularly. Track any changes. If you notice you have gained weight, make changes to your diet or activity routine.  Avoid taking weight-loss  medicines or supplements. Talk to your health care provider before starting any new medicine or supplement.  Talk to your health care provider before trying any new diet or exercise plan. Why are these changes important? Eating healthy, staying active, and having healthy habits can help you to prevent obesity. Those changes also:  Help you manage stress and emotions.  Help you connect with friends and family.  Improve your self-esteem.  Improve your sleep.  Prevent long-term health problems. What can happen if changes are not made? Being obese or overweight can cause you to develop joint or bone problems, which can make it hard for you to stay active or do activities you enjoy. Being obese or overweight also puts stress on your heart and lungs and can lead to health problems like diabetes, heart disease, and some cancers. Where to find more information Talk with your health care provider or a dietitian about healthy eating and healthy lifestyle choices. You may also find information from:  U.[place]. Department of Agriculture, MyPlate: FormerBoss.no  American Heart Association: www.heart.org  Centers for Disease Control and Prevention: http://www.wolf.info/ Summary  Staying at a healthy weight is important to your overall health. It helps you to prevent certain diseases and health problems, such as heart disease, diabetes, joint problems, sleep disorders, and some types of cancer.  Being obese or overweight can cause you to develop joint or bone problems, which can make it hard for you to stay active or do activities you enjoy.  You can prevent unhealthy weight gain by eating a healthy diet, exercising regularly, not smoking, limiting alcohol, and getting enough sleep.  Talk with your health care provider or a dietitian for guidance about healthy eating and healthy lifestyle choices. This information is not intended to replace advice given to you by your health care provider. Make sure you  discuss any questions you have with your health care provider. Document Released: 02/13/2016 Document Revised: 02/14/2017 Document Reviewed: 03/20/2016 Elsevier Patient Education  2020 [employer].

## 2018-12-30 LAB — HEMOGLOBIN A1C: Hgb A1c MFr Bld: 6.1 % (ref 4.6–6.5)

## 2019-01-06 ENCOUNTER — Ambulatory Visit: Payer: Medicare HMO

## 2019-01-06 ENCOUNTER — Ambulatory Visit
Admission: RE | Admit: 2019-01-06 | Discharge: 2019-01-06 | Disposition: A | Discharge status: Home / Self Care | Payer: Medicare HMO | Source: Ambulatory Visit | Attending: Internal Medicine | Admitting: Internal Medicine

## 2019-01-06 ENCOUNTER — Other Ambulatory Visit: Payer: Self-pay

## 2019-01-06 DIAGNOSIS — N6489 Other specified disorders of breast: Secondary | ICD-10-CM

## 2019-01-06 DIAGNOSIS — R922 Inconclusive mammogram: Secondary | ICD-10-CM | POA: Diagnosis not present

## 2019-01-30 ENCOUNTER — Other Ambulatory Visit: Payer: Self-pay | Admitting: Internal Medicine

## 2019-03-10 ENCOUNTER — Other Ambulatory Visit: Payer: Self-pay | Admitting: Internal Medicine

## 2019-05-07 ENCOUNTER — Other Ambulatory Visit: Payer: Self-pay | Admitting: Internal Medicine

## 2019-06-03 DIAGNOSIS — M25519 Pain in unspecified shoulder: Secondary | ICD-10-CM | POA: Diagnosis not present

## 2019-06-03 DIAGNOSIS — I10 Essential (primary) hypertension: Secondary | ICD-10-CM | POA: Diagnosis not present

## 2019-06-03 DIAGNOSIS — R768 Other specified abnormal immunological findings in serum: Secondary | ICD-10-CM | POA: Diagnosis not present

## 2019-06-03 DIAGNOSIS — M109 Gout, unspecified: Secondary | ICD-10-CM | POA: Diagnosis not present

## 2019-06-03 DIAGNOSIS — Z79899 Other long term (current) drug therapy: Secondary | ICD-10-CM | POA: Diagnosis not present

## 2019-06-03 DIAGNOSIS — E669 Obesity, unspecified: Secondary | ICD-10-CM | POA: Diagnosis not present

## 2019-06-03 DIAGNOSIS — M1990 Unspecified osteoarthritis, unspecified site: Secondary | ICD-10-CM | POA: Diagnosis not present

## 2019-06-03 DIAGNOSIS — M7551 Bursitis of right shoulder: Secondary | ICD-10-CM | POA: Diagnosis not present

## 2019-06-09 ENCOUNTER — Other Ambulatory Visit: Payer: Self-pay | Admitting: Internal Medicine

## 2019-06-26 ENCOUNTER — Encounter (HOSPITAL_COMMUNITY): Payer: Self-pay | Admitting: Emergency Medicine

## 2019-06-26 ENCOUNTER — Other Ambulatory Visit: Payer: Self-pay

## 2019-06-26 ENCOUNTER — Emergency Department (HOSPITAL_COMMUNITY)
Admission: EM | Admit: 2019-06-26 | Discharge: 2019-06-26 | Disposition: A | Discharge status: Home / Self Care | Payer: Medicare HMO | Attending: Emergency Medicine | Admitting: Emergency Medicine

## 2019-06-26 ENCOUNTER — Emergency Department (HOSPITAL_COMMUNITY): Payer: Medicare HMO

## 2019-06-26 DIAGNOSIS — Z79899 Other long term (current) drug therapy: Secondary | ICD-10-CM | POA: Diagnosis not present

## 2019-06-26 DIAGNOSIS — R0600 Dyspnea, unspecified: Secondary | ICD-10-CM | POA: Diagnosis not present

## 2019-06-26 DIAGNOSIS — I110 Hypertensive heart disease with heart failure: Secondary | ICD-10-CM | POA: Insufficient documentation

## 2019-06-26 DIAGNOSIS — I509 Heart failure, unspecified: Secondary | ICD-10-CM

## 2019-06-26 DIAGNOSIS — R0602 Shortness of breath: Secondary | ICD-10-CM | POA: Diagnosis present

## 2019-06-26 DIAGNOSIS — R079 Chest pain, unspecified: Secondary | ICD-10-CM | POA: Diagnosis not present

## 2019-06-26 HISTORY — DX: Pure hypercholesterolemia, unspecified: E78.00

## 2019-06-26 LAB — CBC
HCT: 42.2 % (ref 36.0–46.0)
Hemoglobin: 13.5 g/dL (ref 12.0–15.0)
MCH: 26.8 pg (ref 26.0–34.0)
MCHC: 32 g/dL (ref 30.0–36.0)
MCV: 83.7 fL (ref 80.0–100.0)
Platelets: 217 10*3/uL (ref 150–400)
RBC: 5.04 MIL/uL (ref 3.87–5.11)
RDW: 15.8 % — ABNORMAL HIGH (ref 11.5–15.5)
WBC: 5.9 10*3/uL (ref 4.0–10.5)
nRBC: 0 % (ref 0.0–0.2)

## 2019-06-26 LAB — BASIC METABOLIC PANEL
Anion gap: 11 (ref 5–15)
BUN: 8 mg/dL (ref 8–23)
CO2: 25 mmol/L (ref 22–32)
Calcium: 9.6 mg/dL (ref 8.9–10.3)
Chloride: 103 mmol/L (ref 98–111)
Creatinine, Ser: 1.34 mg/dL — ABNORMAL HIGH (ref 0.44–1.00)
GFR calc Af Amer: 45 mL/min — ABNORMAL LOW (ref >60)
GFR calc non Af Amer: 39 mL/min — ABNORMAL LOW (ref >60)
Glucose, Bld: 112 mg/dL — ABNORMAL HIGH (ref 70–99)
Potassium: 3.5 mmol/L (ref 3.5–5.1)
Sodium: 139 mmol/L (ref 135–145)

## 2019-06-26 LAB — TROPONIN I (HIGH SENSITIVITY)
Troponin I (High Sensitivity): 7 ng/L (ref <18)
Troponin I (High Sensitivity): 5 ng/L (ref <18)

## 2019-06-26 MED ORDER — SODIUM CHLORIDE 0.9% FLUSH: 3.0000 mL | Freq: Once | INTRAVENOUS | Status: DC

## 2019-06-26 MED ORDER — FUROSEMIDE 10 MG/ML IJ SOLN
40.0000 mg | Freq: Once | INTRAMUSCULAR | Status: AC
  Administered 2019-06-26: 40 mg via INTRAVENOUS
  Filled 2019-06-26: qty 4

## 2019-06-26 NOTE — ED Triage Notes (Signed)
Pt woke up at 12:30am with tightness to center of chest.  Also reports SOB and nausea.

## 2019-06-26 NOTE — ED Provider Notes (Signed)
[facility] DEPART[facility] Provider Note   CSN: [id number] Arrival date & time: 06/26/19  0747     History No chief complaint on file.   Cynthia Estrada is a 74 y.o. female.  HPI 74 year old female history of hypertension, hypercholesterolemia,, prior history of chest pain with normal stress echo, presents today complaining of feeling like she was short of breath and had to take a deep breath this morning on awakening.  This lasted for short period of time and resolved without intervention.  She denies any fever, cough, history of DVT or PE, nausea, vomiting, fever, or chills.  She denies similar symptoms in the past.  The symptoms have since resolved.    Past Medical History:  Diagnosis Date  . ANXIETY 11/11/2006  . Arthritis   . BACK PAIN, LUMBAR 07/11/2008  . Cataract    right eye removed   . Chest pain 06/12/2010   Ok stress echo has Gi sx also cw gerd.   Marland Kitchen FASTING HYPERGLYCEMIA 01/12/2007  . GERD (gastroesophageal reflux disease)   . High cholesterol   . History of CT scan of chest   . History of varicella   . HOT FLASHES 04/07/2008  . HYPERLIPIDEMIA 01/12/2007  . HYPERTENSION 11/11/2006  . LEG CRAMPS 01/12/2009  . Normal stress echocardiogram 2012  . 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 Retinal detachment   . SYNCOPE, HX OF 02/10/2007  . UNSPECIFIED ARTHROPATHY SITE UNSPECIFIED 01/12/2007  . Unspecified vitamin D deficiency 02/10/2007    Patient Active Problem List   Diagnosis Date Noted  . Body aches 11/01/2014  . Dysuria 11/01/2014  . Visit for preventive health examination 10/29/2013  . Sleep disturbance, unspecified 10/29/2013  . Tingling in extremities 10/29/2013  . Hyperglycemia 10/29/2013  . Other malaise and fatigue 04/28/2012  . Estrogen deficiency 04/28/2012  . Medication side effect 01/02/2012  . Medicare annual wellness visit, subsequent 10/22/2011  . Myalgia 10/22/2011  . Nausea alone  10/22/2011  . GERD (gastroesophageal reflux disease) 04/16/2010  . 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 LEG CRAMPS 01/12/2009  . BACK PAIN, LUMBAR 07/11/2008  . HOT FLASHES 04/07/2008  . UNSPECIFIED VITAMIN D DEFICIENCY 02/10/2007  . SYNCOPE, HX OF 02/10/2007  . HYPERLIPIDEMIA 01/12/2007  . FASTING HYPERGLYCEMIA 01/12/2007  . ANXIETY 11/11/2006  . HYPERTENSION 11/11/2006    Past Surgical History:  Procedure Laterality Date  . ABDOMINAL HYSTERECTOMY     age 22 for fibroids  . CATARACT EXTRACTION Right    done with retinal detachment repair   . COLON SURGERY  08/2009  . COLONOSCOPY  07/01/2009   Two tubulovillous adenoms 7 and 4 cm size  . COLONOSCOPY W/ POLYPECTOMY  07/11/2010   two 7-8 mm polyps removed - adenomas  . hemicollectomy  08/2009   Right for tubulovillous adenomas (2)  . RETINAL DETACHMENT SURGERY       OB History   No obstetric history on file.     Family History  Problem Relation Age of Onset  . Hypertension Mother   . Diabetes Mother   . Hypertension Sister   . Hypertension Brother   . Hyperlipidemia Brother   . Colon cancer Neg Hx   . Pancreatic cancer Neg Hx   . Rectal cancer Neg Hx   . Stomach cancer Neg Hx   . Colon polyps Neg Hx     Social History   Tobacco Use  .  Smoking status: Never Smoker  . Smokeless tobacco: Never Used  Substance Use Topics  . Alcohol use: No  . Drug use: No    Home Medications Prior to Admission medications   Medication Sig Start Date End Date Taking? Authorizing Provider  Ascorbic Acid (VITAMIN C) 500 MG tablet Take 500 mg by mouth daily.     Yes [provider]  DEXILANT 60 MG capsule Take 1 capsule by mouth once daily Patient taking differently: Take 60 mg by mouth daily.  06/09/19  Yes Panosh, Standley Brooking, MD  ezetimibe (ZETIA) 10 MG tablet Take 1 tablet (10 mg total) by mouth daily. 12/29/18  Yes Panosh, Standley Brooking, MD  hydrochlorothiazide (MICROZIDE) 12.5  MG capsule Take 1 capsule by mouth once daily Patient taking differently: Take 12.5 mg by mouth daily.  05/07/19  Yes Panosh, Standley Brooking, MD  losartan (COZAAR) 50 MG tablet Take 1 tablet by mouth once daily Patient taking differently: Take 50 mg by mouth daily.  05/07/19  Yes Panosh, Standley Brooking, MD  ULORIC 40 MG tablet Take 40 mg by mouth daily.  12/10/16  Yes [provider]    Allergies    Simvastatin, Atorvastatin, Lisinopril, and Pravastatin  Review of Systems   Review of Systems  All other systems reviewed and are negative.   Physical Exam Updated Vital Signs BP (!) 156/96 (BP Location: Left Arm)   Pulse 70   Temp 98.5 F (36.9 C) (Oral)   Resp 18   Wt 104.3 kg   SpO2 98%   BMI 37.12 kg/m   Physical Exam Vitals and nursing note reviewed.  Constitutional:      General: She is not in acute distress.    Appearance: She is obese. She is toxic-appearing. She is not ill-appearing or diaphoretic.  HENT:     Head: Normocephalic.     Right Ear: External ear normal.     Left Ear: External ear normal.     Nose: Nose normal.     Mouth/Throat:     Mouth: Mucous membranes are moist.  Eyes:     Extraocular Movements: Extraocular movements intact.     Pupils: Pupils are equal, round, and reactive to light.  Neck:     Vascular: No carotid bruit.  Cardiovascular:     Rate and Rhythm: Normal rate and regular rhythm.     Pulses: Normal pulses.     Heart sounds: Normal heart sounds.  Pulmonary:     Effort: Pulmonary effort is normal.     Breath sounds: Normal breath sounds.  Abdominal:     General: Bowel sounds are normal. There is no distension.     Palpations: Abdomen is soft. There is no mass.     Tenderness: There is no abdominal tenderness. There is no rebound.  Musculoskeletal:        General: No swelling or tenderness. Normal range of motion.     Cervical back: Normal range of motion and neck supple. No tenderness.     Right lower leg: No edema.     Left lower leg:  No edema.  Lymphadenopathy:     Cervical: No cervical adenopathy.  Skin:    General: Skin is warm and dry.     Capillary Refill: Capillary refill takes less than 2 seconds.     Findings: No erythema, lesion or rash.  Neurological:     General: No focal deficit present.     Mental Status: She is alert and oriented to person,  place, and time.     Cranial Nerves: No cranial nerve deficit.     Sensory: No sensory deficit.     Motor: No weakness.     Coordination: Coordination normal.     Deep Tendon Reflexes: Reflexes normal.  Psychiatric:        Mood and Affect: Mood normal.        Behavior: Behavior normal.     ED Results / Procedures / Treatments   Labs (all labs ordered are listed, but only abnormal results are displayed) Labs Reviewed  CBC - Abnormal; Notable for the following components:      Result Value   RDW 15.8 (*)    All other components within normal limits  BASIC METABOLIC PANEL  TROPONIN I (HIGH SENSITIVITY)    EKG EKG Interpretation  Date/Time:  Saturday Jun 26 2019 07:49:48 EDT Ventricular Rate:  67 PR Interval:  136 QRS Duration: 76 QT Interval:  400 QTC Calculation: 422 R Axis:   96 Text Interpretation: Unusual P axis and short PR, probable junctional rhythm Rightward axis Nonspecific ST and T wave abnormality Abnormal ECG Confirmed by Pattricia Boss [phone]) on 06/26/2019 8:40:27 AM   Radiology DG Chest 2 View  Result Date: 06/26/2019 CLINICAL DATA:  Chest pain EXAM: CHEST - 2 VIEW COMPARISON:  2012 FINDINGS: Increased interstitial prominence. Linear atelectasis or scarring at the lung bases. No pleural effusion or pneumothorax. Heart size has increased since 2012. No acute osseous abnormality. IMPRESSION: Nonspecific increased mild interstitial prominence 2012, which could reflect edema. Electronically Signed   By: Macy Mis M.D.   On: 06/26/2019 08:28    Procedures Procedures (including critical care time)  Medications Ordered in  ED Medications  sodium chloride flush (NS) 0.9 % injection 3 mL (has no administration in time range)    ED Course  I have reviewed the triage vital signs and the nursing notes.  Pertinent labs & imaging results that were available during my care of the patient were reviewed by me and considered in my medical decision making (see chart for details).  Clinical Course as of Jun 25 1429  Sat Jun 26, 2019  1429 Chest x-Samyria Rudie reviewed   [DR]    Clinical Course User Index [DR] Pattricia Boss, MD   MDM Rules/Calculators/A&P                      Patient presents today with feeling like she has had difficulty taking a deep breath the symptoms completely resolved.  I reviewed her EKG which shows nonspecific T wave changes from prior although she did have this T wave abnormality present on prior EKG.  Review of records from Dr. Lanna Poche office revealed that she has had an EKG in October 2018 with had some anterolateral T wave inversion and she was seen by him secondary to this.  He performed a gated Myoview.  I reviewed my review of 02/12/2017, however there is no interpretation noted.  Reported that she exercised for 5 minutes 19 seconds to 76% of max.  Reviewing these records, it appears that the EKG changes have been seen previously.  Patient's symptoms are certainly extremely atypical for cardiac ischemia.  They also reveal no significant concern based on history or physical exam for PE.  There is some mild interstitial prominence which was read as possible increased markings consistent with fluid.  She is given Lasix here in the ED doubt other acute intrathoracic abnormality such as dissection, pneumothorax, or infection given  negative chest x-Jaise Moser.  Will review troponin and repeat troponin and plan discharge home if these are normal Troponin and repeat troponin are normal.  Patient feels improved and has remained stable here in ED Final Clinical Impression(s) / ED Diagnoses Final diagnoses:   Dyspnea, unspecified type  Congestive heart failure, unspecified HF chronicity, unspecified heart failure type [facility])    Rx / DC Orders ED Discharge Orders    None       Pattricia Boss, MD 06/26/19 1433

## 2019-06-26 NOTE — Discharge Instructions (Addendum)
EKG is abnormal but unchanged from prior You appear to have some mild fluid on your chest x-Cynthia Estrada.  You are given Lasix here in the emergency department. If you are worse please return the emergency department Please call your doctor on Monday and have follow-up next week

## 2019-06-29 NOTE — Progress Notes (Signed)
This visit occurred during the SARS-CoV-2 public health emergency.  Safety protocols were in place, including screening questions prior to the visit, additional usage of staff PPE, and extensive cleaning of exam room while observing appropriate contact time as indicated for disinfecting solutions.    Chief Complaint  Patient presents with  . Hospitalization Follow-up    Patient stated that she is not as tire today    HPI: Cynthia Estrada 74 y.o. come in for FU ed visit  5 1 2021 for sob with atypical cp  Neg r/o tropinin and felt better   After IV lasix and time       .   Which helped   Stayed with  Sis 2 days.  And today first day feeling herself  ocass gets  swelling  Feet     ocass tingling in legs  ROS: See pertinent positives and negatives per HPI. No dysphagia thinks her gerd is controlled  No fever syncope pleurisy   Past Medical History:  Diagnosis Date  . ANXIETY 11/11/2006  . Arthritis   . BACK PAIN, LUMBAR 07/11/2008  . Cataract    right eye removed   . Chest pain 06/12/2010   Ok stress echo has Gi sx also cw gerd.   Marland Kitchen FASTING HYPERGLYCEMIA 01/12/2007  . GERD (gastroesophageal reflux disease)   . High cholesterol   . History of CT scan of chest   . History of varicella   . HOT FLASHES 04/07/2008  . HYPERLIPIDEMIA 01/12/2007  . HYPERTENSION 11/11/2006  . LEG CRAMPS 01/12/2009  . Normal stress echocardiogram 2012  . 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 Retinal detachment   . SYNCOPE, HX OF 02/10/2007  . UNSPECIFIED ARTHROPATHY SITE UNSPECIFIED 01/12/2007  . Unspecified vitamin D deficiency 02/10/2007    Family History  Problem Relation Age of Onset  . Hypertension Mother   . Diabetes Mother   . Hypertension Sister   . Hypertension Brother   . Hyperlipidemia Brother   . Colon cancer Neg Hx   . Pancreatic cancer Neg Hx   . Rectal cancer Neg Hx   . Stomach cancer Neg Hx   . Colon polyps Neg Hx     Social History    Socioeconomic History  . Marital status: Single    Spouse name: Not on file  . Number of children: Not on file  . Years of education: Not on file  . Highest education level: Not on file  Occupational History  . Occupation: Retire    Comment: [employer]  Tobacco Use  . Smoking status: Never Smoker  . Smokeless tobacco: Never Used  Substance and Sexual Activity  . Alcohol use: No  . Drug use: No  . Sexual activity: Not on file  Other Topics Concern  . 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    Social Determinants of Health   Financial Resource Strain:   . Difficulty of Paying Living Expenses:   Food Insecurity:   . Worried About Charity fundraiser in the Last Year:   . Arboriculturist in the Last Year:   Transportation Needs:   . Film/video editor (Medical):   Marland Kitchen Lack of  Transportation (Non-Medical):   Physical Activity:   . Days of Exercise per Week:   . Minutes of Exercise per Session:   Stress:   . Feeling of Stress :   Social Connections:   . Frequency of Communication with Friends and Family:   . Frequency of Social Gatherings with Friends and Family:   . Attends Religious Services:   . Active Member of Clubs or Organizations:   . Attends Archivist Meetings:   Marland Kitchen Marital Status:     Outpatient Medications Prior to Visit  Medication Sig Dispense Refill  . Ascorbic Acid (VITAMIN C) 500 MG tablet Take 500 mg by mouth daily.      . cholecalciferol (VITAMIN D3) 25 MCG (1000 UNIT) tablet Take 1,000 Units by mouth daily.    . cyanocobalamin 100 MCG tablet Take 100 mcg by mouth as needed.    Marland Kitchen DEXILANT 60 MG capsule Take 1 capsule by mouth once daily (Patient taking differently: Take 60 mg by mouth daily. ) 90 capsule 0  . ezetimibe (ZETIA) 10 MG tablet  Take 1 tablet (10 mg total) by mouth daily. 90 tablet 3  . hydrochlorothiazide (MICROZIDE) 12.5 MG capsule Take 1 capsule by mouth once daily (Patient taking differently: Take 12.5 mg by mouth daily. ) 90 capsule 2  . losartan (COZAAR) 50 MG tablet Take 1 tablet by mouth once daily (Patient taking differently: Take 50 mg by mouth daily. ) 90 tablet 2  . ULORIC 40 MG tablet Take 40 mg by mouth daily.      Facility-Administered Medications Prior to Visit  Medication Dose Route Frequency Provider Last Rate Last Admin  . technetium tetrofosmin (TC-MYOVIEW) injection [id number] millicurie  [id number] millicurie Intravenous Once PRN Fay Records, MD         EXAM:  BP 126/68   Pulse 84   Temp (!) 96.5 F (35.8 C) (Temporal)   Ht 5\' 6"  (1.676 m)   Wt 229 lb 12.8 oz (104.2 kg)   SpO2 98%   BMI 37.09 kg/m   Body mass index is 37.09 kg/m.  GENERAL: vitals reviewed and listed above, alert, oriented, appears well hydrated and in no acute distress HEENT: atraumatic, conjunctiva  clear, no obvious abnormalities on inspection of external nose and ears OP : masked  NECK: no obvious masses on inspection palpation  LUNGS: clear to auscultation bilaterally, no wheezes, rales or rhonchi, good air movement CV: HRRR, no clubbing cyanosis trc peripheral edema nl cap refill  MS: moves all extremities without noticeable focal  abnormality PSYCH: pleasant and cooperative, no obvious depression or anxiety Lab Results  Component Value Date   WBC 5.9 06/26/2019   HGB 13.5 06/26/2019   HCT 42.2 06/26/2019   PLT 217 06/26/2019   GLUCOSE 112 (H) 06/26/2019   CHOL 260 (H) 12/29/2018   TRIG 165.0 (H) 12/29/2018   HDL 70.00 12/29/2018   LDLDIRECT 171.0 12/20/2015   LDLCALC 157 (H) 12/29/2018   ALT 25 12/29/2018   AST 22 12/29/2018   NA 139 06/26/2019   K 3.5 06/26/2019   CL 103 06/26/2019   CREATININE 1.34 (H) 06/26/2019   BUN 8 06/26/2019   CO2 25 06/26/2019   TSH 1.61 07/04/2017   HGBA1C 6.1 12/29/2018    BP Readings from Last 3 Encounters:  06/30/19 126/68  06/26/19 131/78  12/29/18 126/80     ASSESSMENT AND PLAN:  Discussed the following assessment and plan:  Dyspnea, unspecified type - Plan: ECHOCARDIOGRAM COMPLETE, Ambulatory referral to  Cardiology  Chest pressure - Plan: ECHOCARDIOGRAM COMPLETE, Ambulatory referral to Cardiology  Essential hypertension  Renal insufficiency   cxray suspicious for  Fluid inst  And rx with diuretic sx poss  hf  But no hx of same   Doing better  But still very tired  Check echo for lv function and pulm pressures  And cardiology referral   gerd seems controlled   gets flushes  Off and on for  Many years  Fu after cards to reeval  consider gabapentin trial at night?  -Patient advised to return or notify health care team  if  new concerns arise. Outside external source  DATA REVIEWED:   Ed visit  Total time on date  of service including record review ordering and plan of care counsel  30 minutes    Patient Instructions  Your exam is good today but  I want more information and cardiology  Consults and echo cardiogram  Of the heart.   Let us know if problem   In interim.     Echocardiogram An echocardiogram is a procedure that uses painless sound waves (ultrasound) to produce an image of the heart. Images from an echocardiogram can provide important information about:  Signs of coronary artery disease (CAD).  Aneurysm detection. An aneurysm is a weak or damaged part of an artery wall that bulges out from the normal force of blood pumping through the body.  Heart size and shape. Changes in the size or shape of the heart can be associated with certain conditions, including heart failure, aneurysm, and CAD.  Heart muscle function.  Heart valve function.  Signs of a past heart attack.  Fluid buildup around the heart.  Thickening of the heart muscle.  A tumor or infectious growth around the heart valves. Tell a health care provider  about:  Any allergies you have.  All medicines you are taking, including vitamins, herbs, eye drops, creams, and over-the-counter medicines.  Any blood disorders you have.  Any surgeries you have had.  Any medical conditions you have.  Whether you are pregnant or may be pregnant. What are the risks? Generally, this is a safe procedure. However, problems may occur, including:  Allergic reaction to dye (contrast) that may be used during the procedure. What happens before the procedure? No specific preparation is needed. You may eat and drink normally. What happens during the procedure?   An IV tube may be inserted into one of your veins.  You may receive contrast through this tube. A contrast is an injection that improves the quality of the pictures from your heart.  A gel will be applied to your chest.  A wand-like tool (transducer) will be moved over your chest. The gel will help to transmit the sound waves from the transducer.  The sound waves will harmlessly bounce off of your heart to allow the heart images to be captured in real-time motion. The images will be recorded on a computer. The procedure may vary among health care providers and hospitals. What happens after the procedure?  You may return to your normal, everyday life, including diet, activities, and medicines, unless your health care provider tells you not to do that. Summary  An echocardiogram is a procedure that uses painless sound waves (ultrasound) to produce an image of the heart.  Images from an echocardiogram can provide important information about the size and shape of your heart, heart muscle function, heart valve function, and fluid buildup around your heart.  You do  not need to do anything to prepare before this procedure. You may eat and drink normally.  After the echocardiogram is completed, you may return to your normal, everyday life, unless your health care provider tells you not to do  that. This information is not intended to replace advice given to you by your health care provider. Make sure you discuss any questions you have with your health care provider. Document Revised: 06/04/2018 Document Reviewed: 03/16/2016 Elsevier Patient Education  2020 [place] Arther Heisler M.D.

## 2019-06-30 ENCOUNTER — Ambulatory Visit (INDEPENDENT_AMBULATORY_CARE_PROVIDER_SITE_OTHER): Payer: Medicare HMO | Admitting: Internal Medicine

## 2019-06-30 ENCOUNTER — Other Ambulatory Visit: Payer: Self-pay

## 2019-06-30 ENCOUNTER — Encounter: Payer: Self-pay | Admitting: Internal Medicine

## 2019-06-30 VITALS — BP 126/68 | HR 84 | Temp 96.5°F | Ht 66.0 in | Wt 229.8 lb

## 2019-06-30 DIAGNOSIS — I10 Essential (primary) hypertension: Secondary | ICD-10-CM | POA: Diagnosis not present

## 2019-06-30 DIAGNOSIS — N289 Disorder of kidney and ureter, unspecified: Secondary | ICD-10-CM

## 2019-06-30 DIAGNOSIS — R0600 Dyspnea, unspecified: Secondary | ICD-10-CM

## 2019-06-30 DIAGNOSIS — R0789 Other chest pain: Secondary | ICD-10-CM

## 2019-06-30 NOTE — Patient Instructions (Signed)
Your exam is good today but  I want more information and cardiology  Consults and echo cardiogram  Of the heart.   Let us know if problem   In interim.     Echocardiogram An echocardiogram is a procedure that uses painless sound waves (ultrasound) to produce an image of the heart. Images from an echocardiogram can provide important information about:  Signs of coronary artery disease (CAD).  Aneurysm detection. An aneurysm is a weak or damaged part of an artery wall that bulges out from the normal force of blood pumping through the body.  Heart size and shape. Changes in the size or shape of the heart can be associated with certain conditions, including heart failure, aneurysm, and CAD.  Heart muscle function.  Heart valve function.  Signs of a past heart attack.  Fluid buildup around the heart.  Thickening of the heart muscle.  A tumor or infectious growth around the heart valves. Tell a health care provider about:  Any allergies you have.  All medicines you are taking, including vitamins, herbs, eye drops, creams, and over-the-counter medicines.  Any blood disorders you have.  Any surgeries you have had.  Any medical conditions you have.  Whether you are pregnant or may be pregnant. What are the risks? Generally, this is a safe procedure. However, problems may occur, including:  Allergic reaction to dye (contrast) that may be used during the procedure. What happens before the procedure? No specific preparation is needed. You may eat and drink normally. What happens during the procedure?   An IV tube may be inserted into one of your veins.  You may receive contrast through this tube. A contrast is an injection that improves the quality of the pictures from your heart.  A gel will be applied to your chest.  A wand-like tool (transducer) will be moved over your chest. The gel will help to transmit the sound waves from the transducer.  The sound waves will  harmlessly bounce off of your heart to allow the heart images to be captured in real-time motion. The images will be recorded on a computer. The procedure may vary among health care providers and hospitals. What happens after the procedure?  You may return to your normal, everyday life, including diet, activities, and medicines, unless your health care provider tells you not to do that. Summary  An echocardiogram is a procedure that uses painless sound waves (ultrasound) to produce an image of the heart.  Images from an echocardiogram can provide important information about the size and shape of your heart, heart muscle function, heart valve function, and fluid buildup around your heart.  You do not need to do anything to prepare before this procedure. You may eat and drink normally.  After the echocardiogram is completed, you may return to your normal, everyday life, unless your health care provider tells you not to do that. This information is not intended to replace advice given to you by your health care provider. Make sure you discuss any questions you have with your health care provider. Document Revised: 06/04/2018 Document Reviewed: 03/16/2016 Elsevier Patient Education  [place].

## 2019-07-04 NOTE — Progress Notes (Signed)
Cardiology Office Note   Date:  07/05/2019   ID:  Cynthia Estrada, DOB 07-05-1945, MRN [id number]  PCP:  Burnis Medin, MD    No chief complaint on file.  DOE, chest pain  Wt Readings from Last 3 Encounters:  07/05/19 232 lb 6.4 oz (105.4 kg)  06/30/19 229 lb 12.8 oz (104.2 kg)  06/26/19 230 lb (104.3 kg)       History of Present Illness: Cynthia Estrada is a 74 y.o. female who is being seen today for the evaluation of DOE, chest pressure at the request of Panosh, Standley Brooking, MD.  I saw her in December 2018.  At that time:"She has a history of hyperlipidemia, hypertension and renal insufficiency.  She had a routine ECG in October 2018 showing T-wave inversions in the anterior and lateral leads. Prior ECG in 2012 showed some T-wave inversions only in the anterior leads. Given the change, she was referred for further evaluation.   No family h/o heart disease.  She is not a smoker. No significant h/o cigarette smoking.   BP well controlled outside of MDs office.  Readings in the 128/80 range.   She has been statin intolerant.  She tried lipitor and pravastatin but does not recall any other medicine names.  She had muscle aches with both."   In December 2018, she had a stress test showing: "There was no ST segment deviation noted during stress.  The study is normal.  This is a low risk study.   Normal resting and stress perfusion. No ischemia or infarction  No gated images for EF "  In early May 2021, she felt like she could not get a deep breath and had some atypical chest discomfort. It was a tightness in her chest, throat and mouth.   She went to the emergency room.  ECG was unchanged from prior.  She had a negative troponin and she is seen here for follow-up.  She was treated with Lasix IV.   She works as a Building control surveyor.   Since 06/26/19: Denies : Chest pain. Dizziness. Leg edema. Nitroglycerin use. Orthopnea. Palpitations. Paroxysmal nocturnal dyspnea. Shortness of  breath. Syncope.   Past Medical History:  Diagnosis Date  . ANXIETY 11/11/2006  . Arthritis   . BACK PAIN, LUMBAR 07/11/2008  . Cataract    right eye removed   . Chest pain 06/12/2010   Ok stress echo has Gi sx also cw gerd.   Marland Kitchen FASTING HYPERGLYCEMIA 01/12/2007  . GERD (gastroesophageal reflux disease)   . High cholesterol   . History of CT scan of chest   . History of varicella   . HOT FLASHES 04/07/2008  . HYPERLIPIDEMIA 01/12/2007  . HYPERTENSION 11/11/2006  . LEG CRAMPS 01/12/2009  . Normal stress echocardiogram 2012  . 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 Retinal detachment   . SYNCOPE, HX OF 02/10/2007  . UNSPECIFIED ARTHROPATHY SITE UNSPECIFIED 01/12/2007  . Unspecified vitamin D deficiency 02/10/2007    Past Surgical History:  Procedure Laterality Date  . ABDOMINAL HYSTERECTOMY     age 89 for fibroids  . CATARACT EXTRACTION Right    done with retinal detachment repair   . COLON SURGERY  08/2009  . COLONOSCOPY  07/01/2009   Two tubulovillous adenoms 7 and 4 cm size  . COLONOSCOPY W/ POLYPECTOMY  07/11/2010   two 7-8 mm polyps removed - adenomas  . hemicollectomy  08/2009   Right for tubulovillous adenomas (2)  .  RETINAL DETACHMENT SURGERY       Current Outpatient Medications  Medication Sig Dispense Refill  . Ascorbic Acid (VITAMIN C) 500 MG tablet Take 500 mg by mouth daily.      . cholecalciferol (VITAMIN D3) 25 MCG (1000 UNIT) tablet Take 1,000 Units by mouth daily.    . cyanocobalamin 100 MCG tablet Take 100 mcg by mouth as needed.    Marland Kitchen DEXILANT 60 MG capsule Take 1 capsule by mouth once daily (Patient taking differently: Take 60 mg by mouth daily. ) 90 capsule 0  . ezetimibe (ZETIA) 10 MG tablet Take 1 tablet (10 mg total) by mouth daily. 90 tablet 3  . hydrochlorothiazide (MICROZIDE) 12.5 MG capsule Take 1 capsule by mouth once daily (Patient taking differently: Take 12.5 mg by mouth daily. ) 90 capsule 2  . losartan (COZAAR)  50 MG tablet Take 1 tablet by mouth once daily (Patient taking differently: Take 50 mg by mouth daily. ) 90 tablet 2  . ULORIC 40 MG tablet Take 40 mg by mouth daily.      No current facility-administered medications for this visit.   Facility-Administered Medications Ordered in Other Visits  Medication Dose Route Frequency Provider Last Rate Last Admin  . technetium tetrofosmin (TC-MYOVIEW) injection [id number] millicurie  [id number] millicurie Intravenous Once PRN Fay Records, MD        Allergies:   Simvastatin, Atorvastatin, Lisinopril, and Pravastatin    Social History:  The patient  reports that she has never smoked. She has never used smokeless tobacco. She reports that she does not drink alcohol or use drugs.   Family History:  The patient's family history includes Diabetes in her mother; Hyperlipidemia in her brother; Hypertension in her brother, mother, and sister.    ROS:  Please see the history of present illness.   Otherwise, review of systems are positive for recent trip to emergency room.   All other systems are reviewed and negative.    PHYSICAL EXAM: VS:  BP 130/82   Pulse 92   Ht 5\' 6"  (1.676 m)   Wt 232 lb 6.4 oz (105.4 kg)   SpO2 96%   BMI 37.51 kg/m  , BMI Body mass index is 37.51 kg/m. GEN: Well nourished, well developed, in no acute distress  HEENT: normal  Neck: no JVD, carotid bruits, or masses Cardiac: RRR; no murmurs, rubs, or gallops,no edema  Respiratory:  clear to auscultation bilaterally, normal work of breathing GI: soft, nontender, nondistended, + BS MS: no deformity or atrophy  Skin: warm and dry, no rash Neuro:  Strength and sensation are intact Psych: euthymic mood, full affect   EKG:   The ekg ordered 06/26/19 demonstrates NSR, anterior T wave inversion   Recent Labs: 12/29/2018: ALT 25 06/26/2019: BUN 8; Creatinine, Ser 1.34; Hemoglobin 13.5; Platelets 217; Potassium 3.5; Sodium 139   Lipid Panel    Component Value Date/Time   CHOL 260 (H)  12/29/2018 1047   TRIG 165.0 (H) 12/29/2018 1047   TRIG 76 01/15/2006 0809   HDL 70.00 12/29/2018 1047   CHOLHDL 4 12/29/2018 1047   VLDL 33.0 12/29/2018 1047   LDLCALC 157 (H) 12/29/2018 1047   LDLDIRECT 171.0 12/20/2015 0826     Other studies Reviewed: Additional studies/ records that were reviewed today with results demonstrating: ER records reviewed.  Elevated LDL at 157 in 2020.  Minimal vascular congestion on CXR in 5/21.   ASSESSMENT AND PLAN:  1. Dyspnea on exertion/chest discomfort: Some typical and some  atypical features for angina.  She does have multiple risk factors for coronary artery disease and her last evaluation for ischemia was about 2-1/2 years ago.  We discussed coronary CTA to further evaluate for coronary artery disease.  Since she feels well, she wants to hold off.  Low salt diet.  Whole food plant based diet recommended.  If she has more sx, would plan for CTA.  No sign of CHF on exam.  There is likely an aspect of chronic diastolic heart failure related to age and hypertension.  Continue HCTZ.  She appears euvolemic at this point.  Low-salt diet should help. 2. Hypertension: Controlled at home. The current medical regimen is effective;  continue present plan and medications. 3. Hyperlipidemia: She has been statin intolerant.  Continue Zetia.  She seems to be tolerating this.  Calcium scoring may be helpful to see if we should consider other therapy. 4. Prediabetic: A1C 6.1.  Lifestyle changes recommended.   Current medicines are reviewed at length with the patient today.  The patient concerns regarding her medicines were addressed.  The following changes have been made:  No change  Labs/ tests ordered today include:  No orders of the defined types were placed in this encounter.   Recommend 150 minutes/week of aerobic exercise Low fat, low carb, high fiber diet recommended  Disposition:   FU in 6 months   Signed, Larae Grooms, MD  07/05/2019 8:15 AM     [facility] [address], [place],   [address] Phone: [phone]; Fax: [phone]

## 2019-07-05 ENCOUNTER — Other Ambulatory Visit: Payer: Self-pay

## 2019-07-05 ENCOUNTER — Ambulatory Visit: Payer: Medicare HMO | Admitting: Interventional Cardiology

## 2019-07-05 ENCOUNTER — Encounter: Payer: Self-pay | Admitting: Interventional Cardiology

## 2019-07-05 VITALS — BP 130/82 | HR 92 | Ht 66.0 in | Wt 232.4 lb

## 2019-07-05 DIAGNOSIS — E782 Mixed hyperlipidemia: Secondary | ICD-10-CM

## 2019-07-05 DIAGNOSIS — R0609 Other forms of dyspnea: Secondary | ICD-10-CM

## 2019-07-05 DIAGNOSIS — I10 Essential (primary) hypertension: Secondary | ICD-10-CM | POA: Diagnosis not present

## 2019-07-05 DIAGNOSIS — R0600 Dyspnea, unspecified: Secondary | ICD-10-CM | POA: Diagnosis not present

## 2019-07-05 DIAGNOSIS — R072 Precordial pain: Secondary | ICD-10-CM

## 2019-07-05 NOTE — Patient Instructions (Signed)
Medication Instructions:  Your physician recommends that you continue on your current medications as directed. Please refer to the Current Medication list given to you today.  *If you need a refill on your cardiac medications before your next appointment, please call your pharmacy*   Lab Work: None ordered  If you have labs (blood work) drawn today and your tests are completely normal, you will receive your results only by: . MyChart Message (if you have MyChart) OR . A paper copy in the mail If you have any lab test that is abnormal or we need to change your treatment, we will call you to review the results.   Testing/Procedures: None ordered   Follow-Up: At [facility], you and your health needs are our priority.  As part of our continuing mission to provide you with exceptional heart care, we have created designated Provider Care Teams.  These Care Teams include your primary Cardiologist (physician) and Advanced Practice Providers (APPs -  Physician Assistants and Nurse Practitioners) who all work together to provide you with the care you need, when you need it.  We recommend signing up for the patient portal called "MyChart".  Sign up information is provided on this After Visit Summary.  MyChart is used to connect with patients for Virtual Visits (Telemedicine).  Patients are able to view lab/test results, encounter notes, upcoming appointments, etc.  Non-urgent messages can be sent to your provider as well.   To learn more about what you can do with MyChart, go to [email or web address].    Your next appointment:   6 month(s)  The format for your next appointment:   In Person  Provider:   You may see Jayadeep Varanasi, MD or one of the following Advanced Practice Providers on your designated Care Team:    Dayna Dunn, PA-C  Michele Lenze, PA-C    Other Instructions   

## 2019-07-08 DIAGNOSIS — Z008 Encounter for other general examination: Secondary | ICD-10-CM | POA: Diagnosis not present

## 2019-07-08 DIAGNOSIS — E785 Hyperlipidemia, unspecified: Secondary | ICD-10-CM | POA: Diagnosis not present

## 2019-07-08 DIAGNOSIS — H269 Unspecified cataract: Secondary | ICD-10-CM | POA: Diagnosis not present

## 2019-07-08 DIAGNOSIS — Z6837 Body mass index (BMI) 37.0-37.9, adult: Secondary | ICD-10-CM | POA: Diagnosis not present

## 2019-07-08 DIAGNOSIS — M109 Gout, unspecified: Secondary | ICD-10-CM | POA: Diagnosis not present

## 2019-07-08 DIAGNOSIS — I10 Essential (primary) hypertension: Secondary | ICD-10-CM | POA: Diagnosis not present

## 2019-07-08 DIAGNOSIS — K219 Gastro-esophageal reflux disease without esophagitis: Secondary | ICD-10-CM | POA: Diagnosis not present

## 2019-07-08 DIAGNOSIS — E669 Obesity, unspecified: Secondary | ICD-10-CM | POA: Diagnosis not present

## 2019-07-29 ENCOUNTER — Other Ambulatory Visit: Payer: Self-pay

## 2019-07-29 ENCOUNTER — Ambulatory Visit (HOSPITAL_COMMUNITY): Payer: Medicare HMO | Attending: Cardiovascular Disease

## 2019-07-29 DIAGNOSIS — R0789 Other chest pain: Secondary | ICD-10-CM | POA: Diagnosis present

## 2019-07-29 DIAGNOSIS — R0600 Dyspnea, unspecified: Secondary | ICD-10-CM | POA: Diagnosis present

## 2019-08-02 NOTE — Progress Notes (Signed)
Echo tests shows good left heart muscle function at rest and  normal valves    and normal lung pressures .  Please make sure she gets a cardiology appt ( referal was made in May and I dont see one on the schedule)   thanks

## 2019-09-09 ENCOUNTER — Other Ambulatory Visit: Payer: Self-pay | Admitting: Internal Medicine

## 2019-10-04 DIAGNOSIS — R69 Illness, unspecified: Secondary | ICD-10-CM | POA: Diagnosis not present

## 2019-11-28 ENCOUNTER — Telehealth: Payer: Self-pay | Admitting: Internal Medicine

## 2019-11-28 NOTE — Telephone Encounter (Signed)
Can discuss at next visit if appropriate

## 2019-11-28 NOTE — Telephone Encounter (Signed)
-----   Message from Rudean Haskell, [facility] sent at 09/01/2019 11:42 AM EDT ----- Regarding: Dexilant Dr. Shanon Ace,   I am a [facility] pharmacy student intern at [employer] working with [employer] to review claims data for patients with possible cost-savings opportunities.   Per review of chart and payor information, patient is currently prescribed Dexilant for GERD. If clinically appropriate, please consider transition to pantoprazole due to potential for reduced cost to patient and health care system.   Thank you.   Sincerely,  [place] [facility]  Class of 2023  Ralene Bathe, PharmD, [place] [phone]

## 2019-12-08 ENCOUNTER — Other Ambulatory Visit: Payer: Self-pay | Admitting: Internal Medicine

## 2019-12-08 DIAGNOSIS — M79671 Pain in right foot: Secondary | ICD-10-CM | POA: Diagnosis not present

## 2019-12-08 DIAGNOSIS — M19041 Primary osteoarthritis, right hand: Secondary | ICD-10-CM | POA: Diagnosis not present

## 2019-12-08 DIAGNOSIS — Z79899 Other long term (current) drug therapy: Secondary | ICD-10-CM | POA: Diagnosis not present

## 2019-12-08 DIAGNOSIS — I10 Essential (primary) hypertension: Secondary | ICD-10-CM | POA: Diagnosis not present

## 2019-12-08 DIAGNOSIS — M79672 Pain in left foot: Secondary | ICD-10-CM | POA: Diagnosis not present

## 2019-12-08 DIAGNOSIS — M79642 Pain in left hand: Secondary | ICD-10-CM | POA: Diagnosis not present

## 2019-12-08 DIAGNOSIS — Z23 Encounter for immunization: Secondary | ICD-10-CM | POA: Diagnosis not present

## 2019-12-08 DIAGNOSIS — R768 Other specified abnormal immunological findings in serum: Secondary | ICD-10-CM | POA: Diagnosis not present

## 2019-12-08 DIAGNOSIS — M19042 Primary osteoarthritis, left hand: Secondary | ICD-10-CM | POA: Diagnosis not present

## 2019-12-08 DIAGNOSIS — Z1231 Encounter for screening mammogram for malignant neoplasm of breast: Secondary | ICD-10-CM

## 2019-12-08 DIAGNOSIS — M79641 Pain in right hand: Secondary | ICD-10-CM | POA: Diagnosis not present

## 2019-12-08 DIAGNOSIS — E669 Obesity, unspecified: Secondary | ICD-10-CM | POA: Diagnosis not present

## 2019-12-08 DIAGNOSIS — M1990 Unspecified osteoarthritis, unspecified site: Secondary | ICD-10-CM | POA: Diagnosis not present

## 2019-12-08 DIAGNOSIS — M109 Gout, unspecified: Secondary | ICD-10-CM | POA: Diagnosis not present

## 2019-12-09 ENCOUNTER — Telehealth: Payer: Self-pay | Admitting: Internal Medicine

## 2019-12-09 ENCOUNTER — Telehealth: Payer: Self-pay

## 2019-12-09 NOTE — Progress Notes (Signed)
  Chronic Care Management   Outreach Note  12/09/2019 Name: LINZI OHLINGER MRN: [id number] DOB: 01-Aug-1945  Referred by: Burnis Medin, MD Reason for referral : No chief complaint on file.   An unsuccessful telephone outreach was attempted today. The patient was referred to the pharmacist for assistance with care management and care coordination.   Follow Up Plan:   Carley Perdue [employer] Scheduler

## 2019-12-09 NOTE — Telephone Encounter (Signed)
DMV Disability Placard application dropped off for Panosh. Please mail to pt when complete: [address] [place] [address]. Call pt [phone] for questions.  Placed in provider folder.

## 2019-12-13 NOTE — Telephone Encounter (Signed)
Attempted to contact patient. No answer LVM that form was ready for pick up.

## 2019-12-13 NOTE — Telephone Encounter (Signed)
Form completed   On desk

## 2019-12-23 DIAGNOSIS — H52209 Unspecified astigmatism, unspecified eye: Secondary | ICD-10-CM | POA: Diagnosis not present

## 2019-12-23 DIAGNOSIS — H5203 Hypermetropia, bilateral: Secondary | ICD-10-CM | POA: Diagnosis not present

## 2019-12-23 DIAGNOSIS — H524 Presbyopia: Secondary | ICD-10-CM | POA: Diagnosis not present

## 2019-12-27 ENCOUNTER — Telehealth: Payer: Self-pay | Admitting: Internal Medicine

## 2019-12-27 NOTE — Telephone Encounter (Signed)
Left message for patient to call back and schedule Medicare Annual Wellness Visit (AWV) either virtually or in office.  Last AWV 12/25/15; please schedule at anytime with [facility] Nurse Health Advisor 2. OR 1 -was trying to do before cpe on 01/04/20  This should be a 45 minute visit.

## 2019-12-29 NOTE — Telephone Encounter (Signed)
I called patient and offered her a few appointments for AWV patient states she is working on all the times I have given her before her appointment with Dr Regis Bill on 11/9.

## 2019-12-29 NOTE — Telephone Encounter (Signed)
Correction patient called.

## 2020-01-04 ENCOUNTER — Encounter: Payer: Self-pay | Admitting: Internal Medicine

## 2020-01-04 ENCOUNTER — Other Ambulatory Visit: Payer: Self-pay

## 2020-01-04 ENCOUNTER — Ambulatory Visit (INDEPENDENT_AMBULATORY_CARE_PROVIDER_SITE_OTHER): Payer: Medicare HMO | Admitting: Internal Medicine

## 2020-01-04 VITALS — BP 152/88 | HR 78 | Temp 98.5°F | Ht 66.0 in | Wt 236.0 lb

## 2020-01-04 DIAGNOSIS — E785 Hyperlipidemia, unspecified: Secondary | ICD-10-CM | POA: Diagnosis not present

## 2020-01-04 DIAGNOSIS — Z8739 Personal history of other diseases of the musculoskeletal system and connective tissue: Secondary | ICD-10-CM

## 2020-01-04 DIAGNOSIS — I10 Essential (primary) hypertension: Secondary | ICD-10-CM

## 2020-01-04 DIAGNOSIS — Z79899 Other long term (current) drug therapy: Secondary | ICD-10-CM | POA: Diagnosis not present

## 2020-01-04 DIAGNOSIS — M2550 Pain in unspecified joint: Secondary | ICD-10-CM

## 2020-01-04 DIAGNOSIS — Z0000 Encounter for general adult medical examination without abnormal findings: Secondary | ICD-10-CM

## 2020-01-04 DIAGNOSIS — N289 Disorder of kidney and ureter, unspecified: Secondary | ICD-10-CM | POA: Diagnosis not present

## 2020-01-04 NOTE — Progress Notes (Signed)
Chief Complaint  Patient presents with  . Annual Exam    having all over body aches, comes and goes  . Medication Management  . Follow-up    HPI: Cynthia Estrada 74 y.o. comes in today for Preventive Medicare exam/ wellness visit .Since last visit.  About the same except still  having joint pains  All over  And toe feet.   A good bit  Has seen rheum and told she had gout  .   Given  Arms and shoulder     Pains   Gi every day  And some break through   BP  ? Seems ok not checking now   And HLD. Medication GERD  On meds  Daily  Health Maintenance  Topic Date Due  . TETANUS/TDAP  10/17/2019  . COLONOSCOPY  10/19/2021  . INFLUENZA VACCINE  Completed  . DEXA SCAN  Completed  . COVID-19 Vaccine  Completed  . Hepatitis C Screening  Completed  . PNA vac Low Risk Adult  Completed   Health Maintenance Review LIFESTYLE:  Exercise:   Not too much   Tobacco/ETS:n Alcohol: n Sugar beverages:  1 ginger ale.  Sleep: has part time job  And up and down and feet  Toes  Bother her at night.  Bathroom  Drug use: no HH:  1  No pets   98 and 96   Attendant sitting  36 and 40.    Hearing: ok  Vision:  No limitations at present . Last eye check UTD  Safety:  Has smoke detector and wears seat belts.   No excess sun exposure. Sees dentist regularly.  Memory: Felt to be good  , no concern from her or her family.  Depression: No anhedonia unusual crying or depressive symptoms  Nutrition: Eats well balanced diet; adequate calcium and vitamin D. No swallowing chewing problems.  Other healthcare providers:  Reviewed today .  Preventive parameters: up-to-date  Reviewed   ADLS:   There are no problems or need for assistance  driving, feeding, obtaining food, dressing, toileting and bathing, managing money using phone. She is independent.   ROS:  GEN/ HEENT: No fever, significant weight changes sweats headaches vision problems hearing changes, CV/ PULM; No chest pain shortness of breath  cough, syncope,edema  change in exercise tolerance. GI /GU: No adominal pain, vomiting, change in bowel habits. No blood in the stool. No significant GU symptoms. SKIN/HEME: ,no acute skin rashes suspicious lesions or bleeding. No lymphadenopathy, nodules, masses.  NEURO/ PSYCH:  No neurologic signs such as weakness numbness. No depression anxiety. IMM/ Allergy: No unusual infections.  Allergy .   REST of 12 system review negative except as per HPI   Past Medical History:  Diagnosis Date  . ANXIETY 11/11/2006  . Arthritis   . BACK PAIN, LUMBAR 07/11/2008  . Cataract    right eye removed   . Chest pain 06/12/2010   Ok stress echo has Gi sx also cw gerd.   Marland Kitchen FASTING HYPERGLYCEMIA 01/12/2007  . GERD (gastroesophageal reflux disease)   . High cholesterol   . History of CT scan of chest   . History of varicella   . HOT FLASHES 04/07/2008  . HYPERLIPIDEMIA 01/12/2007  . HYPERTENSION 11/11/2006  . LEG CRAMPS 01/12/2009  . Normal stress echocardiogram 2012  . 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 Retinal detachment   . SYNCOPE, HX OF 02/10/2007  . UNSPECIFIED ARTHROPATHY SITE UNSPECIFIED 01/12/2007  . Unspecified  vitamin D deficiency 02/10/2007    Family History  Problem Relation Age of Onset  . Hypertension Mother   . Diabetes Mother   . Hypertension Sister   . Hypertension Brother   . Hyperlipidemia Brother   . Colon cancer Neg Hx   . Pancreatic cancer Neg Hx   . Rectal cancer Neg Hx   . Stomach cancer Neg Hx   . Colon polyps Neg Hx     Social History   Socioeconomic History  . Marital status: Single    Spouse name: Not on file  . Number of children: Not on file  . Years of education: Not on file  . Highest education level: Not on file  Occupational History  . Occupation: Retire    Comment: [employer]  Tobacco Use  . Smoking status: Never Smoker  . Smokeless tobacco: Never Used  Vaping Use  . Vaping Use: Never used  Substance and  Sexual Activity  . Alcohol use: No  . Drug use: No  . Sexual activity: Not on file  Other Topics Concern  . 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    Social Determinants of Health   Financial Resource Strain:   . Difficulty of Paying Living Expenses: Not on file  Food Insecurity:   . Worried About Charity fundraiser in the Last Year: Not on file  . Ran Out of Food in the Last Year: Not on file  Transportation Needs:   . Lack of Transportation (Medical): Not on file  . Lack of Transportation (Non-Medical): Not on file  Physical Activity:   . Days of Exercise per Week: Not on file  . Minutes of Exercise per Session: Not on file  Stress:   . Feeling of Stress : Not on file  Social Connections:   . Frequency of Communication with Friends and Family: Not on file  . Frequency of Social Gatherings with Friends and Family: Not on file  . Attends Religious Services: Not on file  . Active Member of Clubs or Organizations: Not on file  . Attends Archivist Meetings: Not on file  . Marital Status: Not on file    Outpatient Encounter Medications as of 01/04/2020  Medication Sig  . Ascorbic Acid (VITAMIN C) 500 MG tablet Take 500 mg by mouth daily.    . cholecalciferol (VITAMIN D3) 25 MCG (1000 UNIT) tablet Take 1,000 Units by mouth daily.  . cyanocobalamin 100 MCG tablet Take 100 mcg by mouth as needed.  Marland Kitchen DEXILANT 60 MG capsule Take 1 capsule by mouth once daily  . ezetimibe (ZETIA) 10 MG tablet Take 1 tablet (10 mg total) by mouth daily.  . hydrochlorothiazide (MICROZIDE) 12.5 MG capsule Take 1 capsule by mouth once daily (Patient taking differently: Take 12.5 mg by mouth daily. )  . losartan (COZAAR) 50 MG tablet Take 1 tablet by mouth once daily (Patient taking differently: Take  50 mg by mouth daily. )  . ULORIC 40 MG tablet Take 40 mg by mouth daily.    Facility-Administered Encounter Medications as of 01/04/2020  Medication  . technetium tetrofosmin (TC-MYOVIEW) injection 01.7 millicurie    EXAM:  BP (!) 152/88   Pulse 78   Temp  98.5 F (36.9 C) (Oral)   Ht 5\' 6"  (1.676 m)   Wt 236 lb (107 kg)   SpO2 97%   BMI 38.09 kg/m   Body mass index is 38.09 kg/m.  Physical Exam: Vital signs reviewed [id number] is a well-developed well-nourished alert cooperative   who appears stated age in no acute distress.  HEENT: normocephalic atraumatic , Eyes: PERRL EOM's full, conjunctiva clear, Nares: paten,t no deformity discharge or tenderness., glasses Ears: no deformity EAC's clear TMs with normal landmarks. Mouth: cmasked NECK: supple without masses, thyromegaly or bruits. CHEST/PULM:  Clear to auscultation and percussion breath sounds equal no wheeze , rales or rhonchi. No chest wall deformities or tenderness. CV: PMI is nondisplaced, S1 S2 no gallops, murmurs, rubs. Peripheral pulses are full without delay.No JVD . Breast: normal by inspection . No dimpling, discharge, masses, tenderness or discharge . ABDOMEN: Bowel sounds normal nontender  No guard or rebound, no hepato splenomegal no CVA tenderness.   Extremtities:  No clubbing cyanosis or edema, no acute joint swelling or redness no focal atrophy  Arthritis changes  NEURO:  Oriented x3, cranial nerves 3-12 appear to be intact, no obvious focal weakness,gait within normal limits no abnormal reflexes or asymmetrical SKIN: No acute rashes normal turgor, color, no bruising or petechiae. PSYCH: Oriented, good eye contact, no obvious depression anxiety, cognition and judgment appear normal. LN: no cervical axillary inguinal adenopathy No noted deficits in memory, attention, and speech.   Lab Results  Component Value Date   WBC 5.9 06/26/2019   HGB 13.5 06/26/2019   HCT 42.2 06/26/2019   PLT 217 06/26/2019    GLUCOSE 112 (H) 06/26/2019   CHOL 260 (H) 12/29/2018   TRIG 165.0 (H) 12/29/2018   HDL 70.00 12/29/2018   LDLDIRECT 171.0 12/20/2015   LDLCALC 157 (H) 12/29/2018   ALT 25 12/29/2018   AST 22 12/29/2018   NA 139 06/26/2019   K 3.5 06/26/2019   CL 103 06/26/2019   CREATININE 1.34 (H) 06/26/2019   BUN 8 06/26/2019   CO2 25 06/26/2019   TSH 1.61 07/04/2017   HGBA1C 6.1 12/29/2018    ASSESSMENT AND PLAN:  Discussed the following assessment and plan:  Visit for preventive health examination  Essential hypertension - up  in office today  - Plan: Lipid panel, BASIC METABOLIC PANEL WITH GFR, CBC with Differential/Platelet, Hemoglobin A1c, Hepatic function panel, Uric acid, C-reactive protein  Renal insufficiency - Plan: Lipid panel, BASIC METABOLIC PANEL WITH GFR, CBC with Differential/Platelet, Hemoglobin A1c, Hepatic function panel, Uric acid, C-reactive protein  Hyperlipidemia, unspecified hyperlipidemia type - Plan: Lipid panel, BASIC METABOLIC PANEL WITH GFR, CBC with Differential/Platelet, Hemoglobin A1c, Hepatic function panel, Uric acid, C-reactive protein  Medication management - Plan: Lipid panel, BASIC METABOLIC PANEL WITH GFR, CBC with Differential/Platelet, Hemoglobin A1c, Hepatic function panel, Uric acid, C-reactive protein  Multiple joint pain - Plan: Lipid panel, BASIC METABOLIC PANEL WITH GFR, CBC with Differential/Platelet, Hemoglobin A1c, Hepatic function panel, Uric acid, C-reactive protein  History of gout - Plan: Lipid panel, BASIC METABOLIC PANEL WITH GFR, CBC with Differential/Platelet, Hemoglobin A1c, Hepatic function panel, Uric acid, C-reactive protein Reviewed Bp control  Patient Care Team: Cassie Shedlock, Standley Brooking, MD as PCP - General Jettie Booze, MD as PCP - Cardiology (Cardiology) Gatha Mayer, MD (Gastroenterology) Addison Lank, MD as Referring Physician (Dermatology) Josue Hector, MD (Cardiology) Valinda Party, MD (Rheumatology)  Patient  Instructions   Get fasting lab appt .  For blood work .  Get  Booster  After your mammogram   Make sure bp readings are in range  below 140/90 or we may need to  Adjust medication  BP Readings from Last 3 Encounters:  01/04/20 (!) 152/88  07/05/19 130/82  06/30/19 126/68    Losing a few pounds can help BP and joint pains no matter the cause .   I would  Involve rheumatology  In   Opinion for pain control  Many meds can have side effects and need close monitoring for regular use.   ROV in 6- 12 months depending on lab results  And BP  Control.     Standley Brooking. Odelle Kosier M.D.

## 2020-01-04 NOTE — Patient Instructions (Addendum)
Get fasting lab appt .  For blood work .  Get  Booster  After your mammogram   Make sure bp readings are in range  below 140/90 or we may need to  Adjust medication  BP Readings from Last 3 Encounters:  01/04/20 (!) 152/88  07/05/19 130/82  06/30/19 126/68    Losing a few pounds can help BP and joint pains no matter the cause .   I would  Involve rheumatology  In   Opinion for pain control  Many meds can have side effects and need close monitoring for regular use.   ROV in 6- 12 months depending on lab results  And BP  Control.

## 2020-01-09 ENCOUNTER — Other Ambulatory Visit: Payer: Self-pay | Admitting: Internal Medicine

## 2020-01-11 ENCOUNTER — Ambulatory Visit
Admission: RE | Admit: 2020-01-11 | Discharge: 2020-01-11 | Disposition: A | Discharge status: Home / Self Care | Payer: Medicare HMO | Source: Ambulatory Visit | Attending: Internal Medicine | Admitting: Internal Medicine

## 2020-01-11 ENCOUNTER — Other Ambulatory Visit: Payer: Self-pay

## 2020-01-11 DIAGNOSIS — Z1231 Encounter for screening mammogram for malignant neoplasm of breast: Secondary | ICD-10-CM

## 2020-01-26 ENCOUNTER — Other Ambulatory Visit: Payer: Self-pay | Admitting: Internal Medicine

## 2020-01-27 NOTE — Telephone Encounter (Signed)
Can this patient receive a refill of this medication?  Last seen 01/04/2020

## 2020-01-28 NOTE — Telephone Encounter (Signed)
Arrange her fasting lab appt and then can refill for 90 days( orders are already in system)

## 2020-02-02 ENCOUNTER — Other Ambulatory Visit: Payer: Self-pay

## 2020-02-02 ENCOUNTER — Other Ambulatory Visit: Payer: Medicare HMO

## 2020-02-02 DIAGNOSIS — M2550 Pain in unspecified joint: Secondary | ICD-10-CM

## 2020-02-02 DIAGNOSIS — N289 Disorder of kidney and ureter, unspecified: Secondary | ICD-10-CM

## 2020-02-02 DIAGNOSIS — Z8739 Personal history of other diseases of the musculoskeletal system and connective tissue: Secondary | ICD-10-CM | POA: Diagnosis not present

## 2020-02-02 DIAGNOSIS — I10 Essential (primary) hypertension: Secondary | ICD-10-CM | POA: Diagnosis not present

## 2020-02-02 DIAGNOSIS — Z79899 Other long term (current) drug therapy: Secondary | ICD-10-CM

## 2020-02-02 DIAGNOSIS — E785 Hyperlipidemia, unspecified: Secondary | ICD-10-CM | POA: Diagnosis not present

## 2020-02-03 LAB — BASIC METABOLIC PANEL WITH GFR
BUN/Creatinine Ratio: 9 (calc) (ref 6–22)
BUN: 11 mg/dL (ref 7–25)
CO2: 29 mmol/L (ref 20–32)
Calcium: 9.8 mg/dL (ref 8.6–10.4)
Chloride: 101 mmol/L (ref 98–110)
Creat: 1.23 mg/dL — ABNORMAL HIGH (ref 0.60–0.93)
GFR, Est African American: 50 mL/min/{1.73_m2} — ABNORMAL LOW (ref >=60)
GFR, Est Non African American: 43 mL/min/{1.73_m2} — ABNORMAL LOW (ref >=60)
Glucose, Bld: 99 mg/dL (ref 65–99)
Potassium: 4.2 mmol/L (ref 3.5–5.3)
Sodium: 138 mmol/L (ref 135–146)

## 2020-02-03 LAB — LIPID PANEL
Cholesterol: 264 mg/dL — ABNORMAL HIGH (ref <200)
HDL: 75 mg/dL (ref >=50)
LDL Cholesterol (Calc): 160 mg/dL (calc) — ABNORMAL HIGH
Non-HDL Cholesterol (Calc): 189 mg/dL (calc) — ABNORMAL HIGH (ref <130)
Total CHOL/HDL Ratio: 3.5 (calc) (ref <5.0)
Triglycerides: 154 mg/dL — ABNORMAL HIGH (ref <150)

## 2020-02-03 LAB — CBC WITH DIFFERENTIAL/PLATELET
Absolute Monocytes: 585 cells/uL (ref 200–950)
Basophils Absolute: 20 cells/uL (ref 0–200)
Basophils Relative: 0.5 %
Eosinophils Absolute: 70 cells/uL (ref 15–500)
Eosinophils Relative: 1.8 %
HCT: 40.2 % (ref 35.0–45.0)
Hemoglobin: 13.2 g/dL (ref 11.7–15.5)
Lymphs Abs: 1439 cells/uL (ref 850–3900)
MCH: 27.2 pg (ref 27.0–33.0)
MCHC: 32.8 g/dL (ref 32.0–36.0)
MCV: 82.9 fL (ref 80.0–100.0)
MPV: 12.5 fL (ref 7.5–12.5)
Monocytes Relative: 15 %
Neutro Abs: 1786 cells/uL (ref 1500–7800)
Neutrophils Relative %: 45.8 %
Platelets: 194 10*3/uL (ref 140–400)
RBC: 4.85 10*6/uL (ref 3.80–5.10)
RDW: 14.3 % (ref 11.0–15.0)
Total Lymphocyte: 36.9 %
WBC: 3.9 10*3/uL (ref 3.8–10.8)

## 2020-02-03 LAB — URIC ACID: Uric Acid, Serum: 4.6 mg/dL (ref 2.5–7.0)

## 2020-02-03 LAB — HEMOGLOBIN A1C
Hgb A1c MFr Bld: 5.9 % of total Hgb — ABNORMAL HIGH (ref <5.7)
Mean Plasma Glucose: 123 mg/dL
eAG (mmol/L): 6.8 mmol/L

## 2020-02-03 LAB — HEPATIC FUNCTION PANEL
AG Ratio: 2.3 (calc) (ref 1.0–2.5)
ALT: 21 U/L (ref 6–29)
AST: 18 U/L (ref 10–35)
Albumin: 4.5 g/dL (ref 3.6–5.1)
Alkaline phosphatase (APISO): 64 U/L (ref 37–153)
Bilirubin, Direct: 0.2 mg/dL (ref 0.0–0.2)
Globulin: 2 g/dL (calc) (ref 1.9–3.7)
Indirect Bilirubin: 0.7 mg/dL (calc) (ref 0.2–1.2)
Total Bilirubin: 0.9 mg/dL (ref 0.2–1.2)
Total Protein: 6.5 g/dL (ref 6.1–8.1)

## 2020-02-03 LAB — C-REACTIVE PROTEIN: CRP: 4.3 mg/L (ref <8.0)

## 2020-02-05 ENCOUNTER — Other Ambulatory Visit: Payer: Self-pay | Admitting: Internal Medicine

## 2020-02-11 ENCOUNTER — Telehealth: Payer: Self-pay | Admitting: Internal Medicine

## 2020-02-11 NOTE — Progress Notes (Signed)
°  Chronic Care Management   Outreach Note  02/11/2020 Name: KARMEL PATRICELLI MRN: [id number] DOB: 1945/03/02  Referred by: Burnis Medin, MD Reason for referral : No chief complaint on file.   A second unsuccessful telephone outreach was attempted today. The patient was referred to pharmacist for assistance with care management and care coordination.  Follow Up Plan:   Carley Perdue [employer] Scheduler

## 2020-02-23 NOTE — Progress Notes (Signed)
Blood work results are stable or normal except cholesterol is higher than in the past. Apology for l delayed messaging    Keep working o healthy eating lifestyle and take medication.  And we can repeat this at your next visit. Please make sure your blood pressure readings are at goal if not set up a follow-up with [place].

## 2020-02-28 ENCOUNTER — Other Ambulatory Visit: Payer: Self-pay | Admitting: Internal Medicine

## 2020-04-05 ENCOUNTER — Telehealth: Payer: Self-pay | Admitting: Internal Medicine

## 2020-04-05 NOTE — Telephone Encounter (Signed)
Left message for patient to call back and schedule Medicare Annual Wellness Visit (AWV) either virtually or in office. Did not leave detailed message   Last AWV 12/25/15 please schedule at anytime with [facility] Nurse Health Advisor 1 or 2   This should be a 45 minute visit.

## 2020-04-21 ENCOUNTER — Telehealth: Payer: Self-pay | Admitting: Internal Medicine

## 2020-04-21 NOTE — Progress Notes (Signed)
  Chronic Care Management   Outreach Note  04/21/2020 Name: [name] MRN: [id number] DOB: 09/13/1945  Referred by: Burnis Medin, MD Reason for referral : No chief complaint on file.   Third unsuccessful telephone outreach was attempted today. The patient was referred to the pharmacist for assistance with care management and care coordination.   Follow Up Plan:   Carley Perdue [employer] Scheduler

## 2020-04-26 NOTE — Progress Notes (Signed)
Subjective:   Cynthia Estrada is a 75 y.o. female who presents for Medicare Annual (Subsequent) preventive examination.  Review of Systems    N/A  Cardiac Risk Factors include: advanced age (>64men, >72 women);hypertension;dyslipidemia     Objective:    Today's Vitals   04/27/20 0823  BP: 120/80  Pulse: 68  Temp: 98.9 F (37.2 C)  TempSrc: Oral  SpO2: 98%  Weight: 237 lb 6 oz (107.7 kg)   Body mass index is 38.31 kg/m.  Advanced Directives 04/27/2020 09/07/2013  Does Patient Have a Medical Advance Directive? No Patient does not have advance directive  Would patient like information on creating a medical advance directive? Yes (MAU/Ambulatory/Procedural Areas - Information given) -  Pre-existing out of facility DNR order (yellow form or pink MOST form) - No    Current Medications (verified) Outpatient Encounter Medications as of 04/27/2020  Medication Sig  . Ascorbic Acid (VITAMIN C) 500 MG tablet Take 500 mg by mouth daily.  . cholecalciferol (VITAMIN D3) 25 MCG (1000 UNIT) tablet Take 1,000 Units by mouth daily.  . cyanocobalamin 100 MCG tablet Take 100 mcg by mouth as needed.  Marland Kitchen DEXILANT 60 MG capsule Take 1 capsule by mouth once daily  . ezetimibe (ZETIA) 10 MG tablet Take 1 tablet by mouth once daily  . hydrochlorothiazide (MICROZIDE) 12.5 MG capsule Take 1 capsule by mouth once daily  . losartan (COZAAR) 50 MG tablet Take 1 tablet by mouth once daily  . ULORIC 40 MG tablet Take 40 mg by mouth daily.    Facility-Administered Encounter Medications as of 04/27/2020  Medication  . technetium tetrofosmin (TC-MYOVIEW) injection 76.7 millicurie    Allergies (verified) Simvastatin, Atorvastatin, Lisinopril, and Pravastatin   History: Past Medical History:  Diagnosis Date  . ANXIETY 11/11/2006  . Arthritis   . BACK PAIN, LUMBAR 07/11/2008  . Cataract    right eye removed   . Chest pain 06/12/2010   Ok stress echo has Gi sx also cw gerd.   Marland Kitchen FASTING HYPERGLYCEMIA  01/12/2007  . GERD (gastroesophageal reflux disease)   . High cholesterol   . History of CT scan of chest   . History of varicella   . HOT FLASHES 04/07/2008  . HYPERLIPIDEMIA 01/12/2007  . HYPERTENSION 11/11/2006  . LEG CRAMPS 01/12/2009  . Normal stress echocardiogram 2012  . 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 Retinal detachment   . SYNCOPE, HX OF 02/10/2007  . UNSPECIFIED ARTHROPATHY SITE UNSPECIFIED 01/12/2007  . Unspecified vitamin D deficiency 02/10/2007   Past Surgical History:  Procedure Laterality Date  . ABDOMINAL HYSTERECTOMY     age 22 for fibroids  . CATARACT EXTRACTION Right    done with retinal detachment repair   . COLON SURGERY  08/2009  . COLONOSCOPY  07/01/2009   Two tubulovillous adenoms 7 and 4 cm size  . COLONOSCOPY W/ POLYPECTOMY  07/11/2010   two 7-8 mm polyps removed - adenomas  . hemicollectomy  08/2009   Right for tubulovillous adenomas (2)  . RETINAL DETACHMENT SURGERY     Family History  Problem Relation Age of Onset  . Hypertension Mother   . Diabetes Mother   . Hypertension Sister   . Hypertension Brother   . Hyperlipidemia Brother   . Colon cancer Neg Hx   . Pancreatic cancer Neg Hx   . Rectal cancer Neg Hx   . Stomach cancer Neg Hx   . Colon polyps Neg Hx  Social History   Socioeconomic History  . Marital status: Single    Spouse name: Not on file  . Number of children: Not on file  . Years of education: Not on file  . Highest education level: Not on file  Occupational History  . Occupation: Retire    Comment: [employer]  Tobacco Use  . Smoking status: Never Smoker  . Smokeless tobacco: Never Used  Vaping Use  . Vaping Use: Never used  Substance and Sexual Activity  . Alcohol use: No  . Drug use: No  . Sexual activity: Not on file  Other Topics Concern  . Not on file  Social History Narrative   HH of 1    No pets   Retired from [employer] walking  onlylimited by knee  oa changes    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    Social Determinants of Health   Financial Resource Strain: Low Risk   . Difficulty of Paying Living Expenses: Not hard at all  Food Insecurity: No Food Insecurity  . Worried About Charity fundraiser in the Last Year: Never true  . Ran Out of Food in the Last Year: Never true  Transportation Needs: No Transportation Needs  . Lack of Transportation (Medical): No  . Lack of Transportation (Non-Medical): No  Physical Activity: Inactive  . Days of Exercise per Week: 0 days  . Minutes of Exercise per Session: 0 min  Stress: No Stress Concern Present  . Feeling of Stress : Not at all  Social Connections: Socially Isolated  . Frequency of Communication with Friends and Family: More than three times a week  . Frequency of Social Gatherings with Friends and Family: Twice a week  . Attends Religious Services: Never  . Active Member of Clubs or Organizations: No  . Attends Archivist Meetings: Never  . Marital Status: Never married    Tobacco Counseling Counseling given: Not Answered   Clinical Intake:  Pre-visit preparation completed: Yes  Pain : No/denies pain     Nutritional Risks: None Diabetes: No  How often do you need to have someone help you when you read instructions, pamphlets, or other written materials from your doctor or pharmacy?: 1 - Never  Diabetic?No   Interpreter Needed?: No  Information entered by :: [place] of Daily Living In your present state of health, do you have any difficulty performing the following activities: 04/27/2020  Hearing? N  Vision? N  Difficulty concentrating or making decisions? N  Walking or climbing stairs? N  Dressing or bathing? N  Doing errands, shopping? N  Preparing Food and eating ? N  Using the Toilet? N  In the past six months, have you accidently leaked urine? N  Do you have problems with  loss of bowel control? N  Managing your Medications? N  Managing your Finances? N  Housekeeping or managing your Housekeeping? N  Some recent data might be hidden    Patient Care Team: Panosh, Standley Brooking, MD as PCP - General Jettie Booze, MD as PCP - Cardiology (Cardiology) Gatha Mayer, MD (Gastroenterology) Addison Lank, MD as Referring Physician (Dermatology) Josue Hector, MD (Cardiology) Valinda Party, MD (Rheumatology)  Indicate any recent Medical Services you may have received from other than [facility] providers in the past year (date may be approximate).     Assessment:   This is  a routine wellness examination for Velvet.  Hearing/Vision screen  Hearing Screening   125Hz  250Hz  500Hz  1000Hz  2000Hz  3000Hz  4000Hz  6000Hz  8000Hz   Right ear:           Left ear:           Vision Screening Comments: Gets eyes exams yearly. Wears glasses   Dietary issues and exercise activities discussed: Current Exercise Habits: The patient does not participate in regular exercise at present  Goals    . Patient Stated     I want to be able to retire completely       Depression Screen PHQ 2/9 Scores 04/27/2020 01/04/2020 07/04/2017 12/25/2015 11/01/2014 10/29/2013 10/27/2012  PHQ - 2 Score 0 0 0 0 0 0 0    Fall Risk Fall Risk  04/27/2020 01/04/2020 07/04/2017 12/25/2015 11/01/2014  Falls in the past year? 0 0 No No No  Number falls in past yr: 0 0 - - -  Injury with Fall? 0 0 - - -  Risk for fall due to : No Fall Risks - - - -  Follow up Falls evaluation completed;Falls prevention discussed - - - -    FALL RISK PREVENTION PERTAINING TO THE HOME:  Any stairs in or around the home? No  If so, are there any without handrails? No  Home free of loose throw rugs in walkways, pet beds, electrical cords, etc? Yes  Adequate lighting in your home to reduce risk of falls? Yes   ASSISTIVE DEVICES UTILIZED TO PREVENT FALLS:  Life alert? No  Use of a cane, walker or w/c? No  Grab bars in the  bathroom? No  Shower chair or bench in shower? No  Elevated toilet seat or a handicapped toilet? No   TIMED UP AND GO:  Was the test performed? Yes .  Length of time to ambulate 10 feet: 4 sec.   Gait steady and fast without use of assistive device  Cognitive Function:   Normal cognitive status assessed by direct observation by this Nurse Health Advisor. No abnormalities found.        Immunizations Immunization History  Administered Date(s) Administered  . Fluad Quad(high Dose 65+) 12/02/2019  . Influenza Split 01/02/2012  . Influenza Whole 01/12/2007, 12/02/2007, 01/12/2009  . Influenza, High Dose Seasonal PF 11/01/2014, 12/25/2015, 12/19/2016, 01/05/2018  . Influenza, Quadrivalent, Recombinant, Inj, Pf 12/01/2018, 12/08/2019  . Influenza,inj,Quad PF,6+ Mos 10/27/2012, 12/02/2013  . PFIZER(Purple Top)SARS-COV-2 Vaccination 03/09/2019, 03/30/2019  . Pneumococcal Conjugate-13 10/29/2013  . Pneumococcal Polysaccharide-23 10/16/2009  . Td 10/16/2009  . Zoster 10/22/2011    TDAP status: Due, Education has been provided regarding the importance of this vaccine. Advised may receive this vaccine at local pharmacy or Health Dept. Aware to provide a copy of the vaccination record if obtained from local pharmacy or Health Dept. Verbalized acceptance and understanding.  Flu Vaccine status: Up to date  Pneumococcal vaccine status: Up to date  Covid-19 vaccine status: Completed vaccines  Qualifies for Shingles Vaccine? Yes   Zostavax completed Yes   Shingrix Completed?: No.    Education has been provided regarding the importance of this vaccine. Patient has been advised to call insurance company to determine out of pocket expense if they have not yet received this vaccine. Advised may also receive vaccine at local pharmacy or Health Dept. Verbalized acceptance and understanding.  Screening Tests Health Maintenance  Topic Date Due  . COVID-19 Vaccine (3 - Booster for Pfizer series)  09/27/2019  . TETANUS/TDAP  10/17/2019  .  MAMMOGRAM  01/10/2021  . COLONOSCOPY (Pts 45-79yrs Insurance coverage will need to be confirmed)  10/19/2021  . INFLUENZA VACCINE  Completed  . DEXA SCAN  Completed  . Hepatitis C Screening  Completed  . PNA vac Low Risk Adult  Completed  . HPV VACCINES  Aged Out    Health Maintenance  Health Maintenance Due  Topic Date Due  . COVID-19 Vaccine (3 - Booster for Pfizer series) 09/27/2019  . TETANUS/TDAP  10/17/2019    Colorectal cancer screening: Type of screening: Colonoscopy. Completed 10/20/2018. Repeat every 3 years  Mammogram status: Completed 01/11/2020. Repeat every year  Bone Density status: Completed 09/08/2012. Results reflect: Bone density results: NORMAL. Repeat every 5 years.  Lung Cancer Screening: (Low Dose CT Chest recommended if Age 54-80 years, 30 pack-year currently smoking OR have quit w/in 15years.) does not qualify.   Lung Cancer Screening Referral: N/A   Additional Screening:  Hepatitis C Screening: does qualify; Completed 07/04/2017  Vision Screening: Recommended annual ophthalmology exams for early detection of glaucoma and other disorders of the eye. Is the patient up to date with their annual eye exam?  Yes  Who is the provider or what is the name of the office in which the patient attends annual eye exams? Dr. Leighton Ruff  If pt is not established with a provider, would they like to be referred to a provider to establish care? No .   Dental Screening: Recommended annual dental exams for proper oral hygiene  Community Resource Referral / Chronic Care Management: CRR required this visit?  No   CCM required this visit?  No      Plan:     I have personally reviewed and noted the following in the patient's chart:   . Medical and social history . Use of alcohol, tobacco or illicit drugs  . Current medications and supplements . Functional ability and status . Nutritional status . Physical  activity . Advanced directives . List of other physicians . Hospitalizations, surgeries, and ER visits in previous 12 months . Vitals . Screenings to include cognitive, depression, and falls . Referrals and appointments  In addition, I have reviewed and discussed with patient certain preventive protocols, quality metrics, and best practice recommendations. A written personalized care plan for preventive services as well as general preventive health recommendations were provided to patient.     Ofilia Neas, LPN   02/26/9415   Nurse Notes: None

## 2020-04-27 ENCOUNTER — Ambulatory Visit (INDEPENDENT_AMBULATORY_CARE_PROVIDER_SITE_OTHER): Payer: Medicare HMO

## 2020-04-27 ENCOUNTER — Other Ambulatory Visit: Payer: Self-pay

## 2020-04-27 VITALS — BP 120/80 | HR 68 | Temp 98.9°F | Wt 237.4 lb

## 2020-04-27 DIAGNOSIS — Z0000 Encounter for general adult medical examination without abnormal findings: Secondary | ICD-10-CM

## 2020-04-27 NOTE — Patient Instructions (Signed)
Cynthia Estrada , Thank you for taking time to come for your Medicare Wellness Visit. I appreciate your ongoing commitment to your health goals. Please review the following plan we discussed and let me know if I can assist you in the future.   Screening recommendations/referrals: Colonoscopy: Up to date, next due 10/19/2021 Mammogram: Up to date, next due 01/10/2021 Bone Density: No longer required  Recommended yearly ophthalmology/optometry visit for glaucoma screening and checkup Recommended yearly dental visit for hygiene and checkup  Vaccinations: Influenza vaccine: Up to date, next due fall 2022  Pneumococcal vaccine: Completed series  Tdap vaccine: Currently due you may await and injury to receive or you may receive at your local pharmacy  Shingles vaccine: Currently due for Shingrix, if you would like to receive you may do so at your local pharmacy as it is less expensive     Advanced directives: Advance directive discussed with you today. Even though you declined this today please call our office should you change your mind and we can give you the proper paperwork for you to fill out.   Conditions/risks identified: None   Next appointment: 07/06/2020 @ 10:00 am with Dr. Regis Bill    Preventive Care 65 Years and Older, Female Preventive care refers to lifestyle choices and visits with your health care provider that can promote health and wellness. What does preventive care include?  A yearly physical exam. This is also called an annual well check.  Dental exams once or twice a year.  Routine eye exams. Ask your health care provider how often you should have your eyes checked.  Personal lifestyle choices, including:  Daily care of your teeth and gums.  Regular physical activity.  Eating a healthy diet.  Avoiding tobacco and drug use.  Limiting alcohol use.  Practicing safe sex.  Taking low-dose aspirin every day.  Taking vitamin and mineral supplements as recommended  by your health care provider. What happens during an annual well check? The services and screenings done by your health care provider during your annual well check will depend on your age, overall health, lifestyle risk factors, and family history of disease. Counseling  Your health care provider may ask you questions about your:  Alcohol use.  Tobacco use.  Drug use.  Emotional well-being.  Home and relationship well-being.  Sexual activity.  Eating habits.  History of falls.  Memory and ability to understand (cognition).  Work and work Statistician.  Reproductive health. Screening  You may have the following tests or measurements:  Height, weight, and BMI.  Blood pressure.  Lipid and cholesterol levels. These may be checked every 5 years, or more frequently if you are over 32 years old.  Skin check.  Lung cancer screening. You may have this screening every year starting at age 41 if you have a 30-pack-year history of smoking and currently smoke or have quit within the past 15 years.  Fecal occult blood test (FOBT) of the stool. You may have this test every year starting at age 29.  Flexible sigmoidoscopy or colonoscopy. You may have a sigmoidoscopy every 5 years or a colonoscopy every 10 years starting at age 26.  Hepatitis C blood test.  Hepatitis B blood test.  Sexually transmitted disease (STD) testing.  Diabetes screening. This is done by checking your blood sugar (glucose) after you have not eaten for a while (fasting). You may have this done every 1-3 years.  Bone density scan. This is done to screen for osteoporosis. You may have  this done starting at age 79.  Mammogram. This may be done every 1-2 years. Talk to your health care provider about how often you should have regular mammograms. Talk with your health care provider about your test results, treatment options, and if necessary, the need for more tests. Vaccines  Your health care provider may  recommend certain vaccines, such as:  Influenza vaccine. This is recommended every year.  Tetanus, diphtheria, and acellular pertussis (Tdap, Td) vaccine. You may need a Td booster every 10 years.  Zoster vaccine. You may need this after age 55.  Pneumococcal 13-valent conjugate (PCV13) vaccine. One dose is recommended after age 52.  Pneumococcal polysaccharide (PPSV23) vaccine. One dose is recommended after age 58. Talk to your health care provider about which screenings and vaccines you need and how often you need them. This information is not intended to replace advice given to you by your health care provider. Make sure you discuss any questions you have with your health care provider. Document Released: 03/10/2015 Document Revised: 11/01/2015 Document Reviewed: 12/13/2014 Elsevier Interactive Patient Education  2017 [employer] Prevention in the Home Falls can cause injuries. They can happen to people of all ages. There are many things you can do to make your home safe and to help prevent falls. What can I do on the outside of my home?  Regularly fix the edges of walkways and driveways and fix any cracks.  Remove anything that might make you trip as you walk through a door, such as a raised step or threshold.  Trim any bushes or trees on the path to your home.  Use bright outdoor lighting.  Clear any walking paths of anything that might make someone trip, such as rocks or tools.  Regularly check to see if handrails are loose or broken. Make sure that both sides of any steps have handrails.  Any raised decks and porches should have guardrails on the edges.  Have any leaves, snow, or ice cleared regularly.  Use sand or salt on walking paths during winter.  Clean up any spills in your garage right away. This includes oil or grease spills. What can I do in the bathroom?  Use night lights.  Install grab bars by the toilet and in the tub and shower. Do not use towel  bars as grab bars.  Use non-skid mats or decals in the tub or shower.  If you need to sit down in the shower, use a plastic, non-slip stool.  Keep the floor dry. Clean up any water that spills on the floor as soon as it happens.  Remove soap buildup in the tub or shower regularly.  Attach bath mats securely with double-sided non-slip rug tape.  Do not have throw rugs and other things on the floor that can make you trip. What can I do in the bedroom?  Use night lights.  Make sure that you have a light by your bed that is easy to reach.  Do not use any sheets or blankets that are too big for your bed. They should not hang down onto the floor.  Have a firm chair that has side arms. You can use this for support while you get dressed.  Do not have throw rugs and other things on the floor that can make you trip. What can I do in the kitchen?  Clean up any spills right away.  Avoid walking on wet floors.  Keep items that you use a lot in easy-to-reach  places.  If you need to reach something above you, use a strong step stool that has a grab bar.  Keep electrical cords out of the way.  Do not use floor polish or wax that makes floors slippery. If you must use wax, use non-skid floor wax.  Do not have throw rugs and other things on the floor that can make you trip. What can I do with my stairs?  Do not leave any items on the stairs.  Make sure that there are handrails on both sides of the stairs and use them. Fix handrails that are broken or loose. Make sure that handrails are as long as the stairways.  Check any carpeting to make sure that it is firmly attached to the stairs. Fix any carpet that is loose or worn.  Avoid having throw rugs at the top or bottom of the stairs. If you do have throw rugs, attach them to the floor with carpet tape.  Make sure that you have a light switch at the top of the stairs and the bottom of the stairs. If you do not have them, ask someone to  add them for you. What else can I do to help prevent falls?  Wear shoes that:  Do not have high heels.  Have rubber bottoms.  Are comfortable and fit you well.  Are closed at the toe. Do not wear sandals.  If you use a stepladder:  Make sure that it is fully opened. Do not climb a closed stepladder.  Make sure that both sides of the stepladder are locked into place.  Ask someone to hold it for you, if possible.  Clearly mark and make sure that you can see:  Any grab bars or handrails.  First and last steps.  Where the edge of each step is.  Use tools that help you move around (mobility aids) if they are needed. These include:  Canes.  Walkers.  Scooters.  Crutches.  Turn on the lights when you go into a dark area. Replace any light bulbs as soon as they burn out.  Set up your furniture so you have a clear path. Avoid moving your furniture around.  If any of your floors are uneven, fix them.  If there are any pets around you, be aware of where they are.  Review your medicines with your doctor. Some medicines can make you feel dizzy. This can increase your chance of falling. Ask your doctor what other things that you can do to help prevent falls. This information is not intended to replace advice given to you by your health care provider. Make sure you discuss any questions you have with your health care provider. Document Released: 12/08/2008 Document Revised: 07/20/2015 Document Reviewed: 03/18/2014 Elsevier Interactive Patient Education  2017 [employer].

## 2020-05-02 ENCOUNTER — Other Ambulatory Visit: Payer: Self-pay | Admitting: Internal Medicine

## 2020-05-17 DIAGNOSIS — I10 Essential (primary) hypertension: Secondary | ICD-10-CM | POA: Diagnosis not present

## 2020-05-17 DIAGNOSIS — K219 Gastro-esophageal reflux disease without esophagitis: Secondary | ICD-10-CM | POA: Diagnosis not present

## 2020-05-17 DIAGNOSIS — Z6837 Body mass index (BMI) 37.0-37.9, adult: Secondary | ICD-10-CM | POA: Diagnosis not present

## 2020-05-17 DIAGNOSIS — M1990 Unspecified osteoarthritis, unspecified site: Secondary | ICD-10-CM | POA: Diagnosis not present

## 2020-05-17 DIAGNOSIS — Z7722 Contact with and (suspected) exposure to environmental tobacco smoke (acute) (chronic): Secondary | ICD-10-CM | POA: Diagnosis not present

## 2020-05-17 DIAGNOSIS — M109 Gout, unspecified: Secondary | ICD-10-CM | POA: Diagnosis not present

## 2020-05-17 DIAGNOSIS — E785 Hyperlipidemia, unspecified: Secondary | ICD-10-CM | POA: Diagnosis not present

## 2020-05-17 DIAGNOSIS — G8929 Other chronic pain: Secondary | ICD-10-CM | POA: Diagnosis not present

## 2020-05-24 ENCOUNTER — Other Ambulatory Visit: Payer: Self-pay | Admitting: Internal Medicine

## 2020-06-08 DIAGNOSIS — M1990 Unspecified osteoarthritis, unspecified site: Secondary | ICD-10-CM | POA: Diagnosis not present

## 2020-06-08 DIAGNOSIS — I10 Essential (primary) hypertension: Secondary | ICD-10-CM | POA: Diagnosis not present

## 2020-06-08 DIAGNOSIS — E669 Obesity, unspecified: Secondary | ICD-10-CM | POA: Diagnosis not present

## 2020-06-08 DIAGNOSIS — M7551 Bursitis of right shoulder: Secondary | ICD-10-CM | POA: Diagnosis not present

## 2020-06-08 DIAGNOSIS — M109 Gout, unspecified: Secondary | ICD-10-CM | POA: Diagnosis not present

## 2020-06-08 DIAGNOSIS — Z79899 Other long term (current) drug therapy: Secondary | ICD-10-CM | POA: Diagnosis not present

## 2020-06-08 DIAGNOSIS — M79671 Pain in right foot: Secondary | ICD-10-CM | POA: Diagnosis not present

## 2020-06-08 DIAGNOSIS — R768 Other specified abnormal immunological findings in serum: Secondary | ICD-10-CM | POA: Diagnosis not present

## 2020-06-08 LAB — CBC AND DIFFERENTIAL
HCT: 40 (ref 36–46)
Hemoglobin: 13.1 (ref 12.0–16.0)
Platelets: 181 (ref 150–399)
WBC: 4.3

## 2020-06-08 LAB — COMPREHENSIVE METABOLIC PANEL
Albumin: 4.4 (ref 3.5–5.0)
Calcium: 9.8 (ref 8.7–10.7)
Calcium: 9.8 (ref 8.7–10.7)
Globulin: 2.5

## 2020-06-08 LAB — BASIC METABOLIC PANEL
BUN: 13 (ref 4–21)
CO2: 29 — AB (ref 13–22)
Chloride: 103 (ref 99–108)
Creatinine: 1.3 — AB (ref 0.5–1.1)
Glucose: 82
Glucose: 82
Glucose: 82
Glucose: 82
Glucose: 82
Potassium: 4.9 (ref 3.4–5.3)
Sodium: 139 (ref 137–147)

## 2020-06-08 LAB — CBC: RBC: 4.87 (ref 3.87–5.11)

## 2020-06-15 ENCOUNTER — Encounter: Payer: Self-pay | Admitting: Internal Medicine

## 2020-06-15 ENCOUNTER — Telehealth: Payer: Self-pay

## 2020-06-15 NOTE — Telephone Encounter (Signed)
Lab report abstracted

## 2020-07-06 ENCOUNTER — Ambulatory Visit: Payer: Medicare HMO | Admitting: Internal Medicine

## 2020-07-25 ENCOUNTER — Ambulatory Visit (INDEPENDENT_AMBULATORY_CARE_PROVIDER_SITE_OTHER): Payer: Medicare HMO | Admitting: Internal Medicine

## 2020-07-25 ENCOUNTER — Other Ambulatory Visit: Payer: Self-pay

## 2020-07-25 ENCOUNTER — Encounter: Payer: Self-pay | Admitting: Internal Medicine

## 2020-07-25 VITALS — BP 136/78 | HR 106 | Temp 98.5°F | Ht 66.0 in | Wt 238.6 lb

## 2020-07-25 DIAGNOSIS — I10 Essential (primary) hypertension: Secondary | ICD-10-CM

## 2020-07-25 DIAGNOSIS — M2550 Pain in unspecified joint: Secondary | ICD-10-CM

## 2020-07-25 DIAGNOSIS — Z79899 Other long term (current) drug therapy: Secondary | ICD-10-CM

## 2020-07-25 DIAGNOSIS — M159 Polyosteoarthritis, unspecified: Secondary | ICD-10-CM | POA: Diagnosis not present

## 2020-07-25 DIAGNOSIS — Z8739 Personal history of other diseases of the musculoskeletal system and connective tissue: Secondary | ICD-10-CM | POA: Diagnosis not present

## 2020-07-25 DIAGNOSIS — E785 Hyperlipidemia, unspecified: Secondary | ICD-10-CM | POA: Diagnosis not present

## 2020-07-25 DIAGNOSIS — R768 Other specified abnormal immunological findings in serum: Secondary | ICD-10-CM | POA: Diagnosis not present

## 2020-07-25 DIAGNOSIS — R739 Hyperglycemia, unspecified: Secondary | ICD-10-CM | POA: Diagnosis not present

## 2020-07-25 DIAGNOSIS — N289 Disorder of kidney and ureter, unspecified: Secondary | ICD-10-CM

## 2020-07-25 MED ORDER — HYDROCHLOROTHIAZIDE 12.5 MG PO CAPS: 12.5000 mg | ORAL_CAPSULE | Freq: Every day | ORAL | 2 refills | Status: DC

## 2020-07-25 MED ORDER — LOSARTAN POTASSIUM 50 MG PO TABS: 1.0000 | ORAL_TABLET | Freq: Every day | ORAL | 2 refills | Status: DC

## 2020-07-25 MED ORDER — EZETIMIBE 10 MG PO TABS: 10.0000 mg | ORAL_TABLET | Freq: Every day | ORAL | 1 refills | Status: DC

## 2020-07-25 MED ORDER — ROSUVASTATIN CALCIUM 5 MG PO TABS: ORAL_TABLET | ORAL | 3 refills | Status: DC

## 2020-07-25 NOTE — Progress Notes (Signed)
Chief Complaint  Patient presents with  . Follow-up    HPI: Cynthia Estrada 75 y.o. come in for Chronic disease management   med check   last seen by me in November 2021. She takes losartan 50 mg a day with Microzide 12.5 mg a day Dexilant once a day if not taking get severe heartburn. Zetia daily has been on pravastatin and simvastatin in the past with potential side effects. In addition vitamin D B12 vitamin C. Last lipid panel in the system was December 2021 with a total of 264 LDL 160 triglycerides 154 HDL 75 She is working about 32 hours a week. Allopurinol  Just started .  Per her rheumatologist Dr. Wyatt Mage pretty well except knees and down are bothersome with her arthritis.  Does limit her mobility at times. She has had 3 COVID vaccines had side effects but went away in a day. ROS: See pertinent positives and negatives per HPI.    Past Medical History:  Diagnosis Date  . ANXIETY 11/11/2006  . Arthritis   . BACK PAIN, LUMBAR 07/11/2008  . Cataract    right eye removed   . Chest pain 06/12/2010   Ok stress echo has Gi sx also cw gerd.   Marland Kitchen FASTING HYPERGLYCEMIA 01/12/2007  . GERD (gastroesophageal reflux disease)   . High cholesterol   . History of CT scan of chest   . History of varicella   . HOT FLASHES 04/07/2008  . HYPERLIPIDEMIA 01/12/2007  . HYPERTENSION 11/11/2006  . LEG CRAMPS 01/12/2009  . Normal stress echocardiogram 2012  . 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 Retinal detachment   . SYNCOPE, HX OF 02/10/2007  . UNSPECIFIED ARTHROPATHY SITE UNSPECIFIED 01/12/2007  . Unspecified vitamin D deficiency 02/10/2007    Family History  Problem Relation Age of Onset  . Hypertension Mother   . Diabetes Mother   . Hypertension Sister   . Hypertension Brother   . Hyperlipidemia Brother   . Colon cancer Neg Hx   . Pancreatic cancer Neg Hx   . Rectal cancer Neg Hx   . Stomach cancer Neg Hx   . Colon polyps Neg Hx      Social History   Socioeconomic History  . Marital status: Single    Spouse name: Not on file  . Number of children: Not on file  . Years of education: Not on file  . Highest education level: Not on file  Occupational History  . Occupation: Retire    Comment: [employer]  Tobacco Use  . Smoking status: Never Smoker  . Smokeless tobacco: Never Used  Vaping Use  . Vaping Use: Never used  Substance and Sexual Activity  . Alcohol use: No  . Drug use: No  . Sexual activity: Not on file  Other Topics Concern  . 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    Social Determinants of Health   Financial Resource Strain: Low Risk   . Difficulty of Paying Living Expenses: Not hard at all  Food Insecurity: No Food Insecurity  . Worried About Charity fundraiser in the Last Year: Never true  . Ran Out of Food in the Last Year: Never  true  Transportation Needs: No Transportation Needs  . Lack of Transportation (Medical): No  . Lack of Transportation (Non-Medical): No  Physical Activity: Inactive  . Days of Exercise per Week: 0 days  . Minutes of Exercise per Session: 0 min  Stress: No Stress Concern Present  . Feeling of Stress : Not at all  Social Connections: Socially Isolated  . Frequency of Communication with Friends and Family: More than three times a week  . Frequency of Social Gatherings with Friends and Family: Twice a week  . Attends Religious Services: Never  . Active Member of Clubs or Organizations: No  . Attends Archivist Meetings: Never  . Marital Status: Never married    Outpatient Medications Prior to Visit  Medication Sig Dispense Refill  . allopurinol (ZYLOPRIM) 100 MG tablet Take 100 mg by mouth daily.    . Ascorbic Acid (VITAMIN C) 500 MG tablet Take 500  mg by mouth daily.    . cholecalciferol (VITAMIN D3) 25 MCG (1000 UNIT) tablet Take 1,000 Units by mouth daily.    . cyanocobalamin 100 MCG tablet Take 100 mcg by mouth as needed.    Marland Kitchen DEXILANT 60 MG capsule Take 1 capsule by mouth once daily 90 capsule 2  . ULORIC 40 MG tablet Take 40 mg by mouth daily.     Marland Kitchen ezetimibe (ZETIA) 10 MG tablet Take 1 tablet by mouth once daily 90 tablet 0  . hydrochlorothiazide (MICROZIDE) 12.5 MG capsule Take 1 capsule by mouth once daily 90 capsule 0  . losartan (COZAAR) 50 MG tablet Take 1 tablet by mouth once daily 90 tablet 0   Facility-Administered Medications Prior to Visit  Medication Dose Route Frequency Provider Last Rate Last Admin  . technetium tetrofosmin (TC-MYOVIEW) injection 67.3 millicurie  41.9 millicurie Intravenous Once PRN Fay Records, MD         EXAM:  BP 136/78 (BP Location: Left Arm, Patient Position: Sitting, Cuff Size: Large)   Pulse (!) 106   Temp 98.5 F (36.9 C) (Oral)   Ht 5\' 6"  (1.676 m)   Wt 238 lb 9.6 oz (108.2 kg)   SpO2 97%   BMI 38.51 kg/m   Body mass index is 38.51 kg/m. Wt Readings from Last 3 Encounters:  07/25/20 238 lb 9.6 oz (108.2 kg)  04/27/20 237 lb 6 oz (107.7 kg)  01/04/20 236 lb (107 kg)    GENERAL: vitals reviewed and listed above, alert, oriented, appears well hydrated and in no acute distress HEENT: atraumatic, conjunctiva  clear, no obvious abnormalities on inspection of external nose and ears OP : Masked NECK: no obvious masses on inspection palpation  LUNGS: clear to auscultation bilaterally, no wheezes, rales or rhonchi, good air movement Abdomen without obvious mass organomegaly. CV: HRRR, no clubbing cyanosis or  peripheral edema nl cap refill  MS: moves all extremitiesgingerly gait but independent PSYCH: pleasant and cooperative, no obvious depression or anxiety Lab Results  Component Value Date   WBC 4.3 06/08/2020   HGB 13.1 06/08/2020   HCT 40 06/08/2020   PLT 181 06/08/2020    GLUCOSE 99 02/02/2020   CHOL 264 (H) 02/02/2020   TRIG 154 (H) 02/02/2020   HDL 75 02/02/2020   LDLDIRECT 171.0 12/20/2015   LDLCALC 160 (H) 02/02/2020   ALT 21 02/02/2020   AST 18 02/02/2020   NA 139 06/08/2020   K 4.9 06/08/2020   CL 103 06/08/2020   CREATININE 1.3 (A) 06/08/2020   BUN 13 06/08/2020  CO2 29 (A) 06/08/2020   TSH 1.61 07/04/2017   HGBA1C 5.9 (H) 02/02/2020   BP Readings from Last 3 Encounters:  07/25/20 136/78  04/27/20 120/80  01/04/20 (!) 152/88    ASSESSMENT AND PLAN:  Discussed the following assessment and plan:  Essential hypertension - Plan: Lipid panel, Basic metabolic panel, Hemoglobin A1c, Hepatic function panel  Hyperlipidemia, unspecified hyperlipidemia type - Plan: Lipid panel, Basic metabolic panel, Hemoglobin A1c, Hepatic function panel  Medication management - Plan: Lipid panel, Basic metabolic panel, Hemoglobin A1c, Hepatic function panel  Multiple joint pain - Plan: Lipid panel, Basic metabolic panel, Hemoglobin A1c, Hepatic function panel  History of gout - Plan: Lipid panel, Basic metabolic panel, Hemoglobin A1c, Hepatic function panel  Renal insufficiency - Plan: Lipid panel, Basic metabolic panel, Hemoglobin A1c, Hepatic function panel  Hyperglycemia - Plan: Lipid panel, Basic metabolic panel, Hemoglobin A1c, Hepatic function panel  Osteoarthritis of multiple joints, unspecified osteoarthritis type  Rheumatoid factor positive Lipids were last quite high despite being on Zetia.  We will try weekly low-dose Crestor to increase to 2 doses a week as tolerated her side effects of previous statins were the myalgias. Refill antihypertensive medication and Zetia today. Plan fasting labs November and follow-up CPX as indicated.  Abstracted labs from rheumatology from April and other chart review.  Visit and plan 34 minutes -Patient advised to return or notify health care team  if  new concerns arise.  Patient Instructions  Continue  blood pressure medication will refill sent to your pharmacy. Your cholesterol done last was still quite high even on the Zetia daily. Will add low-dose once a week statin medicine Crestor if you are getting side effects let us know and we will stop But this may be helpful in getting a number down.  Plan fasting blood work and then office visit to go over results in November or as needed. Will have rheumatology manage your joint  problems and medications    Standley Brooking. Matej Sappenfield M.D.

## 2020-07-25 NOTE — Patient Instructions (Addendum)
Continue blood pressure medication will refill sent to your pharmacy. Your cholesterol done last was still quite high even on the Zetia daily. Will add low-dose once a week statin medicine Crestor if you are getting side effects let us know and we will stop But this may be helpful in getting a number down.  Plan fasting blood work and then office visit to go over results in November or as needed. Will have rheumatology manage your joint  problems and medications

## 2020-07-26 ENCOUNTER — Encounter: Payer: Self-pay | Admitting: Internal Medicine

## 2020-10-31 ENCOUNTER — Other Ambulatory Visit: Payer: Self-pay | Admitting: Internal Medicine

## 2020-12-12 ENCOUNTER — Other Ambulatory Visit: Payer: Self-pay | Admitting: Internal Medicine

## 2020-12-12 DIAGNOSIS — Z1231 Encounter for screening mammogram for malignant neoplasm of breast: Secondary | ICD-10-CM

## 2021-01-02 ENCOUNTER — Other Ambulatory Visit: Payer: Medicare HMO

## 2021-01-04 ENCOUNTER — Other Ambulatory Visit: Payer: Self-pay

## 2021-01-04 ENCOUNTER — Other Ambulatory Visit (INDEPENDENT_AMBULATORY_CARE_PROVIDER_SITE_OTHER): Payer: Medicare HMO

## 2021-01-04 DIAGNOSIS — R739 Hyperglycemia, unspecified: Secondary | ICD-10-CM | POA: Diagnosis not present

## 2021-01-04 DIAGNOSIS — M2550 Pain in unspecified joint: Secondary | ICD-10-CM | POA: Diagnosis not present

## 2021-01-04 DIAGNOSIS — I10 Essential (primary) hypertension: Secondary | ICD-10-CM | POA: Diagnosis not present

## 2021-01-04 DIAGNOSIS — N289 Disorder of kidney and ureter, unspecified: Secondary | ICD-10-CM

## 2021-01-04 DIAGNOSIS — M1990 Unspecified osteoarthritis, unspecified site: Secondary | ICD-10-CM | POA: Diagnosis not present

## 2021-01-04 DIAGNOSIS — Z79899 Other long term (current) drug therapy: Secondary | ICD-10-CM | POA: Diagnosis not present

## 2021-01-04 DIAGNOSIS — Z23 Encounter for immunization: Secondary | ICD-10-CM | POA: Diagnosis not present

## 2021-01-04 DIAGNOSIS — Z8739 Personal history of other diseases of the musculoskeletal system and connective tissue: Secondary | ICD-10-CM | POA: Diagnosis not present

## 2021-01-04 DIAGNOSIS — M25562 Pain in left knee: Secondary | ICD-10-CM | POA: Diagnosis not present

## 2021-01-04 DIAGNOSIS — M79671 Pain in right foot: Secondary | ICD-10-CM | POA: Diagnosis not present

## 2021-01-04 DIAGNOSIS — M109 Gout, unspecified: Secondary | ICD-10-CM | POA: Diagnosis not present

## 2021-01-04 DIAGNOSIS — R768 Other specified abnormal immunological findings in serum: Secondary | ICD-10-CM | POA: Diagnosis not present

## 2021-01-04 DIAGNOSIS — E785 Hyperlipidemia, unspecified: Secondary | ICD-10-CM

## 2021-01-04 DIAGNOSIS — E669 Obesity, unspecified: Secondary | ICD-10-CM | POA: Diagnosis not present

## 2021-01-04 LAB — BASIC METABOLIC PANEL
BUN: 14 mg/dL (ref 6–23)
CO2: 28 mEq/L (ref 19–32)
Calcium: 9.9 mg/dL (ref 8.4–10.5)
Chloride: 102 mEq/L (ref 96–112)
Creatinine, Ser: 1.31 mg/dL — ABNORMAL HIGH (ref 0.40–1.20)
GFR: 39.49 mL/min — ABNORMAL LOW (ref >60.00)
Glucose, Bld: 110 mg/dL — ABNORMAL HIGH (ref 70–99)
Potassium: 3.8 mEq/L (ref 3.5–5.1)
Sodium: 139 mEq/L (ref 135–145)

## 2021-01-04 LAB — HEPATIC FUNCTION PANEL
ALT: 21 U/L (ref 0–35)
AST: 24 U/L (ref 0–37)
Albumin: 4.6 g/dL (ref 3.5–5.2)
Alkaline Phosphatase: 62 U/L (ref 39–117)
Bilirubin, Direct: 0.2 mg/dL (ref 0.0–0.3)
Total Bilirubin: 1 mg/dL (ref 0.2–1.2)
Total Protein: 6.8 g/dL (ref 6.0–8.3)

## 2021-01-04 LAB — LIPID PANEL
Cholesterol: 226 mg/dL — ABNORMAL HIGH (ref 0–200)
HDL: 65.8 mg/dL (ref >39.00)
LDL Cholesterol: 128 mg/dL — ABNORMAL HIGH (ref 0–99)
NonHDL: 159.77
Total CHOL/HDL Ratio: 3
Triglycerides: 159 mg/dL — ABNORMAL HIGH (ref 0.0–149.0)
VLDL: 31.8 mg/dL (ref 0.0–40.0)

## 2021-01-04 LAB — HEMOGLOBIN A1C: Hgb A1c MFr Bld: 6.3 % (ref 4.6–6.5)

## 2021-01-06 NOTE — Progress Notes (Signed)
Discuss at upcoming visit      lipids blood sugar kidney function /

## 2021-01-08 NOTE — Progress Notes (Signed)
Chief Complaint  Patient presents with   Annual Exam     HPI: Cynthia Estrada 75 y.o. comes in today for Preventive Medicare exam/ wellness visit .Since last visit.  BP ok on losartan and hctz  MS hurts a lot esp left knee  sees dr Dossie Der offered injection not taken   has pos serology  recnetly placed onuloric instead of allopurinol? Hldtaking crestor once a week.needs refill GERD    dexilant refill  next  year notified  insurance will not cover and will have to change  med   Has nocturia    Health Maintenance  Topic Date Due   Zoster Vaccines- Shingrix (1 of 2) Never done   COVID-19 Vaccine (3 - Pfizer risk series) 04/27/2019   TETANUS/TDAP  10/17/2019   MAMMOGRAM  01/10/2021   COLONOSCOPY (Pts 45-50yrs Insurance coverage will need to be confirmed)  10/19/2021   Pneumonia Vaccine 83+ Years old  Completed   INFLUENZA VACCINE  Completed   DEXA SCAN  Completed   Hepatitis C Screening  Completed   HPV VACCINES  Aged Out   Health Maintenance Review LIFESTYLE:  Exercise:  mobility impaired   knee joints   Tobacco/ETS:n Alcohol: n Sugar beverages: no dialy  Sleep:3-4  hours   nocturia  fluid pills .  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 Last eye check UTD Safety:  Has smoke detector and wears seat belts.   No excess sun exposure. Sees dentist regularly. Falls: n Memory: Felt to be good  , no concern from her or her family. Depression: No anhedonia unusual crying or depressive symptoms Nutrition: Eats well balanced diet; adequate calcium and vitamin D. No swallowing chewing problems. Injury: no major injuries in the last six months. Other healthcare providers:  Reviewed today . Preventive parameters:Reviewed  ADLS:   There are no problems or need for assistance  driving, feeding, obtaining food, dressing, toileting and bathing, managing  money using phone. She is independent. But   uses cane mobility     ROS:  GREST of 12 system review negative except as per HPI   Past Medical History:  Diagnosis Date   ANXIETY 11/11/2006   Arthritis    BACK PAIN, LUMBAR 07/11/2008   Cataract    right eye removed    Chest pain 06/12/2010   Ok stress echo has Gi sx also cw gerd.    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   Retinal detachment    SYNCOPE, HX OF 02/10/2007   UNSPECIFIED ARTHROPATHY SITE UNSPECIFIED 01/12/2007   Unspecified vitamin D deficiency 02/10/2007    Family History  Problem Relation Age of Onset   Hypertension Mother    Diabetes Mother    Hypertension Sister    Hypertension Brother    Hyperlipidemia Brother    Colon cancer Neg Hx    Pancreatic cancer Neg Hx    Rectal cancer Neg Hx    Stomach cancer Neg Hx  Colon polyps Neg Hx     Social History   Socioeconomic History   Marital status: Single    Spouse name: Not on file   Number of children: Not on file   Years of education: Not on file   Highest education level: Not on file  Occupational History   Occupation: Retire    Comment: [employer]  Tobacco Use   Smoking status: Never   Smokeless tobacco: Never  Vaping Use   Vaping Use: Never used  Substance and Sexual Activity   Alcohol use: No   Drug use: No   Sexual activity: Not on file  Other Topics Concern   Not on file  Social History Narrative   HH of 1    No pets   Retired from [employer] walking  onlylimited by knee oa changes    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    Social Determinants of  Health   Financial Resource Strain: Low Risk    Difficulty of Paying Living Expenses: Not hard at all  Food Insecurity: No Food Insecurity   Worried About Charity fundraiser in the Last Year: Never true   [place] in the Last Year: Never true  Transportation Needs: No Transportation Needs   Lack of Transportation (Medical): No   Lack of Transportation (Non-Medical): No  Physical Activity: Inactive   Days of Exercise per Week: 0 days   Minutes of Exercise per Session: 0 min  Stress: No Stress Concern Present   Feeling of Stress : Not at all  Social Connections: Socially Isolated   Frequency of Communication with Friends and Family: More than three times a week   Frequency of Social Gatherings with Friends and Family: Twice a week   Attends Religious Services: Never   Marine scientist or Organizations: No   Attends Archivist Meetings: Never   Marital Status: Never married    Outpatient Encounter Medications as of 01/09/2021  Medication Sig   allopurinol (ZYLOPRIM) 100 MG tablet Take 100 mg by mouth daily.   Ascorbic Acid (VITAMIN C) 500 MG tablet Take 500 mg by mouth daily.   cholecalciferol (VITAMIN D3) 25 MCG (1000 UNIT) tablet Take 1,000 Units by mouth daily.   cyanocobalamin 100 MCG tablet Take 100 mcg by mouth as needed.   ezetimibe (ZETIA) 10 MG tablet Take 1 tablet (10 mg total) by mouth daily.   hydrochlorothiazide (MICROZIDE) 12.5 MG capsule Take 1 capsule (12.5 mg total) by mouth daily.   losartan (COZAAR) 50 MG tablet Take 1 tablet (50 mg total) by mouth daily.   ULORIC 40 MG tablet Take 40 mg by mouth daily.    [DISCONTINUED] DEXILANT 60 MG capsule Take 1 capsule by mouth once daily   [DISCONTINUED] rosuvastatin (CRESTOR) 5 MG tablet Take 1 by mouth weekly.  May increase to 2/week if tolerated.   DEXILANT 60 MG capsule Take 1 capsule (60 mg total) by mouth daily.   rosuvastatin (CRESTOR) 5 MG tablet Take 1 by mouth 2 x per week  can increase to  3 x per week as tolerated   Facility-Administered Encounter Medications as of 01/09/2021  Medication   technetium tetrofosmin (TC-MYOVIEW) injection 50.5 millicurie    EXAM:  BP 138/82 (BP Location: Right Arm, Patient Position: Sitting, Cuff Size: Large)   Pulse 69   Temp 98.8 F (37.1 C) (Oral)   Ht  5\' 6"  (1.676 m)   Wt 245 lb 3.2 oz (111.2 kg)   SpO2 98%   BMI 39.58 kg/m   Body mass index is 39.58 kg/m. Wt Readings from Last 3 Encounters:  01/09/21 245 lb 3.2 oz (111.2 kg)  07/25/20 238 lb 9.6 oz (108.2 kg)  04/27/20 237 lb 6 oz (107.7 kg)   BP Readings from Last 3 Encounters:  01/09/21 138/82  07/25/20 136/78  04/27/20 120/80    Physical Exam: Vital signs reviewed GYI:RSWN is a well-developed well-nourished alert cooperative   who appears stated age in no acute distress.  Cane  slow  HEENT: normocephalic atraumatic , Eyes: PERRL EOM's full, conjunctiva clear,  tenderness., Ears: no deformity EAC's clear TMs with normal landmarks. Mouth: masked  NECK: supple without masses, thyromegaly or bruits. CHEST/PULM:  Clear to auscultation and percussion breath sounds equal no wheeze , rales or rhonchi. No chest wall deformities or tenderness. Breast: normal by inspection . No dimpling, discharge, masses, tenderness or discharge . CV: PMI is nondisplaced, S1 S2 no gallops, murmurs, rubs. Peripheral pulses are full without delay.No JVD .  ABDOMEN: Bowel sounds normal nontender  No guard or rebound, no hepato splenomegal no CVA tenderness.   Extremtities:  No clubbing cyanosis or edema, no acute joint swelling or redness no focal atrophy djd changes  esp left knee  no redness  NEURO:  Oriented x3, cranial nerves 3-12 appear to be intact, no obvious focal weakness,gait within normal limits no grossly nl reflexes  SKIN: No acute rashes normal turgor, color, no bruising or petechiae. PSYCH: Oriented, good eye contact, no obvious depression anxiety, cognition and judgment appear  normal. LN: no cervical axillary  adenopathy No noted deficits in memory, attention, and speech.   Lab Results  Component Value Date   WBC 4.3 06/08/2020   HGB 13.1 06/08/2020   HCT 40 06/08/2020   PLT 181 06/08/2020   GLUCOSE 110 (H) 01/04/2021   CHOL 226 (H) 01/04/2021   TRIG 159.0 (H) 01/04/2021   HDL 65.80 01/04/2021   LDLDIRECT 171.0 12/20/2015   LDLCALC 128 (H) 01/04/2021   ALT 21 01/04/2021   AST 24 01/04/2021   NA 139 01/04/2021   K 3.8 01/04/2021   CL 102 01/04/2021   CREATININE 1.31 (H) 01/04/2021   BUN 14 01/04/2021   CO2 28 01/04/2021   TSH 1.61 07/04/2017   HGBA1C 6.3 01/04/2021    ASSESSMENT AND PLAN:  Discussed the following assessment and plan:  Visit for preventive health examination  Hyperglycemia  Essential hypertension  Hyperlipidemia, unspecified hyperlipidemia type  History of gout  Osteoarthritis of multiple joints, unspecified osteoarthritis type  Rheumatoid factor positive  Nocturia Reviewed     Repeat bp was  138/82 r arm large  Disc  ms  control  may benefit from knee injection  use aggressive topical and tylenol at this time  Lsi to avoid  elevation of BG  Follow creatinine Healthy weight control may help pain  Patient Care Team: Burnis Medin, MD as PCP - General Jettie Booze, MD as PCP - Cardiology (Cardiology) Gatha Mayer, MD (Gastroenterology) Addison Lank, MD as Referring Physician (Dermatology) Josue Hector, MD (Cardiology) Valinda Party, MD (Rheumatology)  Patient Instructions   Will refill dexilant and consider other meds after first of year such as nexium  protonix   Can take   hctz and losartan at the same time. See if helps  urinating at night   Try topical voltaren  3-4 x per day on left knee . 10 - 14 days in a row .  In addition to  tylenol   consider   having  rheumatology / ortho  with  injections would be helpful. For the knee.   Try taking the crestor  2-3 x per week as possible.    Repeat BP was 138/82  Plan rov in 6 mos  we can check BG  etc at the visit   [place]. Lamarius Dirr M.D.

## 2021-01-09 ENCOUNTER — Encounter: Payer: Self-pay | Admitting: Internal Medicine

## 2021-01-09 ENCOUNTER — Ambulatory Visit (INDEPENDENT_AMBULATORY_CARE_PROVIDER_SITE_OTHER): Payer: Medicare HMO | Admitting: Internal Medicine

## 2021-01-09 VITALS — BP 138/82 | HR 69 | Temp 98.8°F | Ht 66.0 in | Wt 245.2 lb

## 2021-01-09 DIAGNOSIS — I10 Essential (primary) hypertension: Secondary | ICD-10-CM

## 2021-01-09 DIAGNOSIS — R739 Hyperglycemia, unspecified: Secondary | ICD-10-CM | POA: Diagnosis not present

## 2021-01-09 DIAGNOSIS — Z0000 Encounter for general adult medical examination without abnormal findings: Secondary | ICD-10-CM

## 2021-01-09 DIAGNOSIS — E785 Hyperlipidemia, unspecified: Secondary | ICD-10-CM

## 2021-01-09 DIAGNOSIS — Z8739 Personal history of other diseases of the musculoskeletal system and connective tissue: Secondary | ICD-10-CM | POA: Diagnosis not present

## 2021-01-09 DIAGNOSIS — R351 Nocturia: Secondary | ICD-10-CM | POA: Diagnosis not present

## 2021-01-09 DIAGNOSIS — R768 Other specified abnormal immunological findings in serum: Secondary | ICD-10-CM

## 2021-01-09 DIAGNOSIS — M159 Polyosteoarthritis, unspecified: Secondary | ICD-10-CM | POA: Diagnosis not present

## 2021-01-09 MED ORDER — ROSUVASTATIN CALCIUM 5 MG PO TABS: ORAL_TABLET | ORAL | 3 refills | Status: DC

## 2021-01-09 MED ORDER — DEXILANT 60 MG PO CPDR: 1.0000 | DELAYED_RELEASE_CAPSULE | Freq: Every day | ORAL | 0 refills | Status: DC

## 2021-01-09 NOTE — Patient Instructions (Addendum)
  Will refill dexilant and consider other meds after first of year such as nexium  protonix   Can take   hctz and losartan at the same time. See if helps  urinating at night   Try topical voltaren  3-4 x per day on left knee . 10 - 14 days in a row .  In addition to  tylenol   consider   having  rheumatology / ortho  with  injections would be helpful. For the knee.   Try taking the crestor  2-3 x per week as possible.   Repeat BP was 138/82  Plan rov in 6 mos  we can check BG  etc at the visit

## 2021-01-11 ENCOUNTER — Ambulatory Visit
Admission: RE | Admit: 2021-01-11 | Discharge: 2021-01-11 | Disposition: A | Discharge status: Home / Self Care | Payer: Medicare HMO | Source: Ambulatory Visit | Attending: Internal Medicine | Admitting: Internal Medicine

## 2021-01-11 DIAGNOSIS — Z1231 Encounter for screening mammogram for malignant neoplasm of breast: Secondary | ICD-10-CM | POA: Diagnosis not present

## 2021-01-22 DIAGNOSIS — I10 Essential (primary) hypertension: Secondary | ICD-10-CM | POA: Diagnosis not present

## 2021-01-22 DIAGNOSIS — M109 Gout, unspecified: Secondary | ICD-10-CM | POA: Diagnosis not present

## 2021-01-22 DIAGNOSIS — M1990 Unspecified osteoarthritis, unspecified site: Secondary | ICD-10-CM | POA: Diagnosis not present

## 2021-01-22 DIAGNOSIS — M79671 Pain in right foot: Secondary | ICD-10-CM | POA: Diagnosis not present

## 2021-01-22 DIAGNOSIS — R768 Other specified abnormal immunological findings in serum: Secondary | ICD-10-CM | POA: Diagnosis not present

## 2021-01-22 DIAGNOSIS — M25562 Pain in left knee: Secondary | ICD-10-CM | POA: Diagnosis not present

## 2021-01-22 DIAGNOSIS — Z79899 Other long term (current) drug therapy: Secondary | ICD-10-CM | POA: Diagnosis not present

## 2021-01-22 LAB — COMPREHENSIVE METABOLIC PANEL
Albumin: 4.4 (ref 3.5–5.0)
Calcium: 9.8 (ref 8.7–10.7)
GFR calc Af Amer: 42.19
GFR calc non Af Amer: 51.04

## 2021-01-22 LAB — BASIC METABOLIC PANEL
BUN: 10 (ref 4–21)
CO2: 29 — AB (ref 13–22)
Chloride: 103 (ref 99–108)
Creatinine: 1.3 — AB (ref 0.5–1.1)
Glucose: 82
Potassium: 4.9 (ref 3.4–5.3)
Sodium: 139 (ref 137–147)

## 2021-01-22 LAB — CBC: RBC: 4.87 (ref 3.87–5.11)

## 2021-01-22 LAB — HEPATIC FUNCTION PANEL
ALT: 37 — AB (ref 7–35)
AST: 27 (ref 13–35)
Alkaline Phosphatase: 75 (ref 25–125)
Bilirubin, Total: 1.2

## 2021-01-22 LAB — CBC AND DIFFERENTIAL
HCT: 40 (ref 36–46)
Hemoglobin: 13.1 (ref 12.0–16.0)
Platelets: 181 (ref 150–399)
WBC: 4.3

## 2021-01-23 DIAGNOSIS — H524 Presbyopia: Secondary | ICD-10-CM | POA: Diagnosis not present

## 2021-02-01 ENCOUNTER — Encounter: Payer: Self-pay | Admitting: Internal Medicine

## 2021-02-23 ENCOUNTER — Other Ambulatory Visit: Payer: Self-pay | Admitting: Internal Medicine

## 2021-04-03 ENCOUNTER — Telehealth: Payer: Self-pay | Admitting: Internal Medicine

## 2021-04-03 NOTE — Telephone Encounter (Signed)
Left message for patient to call back and schedule Medicare Annual Wellness Visit (AWV) either virtually or in office. Left  my Herbie Drape number [phone]   Last AWV 04/27/20  please schedule at anytime with [facility] Nurse Health Advisor 1 or 2  atena can schedule awv calendar year   This should be a 45 minute visit.

## 2021-04-05 ENCOUNTER — Ambulatory Visit (INDEPENDENT_AMBULATORY_CARE_PROVIDER_SITE_OTHER): Payer: Medicare HMO

## 2021-04-05 VITALS — BP 130/68 | HR 66 | Temp 98.1°F | Ht 66.0 in | Wt 251.1 lb

## 2021-04-05 DIAGNOSIS — Z0000 Encounter for general adult medical examination without abnormal findings: Secondary | ICD-10-CM

## 2021-04-05 NOTE — Patient Instructions (Addendum)
Cynthia Estrada , Thank you for taking time to come for your Medicare Wellness Visit. I appreciate your ongoing commitment to your health goals. Please review the following plan we discussed and let me know if I can assist you in the future.   These are the goals we discussed:  Goals   None     This is a list of the screening recommended for you and due dates:  Health Maintenance  Topic Date Due   COVID-19 Vaccine (3 - Pfizer risk series) 04/21/2021*   Zoster (Shingles) Vaccine (1 of 2) 07/03/2021*   Tetanus Vaccine  04/05/2022*   Colon Cancer Screening  10/19/2021   Mammogram  01/11/2022   Pneumonia Vaccine  Completed   Flu Shot  Completed   DEXA scan (bone density measurement)  Completed   Hepatitis C Screening: USPSTF Recommendation to screen - Ages 82-79 yo.  Completed   HPV Vaccine  Aged Out  *Topic was postponed. The date shown is not the original due date.     Advanced directives: No Patient deferred  Conditions/risks identified: Non  Next appointment: Follow up in one year for your annual wellness visit    Preventive Care 65 Years and Older, Female Preventive care refers to lifestyle choices and visits with your health care provider that can promote health and wellness. What does preventive care include? A yearly physical exam. This is also called an annual well check. Dental exams once or twice a year. Routine eye exams. Ask your health care provider how often you should have your eyes checked. Personal lifestyle choices, including: Daily care of your teeth and gums. Regular physical activity. Eating a healthy diet. Avoiding tobacco and drug use. Limiting alcohol use. Practicing safe sex. Taking low-dose aspirin every day. Taking vitamin and mineral supplements as recommended by your health care provider. What happens during an annual well check? The services and screenings done by your health care provider during your annual well check will depend on your age,  overall health, lifestyle risk factors, and family history of disease. Counseling  Your health care provider may ask you questions about your: Alcohol use. Tobacco use. Drug use. Emotional well-being. Home and relationship well-being. Sexual activity. Eating habits. History of falls. Memory and ability to understand (cognition). Work and work Statistician. Reproductive health. Screening  You may have the following tests or measurements: Height, weight, and BMI. Blood pressure. Lipid and cholesterol levels. These may be checked every 5 years, or more frequently if you are over 5 years old. Skin check. Lung cancer screening. You may have this screening every year starting at age 56 if you have a 30-pack-year history of smoking and currently smoke or have quit within the past 15 years. Fecal occult blood test (FOBT) of the stool. You may have this test every year starting at age 49. Flexible sigmoidoscopy or colonoscopy. You may have a sigmoidoscopy every 5 years or a colonoscopy every 10 years starting at age 24. Hepatitis C blood test. Hepatitis B blood test. Sexually transmitted disease (STD) testing. Diabetes screening. This is done by checking your blood sugar (glucose) after you have not eaten for a while (fasting). You may have this done every 1-3 years. Bone density scan. This is done to screen for osteoporosis. You may have this done starting at age 69. Mammogram. This may be done every 1-2 years. Talk to your health care provider about how often you should have regular mammograms. Talk with your health care provider about your test results,  treatment options, and if necessary, the need for more tests. Vaccines  Your health care provider may recommend certain vaccines, such as: Influenza vaccine. This is recommended every year. Tetanus, diphtheria, and acellular pertussis (Tdap, Td) vaccine. You may need a Td booster every 10 years. Zoster vaccine. You may need this after age  54. Pneumococcal 13-valent conjugate (PCV13) vaccine. One dose is recommended after age 19. Pneumococcal polysaccharide (PPSV23) vaccine. One dose is recommended after age 25. Talk to your health care provider about which screenings and vaccines you need and how often you need them. This information is not intended to replace advice given to you by your health care provider. Make sure you discuss any questions you have with your health care provider. Document Released: 03/10/2015 Document Revised: 11/01/2015 Document Reviewed: 12/13/2014 Elsevier Interactive Patient Education  2017 [place] Prevention in the Home Falls can cause injuries. They can happen to people of all ages. There are many things you can do to make your home safe and to help prevent falls. What can I do on the outside of my home? Regularly fix the edges of walkways and driveways and fix any cracks. Remove anything that might make you trip as you walk through a door, such as a raised step or threshold. Trim any bushes or trees on the path to your home. Use bright outdoor lighting. Clear any walking paths of anything that might make someone trip, such as rocks or tools. Regularly check to see if handrails are loose or broken. Make sure that both sides of any steps have handrails. Any raised decks and porches should have guardrails on the edges. Have any leaves, snow, or ice cleared regularly. Use sand or salt on walking paths during winter. Clean up any spills in your garage right away. This includes oil or grease spills. What can I do in the bathroom? Use night lights. Install grab bars by the toilet and in the tub and shower. Do not use towel bars as grab bars. Use non-skid mats or decals in the tub or shower. If you need to sit down in the shower, use a plastic, non-slip stool. Keep the floor dry. Clean up any water that spills on the floor as soon as it happens. Remove soap buildup in the tub or shower  regularly. Attach bath mats securely with double-sided non-slip rug tape. Do not have throw rugs and other things on the floor that can make you trip. What can I do in the bedroom? Use night lights. Make sure that you have a light by your bed that is easy to reach. Do not use any sheets or blankets that are too big for your bed. They should not hang down onto the floor. Have a firm chair that has side arms. You can use this for support while you get dressed. Do not have throw rugs and other things on the floor that can make you trip. What can I do in the kitchen? Clean up any spills right away. Avoid walking on wet floors. Keep items that you use a lot in easy-to-reach places. If you need to reach something above you, use a strong step stool that has a grab bar. Keep electrical cords out of the way. Do not use floor polish or wax that makes floors slippery. If you must use wax, use non-skid floor wax. Do not have throw rugs and other things on the floor that can make you trip. What can I do with my stairs? Do  not leave any items on the stairs. Make sure that there are handrails on both sides of the stairs and use them. Fix handrails that are broken or loose. Make sure that handrails are as long as the stairways. Check any carpeting to make sure that it is firmly attached to the stairs. Fix any carpet that is loose or worn. Avoid having throw rugs at the top or bottom of the stairs. If you do have throw rugs, attach them to the floor with carpet tape. Make sure that you have a light switch at the top of the stairs and the bottom of the stairs. If you do not have them, ask someone to add them for you. What else can I do to help prevent falls? Wear shoes that: Do not have high heels. Have rubber bottoms. Are comfortable and fit you well. Are closed at the toe. Do not wear sandals. If you use a stepladder: Make sure that it is fully opened. Do not climb a closed stepladder. Make sure that  both sides of the stepladder are locked into place. Ask someone to hold it for you, if possible. Clearly mark and make sure that you can see: Any grab bars or handrails. First and last steps. Where the edge of each step is. Use tools that help you move around (mobility aids) if they are needed. These include: Canes. Walkers. Scooters. Crutches. Turn on the lights when you go into a dark area. Replace any light bulbs as soon as they burn out. Set up your furniture so you have a clear path. Avoid moving your furniture around. If any of your floors are uneven, fix them. If there are any pets around you, be aware of where they are. Review your medicines with your doctor. Some medicines can make you feel dizzy. This can increase your chance of falling. Ask your doctor what other things that you can do to help prevent falls. This information is not intended to replace advice given to you by your health care provider. Make sure you discuss any questions you have with your health care provider. Document Released: 12/08/2008 Document Revised: 07/20/2015 Document Reviewed: 03/18/2014 Elsevier Interactive Patient Education  2017 [employer].

## 2021-04-05 NOTE — Progress Notes (Signed)
Subjective:   Cynthia Estrada is a 76 y.o. female who presents for Medicare Annual (Subsequent) preventive examination.  Review of Systems     Cardiac Risk Factors include: advanced age (>20men, >39 women);hypertension     Objective:    Today's Vitals   04/05/21 1028  BP: 130/68  Pulse: 66  Temp: 98.1 F (36.7 C)  TempSrc: Oral  SpO2: 98%  Weight: 251 lb 1.6 oz (113.9 kg)  Height: 5\' 6"  (1.676 m)   Body mass index is 40.53 kg/m.  Advanced Directives 04/05/2021 04/27/2020 09/07/2013  Does Patient Have a Medical Advance Directive? No No Patient does not have advance directive  Would patient like information on creating a medical advance directive? No - Patient declined Yes (MAU/Ambulatory/Procedural Areas - Information given) -  Pre-existing out of facility DNR order (yellow form or pink MOST form) - - No    Current Medications (verified) Outpatient Encounter Medications as of 04/05/2021  Medication Sig   allopurinol (ZYLOPRIM) 100 MG tablet Take 100 mg by mouth daily.   Ascorbic Acid (VITAMIN C) 500 MG tablet Take 500 mg by mouth daily.   cholecalciferol (VITAMIN D3) 25 MCG (1000 UNIT) tablet Take 1,000 Units by mouth daily.   cyanocobalamin 100 MCG tablet Take 100 mcg by mouth as needed.   DEXILANT 60 MG capsule Take 1 capsule (60 mg total) by mouth daily.   ezetimibe (ZETIA) 10 MG tablet Take 1 tablet by mouth once daily   hydrochlorothiazide (MICROZIDE) 12.5 MG capsule Take 1 capsule (12.5 mg total) by mouth daily.   losartan (COZAAR) 50 MG tablet Take 1 tablet (50 mg total) by mouth daily.   rosuvastatin (CRESTOR) 5 MG tablet Take 1 by mouth 2 x per week  can increase to 3 x per week as tolerated   ULORIC 40 MG tablet Take 40 mg by mouth daily.    Facility-Administered Encounter Medications as of 04/05/2021  Medication   technetium tetrofosmin (TC-MYOVIEW) injection 73.2 millicurie    Allergies (verified) Simvastatin, Atorvastatin, Lisinopril, and Pravastatin    History: Past Medical History:  Diagnosis Date   ANXIETY 11/11/2006   Arthritis    BACK PAIN, LUMBAR 07/11/2008   Cataract    right eye removed    Chest pain 06/12/2010   Ok stress echo has Gi sx also cw gerd.    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   Retinal detachment    SYNCOPE, HX OF 02/10/2007   UNSPECIFIED ARTHROPATHY SITE UNSPECIFIED 01/12/2007   Unspecified vitamin D deficiency 02/10/2007   Past Surgical History:  Procedure Laterality Date   ABDOMINAL HYSTERECTOMY     age 85 for fibroids   CATARACT EXTRACTION Right    done with retinal detachment repair    COLON SURGERY  08/2009   COLONOSCOPY  07/01/2009   Two tubulovillous adenoms 7 and 4 cm size   COLONOSCOPY W/ POLYPECTOMY  07/11/2010   two 7-8 mm polyps removed - adenomas   hemicollectomy  08/2009   Right for tubulovillous adenomas (2)   RETINAL DETACHMENT SURGERY     Family History  Problem Relation Age of Onset   Hypertension Mother  Diabetes Mother    Hypertension Sister    Hypertension Brother    Hyperlipidemia Brother    Colon cancer Neg Hx    Pancreatic cancer Neg Hx    Rectal cancer Neg Hx    Stomach cancer Neg Hx    Colon polyps Neg Hx    Breast cancer Neg Hx    Social History   Socioeconomic History   Marital status: Single    Spouse name: Not on file   Number of children: Not on file   Years of education: Not on file   Highest education level: Not on file  Occupational History   Occupation: Retire    Comment: [employer]  Tobacco Use   Smoking status: Never   Smokeless tobacco: Never  Vaping Use   Vaping Use: Never used  Substance and Sexual Activity   Alcohol  use: No   Drug use: No   Sexual activity: Not on file  Other Topics Concern   Not on file  Social History Narrative   HH of 1    No pets   Retired from [employer] walking  onlylimited by knee oa changes    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    Social Determinants of Health   Financial Resource Strain: Low Risk    Difficulty of Paying Living Expenses: Not hard at all  Food Insecurity: No Food Insecurity   Worried About Charity fundraiser in the Last Year: Never true   [place] in the Last Year: Never true  Transportation Needs: No Transportation Needs   Lack of Transportation (Medical): No   Lack of Transportation (Non-Medical): No  Physical Activity: Insufficiently Active   Days of Exercise per Week: 2 days   Minutes of Exercise per Session: 10 min  Stress: No Stress Concern Present   Feeling of Stress : Not at all  Social Connections: Moderately Integrated   Frequency of Communication with Friends and Family: More than three times a week   Frequency of Social Gatherings with Friends and Family: More than three times a week   Attends Religious Services: More than 4 times per year   Active Member of [employer] or Organizations: Yes   Attends Music therapist: More than 4 times per year   Marital Status: Never married     Clinical Intake:  Pre-visit preparation completed: Yes  Diabetic? No  Interpreter Needed?: NoActivities of Daily Living In your present state of health, do you have any difficulty performing the following activities: 04/05/2021 04/27/2020  Hearing? N N  Vision? N N  Difficulty concentrating or making decisions? N N  Walking or climbing stairs? N N  Comment Patient uses a cane -  Dressing or bathing? N N  Doing errands, shopping? N N  Preparing Food and eating ? N N  Using the Toilet? N N  In the past six months, have you accidently leaked urine? N N  Do you have  problems with loss of bowel control? N N  Managing your Medications? N N  Managing your Finances? N N  Housekeeping or managing your Housekeeping? N N  Some recent data might be hidden    Patient Care Team: Panosh, Standley Brooking, MD as PCP - General Jettie Booze, MD as PCP - Cardiology (Cardiology) Gatha Mayer, MD (Gastroenterology) Addison Lank, MD as Referring Physician (Dermatology) Josue Hector, MD (Cardiology)  Valinda Party, MD (Rheumatology)  Indicate any recent Medical Services you may have received from other than [facility] providers in the past year (date may be approximate).     Assessment:   This is a routine wellness examination for Cynthia Estrada.  Hearing/Vision screen Hearing Screening - Comments:: No difficulty hearing Vision Screening - Comments:: Wears glasses. Followed by [place] issues and exercise activities discussed: Current Exercise Habits: Home exercise routine, Type of exercise: walking, Time (Minutes): 10, Frequency (Times/Week): 2, Weekly Exercise (Minutes/Week): 20, Intensity: Mild, Exercise limited by: None identified   Goals Addressed               This Visit's Progress     COMPLETED: Patient Stated (pt-stated)        I want to be able to retire completely        Depression Screen PHQ 2/9 Scores 04/05/2021 04/27/2020 01/04/2020 07/04/2017 12/25/2015 11/01/2014 10/29/2013  PHQ - 2 Score 0 0 0 0 0 0 0    Fall Risk Fall Risk  04/05/2021 04/27/2020 01/04/2020 07/04/2017 12/25/2015  Falls in the past year? 0 0 0 No No  Number falls in past yr: 0 0 0 - -  Injury with Fall? 0 0 0 - -  Risk for fall due to : No Fall Risks No Fall Risks - - -  Follow up - Falls evaluation completed;Falls prevention discussed - - -    FALL RISK PREVENTION PERTAINING TO THE HOME:  Any stairs in or around the home? No  If so, are there any without handrails? No  Home free of loose throw rugs in walkways, pet beds, electrical cords, etc? Yes  Adequate  lighting in your home to reduce risk of falls? Yes   ASSISTIVE DEVICES UTILIZED TO PREVENT FALLS:  Life alert? No  Use of a cane, walker or w/c? Yes  Grab bars in the bathroom? No  Shower chair or bench in shower? No  Elevated toilet seat or a handicapped toilet? Yes   TIMED UP AND GO:  Was the test performed? Yes .  Length of time to ambulate 10 feet: 5 sec.   Gait steady fast with use of cane  Cognitive Function:    6CIT Screen 04/05/2021  What Year? 0 points  What month? 0 points  What time? 0 points  Count back from 20 0 points  Months in reverse 0 points  Repeat phrase 0 points  Total Score 0    Immunizations Immunization History  Administered Date(s) Administered   Fluad Quad(high Dose 65+) 12/02/2019   Influenza Split 01/02/2012   Influenza Whole 01/12/2007, 12/02/2007, 01/12/2009   Influenza, High Dose Seasonal PF 11/01/2014, 12/25/2015, 12/19/2016, 01/05/2018   Influenza, Quadrivalent, Recombinant, Inj, Pf 12/01/2018, 12/08/2019   Influenza,inj,Quad PF,6+ Mos 10/27/2012, 12/02/2013   PFIZER(Purple Top)SARS-COV-2 Vaccination 03/09/2019, 03/30/2019   Pneumococcal Conjugate-13 10/29/2013   Pneumococcal Polysaccharide-23 10/16/2009   Td 10/16/2009   Zoster, Live 10/22/2011    TDAP status: Up to date  Flu Vaccine status: Up to date  Pneumococcal vaccine status: Up to date  Covid-19 vaccine status: Declined, Education has been provided regarding the importance of this vaccine but patient still declined. Advised may receive this vaccine at local pharmacy or Health Dept.or vaccine clinic. Aware to provide a copy of the vaccination record if obtained from local pharmacy or Health Dept. Verbalized acceptance and understanding.  Qualifies for Shingles Vaccine? Yes   Zostavax completed No   Shingrix Completed?: No.  Education has been provided regarding the importance of this vaccine. Patient has been advised to call insurance company to determine out of pocket  expense if they have not yet received this vaccine. Advised may also receive vaccine at local pharmacy or Health Dept. Verbalized acceptance and understanding.  Screening Tests Health Maintenance  Topic Date Due   COVID-19 Vaccine (3 - Pfizer risk series) 04/21/2021 (Originally 04/27/2019)   Zoster Vaccines- Shingrix (1 of 2) 07/03/2021 (Originally 03/11/1964)   TETANUS/TDAP  04/05/2022 (Originally 10/17/2019)   COLONOSCOPY (Pts 45-39yrs Insurance coverage will need to be confirmed)  10/19/2021   MAMMOGRAM  01/11/2022   Pneumonia Vaccine 73+ Years old  Completed   INFLUENZA VACCINE  Completed   DEXA SCAN  Completed   Hepatitis C Screening  Completed   HPV VACCINES  Aged Out    Health Maintenance  There are no preventive care reminders to display for this patient.   Colorectal cancer screening: Type of screening: Colonoscopy. Completed 10/20/18. Repeat every 3 years  Mammogram status: Completed 01/11/21. Repeat every year 1  Bone Density status: Completed 7;/15/14. Results reflect: Bone density results: NORMAL. Repeat every 5 years.  Lung Cancer Screening: (Low Dose CT Chest recommended if Age 62-80 years, 30 pack-year currently smoking OR have quit w/in 15years.) does not qualify.    Additional Screening:  Hepatitis C Screening: does qualify; Completed 09/08/12  Vision Screening: Recommended annual ophthalmology exams for early detection of glaucoma and other disorders of the eye. Is the patient up to date with their annual eye exam?  Yes  Who is the provider or what is the name of the office in which the patient attends annual eye exams? [facility] If pt is not established with a provider, would they like to be referred to a provider to establish care? No .   Dental Screening: Recommended annual dental exams for proper oral hygiene  Community Resource Referral / Chronic Care Management:  CRR required this visit?  No   CCM required this visit?  No      Plan:     I  have personally reviewed and noted the following in the patients chart:   Medical and social history Use of alcohol, tobacco or illicit drugs  Current medications and supplements including opioid prescriptions.  Functional ability and status Nutritional status Physical activity Advanced directives List of other physicians Hospitalizations, surgeries, and ER visits in previous 12 months Vitals Screenings to include cognitive, depression, and falls Referrals and appointments  In addition, I have reviewed and discussed with patient certain preventive protocols, quality metrics, and best practice recommendations. A written personalized care plan for preventive services as well as general preventive health recommendations were provided to patient.     Criselda Peaches, LPN   04/29/4560   Nurse Notes: Patient request f/u appt with concerns of Arthritis pain. Appt request made 04/05/21 pending schedule.

## 2021-04-10 ENCOUNTER — Ambulatory Visit (INDEPENDENT_AMBULATORY_CARE_PROVIDER_SITE_OTHER): Payer: Medicare HMO | Admitting: Internal Medicine

## 2021-04-10 ENCOUNTER — Encounter: Payer: Self-pay | Admitting: Internal Medicine

## 2021-04-10 VITALS — BP 130/90 | HR 78 | Temp 98.5°F | Ht 66.0 in | Wt 251.0 lb

## 2021-04-10 DIAGNOSIS — M159 Polyosteoarthritis, unspecified: Secondary | ICD-10-CM

## 2021-04-10 DIAGNOSIS — Z79899 Other long term (current) drug therapy: Secondary | ICD-10-CM

## 2021-04-10 DIAGNOSIS — R7989 Other specified abnormal findings of blood chemistry: Secondary | ICD-10-CM | POA: Diagnosis not present

## 2021-04-10 DIAGNOSIS — I10 Essential (primary) hypertension: Secondary | ICD-10-CM

## 2021-04-10 DIAGNOSIS — Z8739 Personal history of other diseases of the musculoskeletal system and connective tissue: Secondary | ICD-10-CM

## 2021-04-10 DIAGNOSIS — R600 Localized edema: Secondary | ICD-10-CM | POA: Diagnosis not present

## 2021-04-10 DIAGNOSIS — M2550 Pain in unspecified joint: Secondary | ICD-10-CM | POA: Diagnosis not present

## 2021-04-10 DIAGNOSIS — E785 Hyperlipidemia, unspecified: Secondary | ICD-10-CM

## 2021-04-10 LAB — CBC WITH DIFFERENTIAL/PLATELET
Basophils Absolute: 0.1 10*3/uL (ref 0.0–0.1)
Basophils Relative: 1.2 % (ref 0.0–3.0)
Eosinophils Absolute: 0.1 10*3/uL (ref 0.0–0.7)
Eosinophils Relative: 1.3 % (ref 0.0–5.0)
HCT: 39.5 % (ref 36.0–46.0)
Hemoglobin: 12.7 g/dL (ref 12.0–15.0)
Lymphocytes Relative: 26.2 % (ref 12.0–46.0)
Lymphs Abs: 1.4 10*3/uL (ref 0.7–4.0)
MCHC: 32 g/dL (ref 30.0–36.0)
MCV: 82.6 fl (ref 78.0–100.0)
Monocytes Absolute: 0.9 10*3/uL (ref 0.1–1.0)
Monocytes Relative: 16.8 % — ABNORMAL HIGH (ref 3.0–12.0)
Neutro Abs: 2.9 10*3/uL (ref 1.4–7.7)
Neutrophils Relative %: 54.5 % (ref 43.0–77.0)
Platelets: 194 10*3/uL (ref 150.0–400.0)
RBC: 4.79 Mil/uL (ref 3.87–5.11)
RDW: 15.2 % (ref 11.5–15.5)
WBC: 5.3 10*3/uL (ref 4.0–10.5)

## 2021-04-10 LAB — VITAMIN D 25 HYDROXY (VIT D DEFICIENCY, FRACTURES): VITD: 60.69 ng/mL (ref 30.00–100.00)

## 2021-04-10 LAB — SEDIMENTATION RATE: Sed Rate: 14 mm/hr (ref 0–30)

## 2021-04-10 MED ORDER — PANTOPRAZOLE SODIUM 40 MG PO TBEC: 40.0000 mg | DELAYED_RELEASE_TABLET | Freq: Every day | ORAL | 3 refills | Status: DC

## 2021-04-10 NOTE — Progress Notes (Signed)
Chief Complaint  Patient presents with   Back Pain    Pain back moves into feet and toes    HPI: Cynthia Estrada 76 y.o. come in for Chronic disease management  fu last pv 11 22 Today she feels like she is not doing well most recently is having increasing pain and swelling in her lower extremities feet and ankles without a change in diet or medicine.  She is not moving as much because of pain.  He has been having more pain back in the knees feet and toes He also has back pain that comes and goes and somewhat radiating down the back to the front of her leg.  Paina nd sswelling    last weel\and then  more pain  back  feet at toes .  Jerrye Bushy is on [place] but insurance will not continue to pay and we need to change the medicine  Bp she thinks it has been okay in control Knee arthritis pain felt to be degenerative remote history of injection but is bothering her extremely and is now using a cane to get around. Crestor med has been taking it 3 times a week pretty much ROS: See pertinent positives and negatives per HPI. No fever falling unusual rashes. He is taking vitamin D OTC and vitamin B12 supplement Past Medical History:  Diagnosis Date   ANXIETY 11/11/2006   Arthritis    BACK PAIN, LUMBAR 07/11/2008   Cataract    right eye removed    Chest pain 06/12/2010   Ok stress echo has Gi sx also cw gerd.    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   Retinal detachment    SYNCOPE, HX OF 02/10/2007   UNSPECIFIED ARTHROPATHY SITE UNSPECIFIED 01/12/2007   Unspecified vitamin D deficiency 02/10/2007    Family History  Problem Relation Age of Onset   Hypertension Mother     Diabetes Mother    Hypertension Sister    Hypertension Brother    Hyperlipidemia Brother    Colon cancer Neg Hx    Pancreatic cancer Neg Hx    Rectal cancer Neg Hx    Stomach cancer Neg Hx    Colon polyps Neg Hx    Breast cancer Neg Hx     Social History   Socioeconomic History   Marital status: Single    Spouse name: Not on file   Number of children: Not on file   Years of education: Not on file   Highest education level: Not on file  Occupational History   Occupation: Retire    Comment: [employer]  Tobacco Use   Smoking status: Never   Smokeless tobacco: Never  Vaping Use   Vaping Use: Never used  Substance and Sexual Activity   Alcohol use: No   Drug use: No   Sexual activity: Not on file  Other Topics Concern   Not on file  Social History Narrative   HH of 1    No pets   Retired from [employer] walking  onlylimited by knee oa changes    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    Social Determinants of Health   Financial Resource Strain: Low Risk    Difficulty of Paying Living Expenses: Not hard at all  Food Insecurity: No Food Insecurity   Worried About Charity fundraiser in the Last Year: Never true   [place] in the Last Year: Never true  Transportation Needs: No Transportation Needs   Lack of Transportation (Medical): No   Lack of Transportation (Non-Medical): No  Physical Activity: Insufficiently Active   Days of Exercise per Week: 2 days   Minutes of Exercise per Session: 10 min  Stress: No Stress Concern Present   Feeling of Stress : Not at all  Social Connections: Moderately Integrated   Frequency of Communication with Friends and Family: More than three times a week   Frequency of Social Gatherings with Friends and Family: More than three times a week   Attends Religious Services: More than 4 times per year   Active Member of Clubs or Organizations: Yes   Attends Programme researcher, broadcasting/film/video: More than 4 times per year   Marital Status: Never married    Outpatient Medications Prior to Visit  Medication Sig Dispense Refill   Ascorbic Acid (VITAMIN C) 500 MG tablet Take 500 mg by mouth daily.     cholecalciferol (VITAMIN D3) 25 MCG (1000 UNIT) tablet Take 1,000 Units by mouth daily.     cyanocobalamin 100 MCG tablet Take 100 mcg by mouth as needed.     ezetimibe (ZETIA) 10 MG tablet Take 1 tablet by mouth once daily 90 tablet 0   hydrochlorothiazide (MICROZIDE) 12.5 MG capsule Take 1 capsule (12.5 mg total) by mouth daily. 90 capsule 2   losartan (COZAAR) 50 MG tablet Take 1 tablet (50 mg total) by mouth daily. 90 tablet 2   rosuvastatin (CRESTOR) 5 MG tablet Take 1 by mouth 2 x per week  can increase to 3 x per week as tolerated 45 tablet 3   ULORIC 40 MG tablet Take 40 mg by mouth daily.      DEXILANT 60 MG capsule Take 1 capsule (60 mg total) by mouth daily. 90 capsule 0   allopurinol (ZYLOPRIM) 100 MG tablet Take 100 mg by mouth daily. (Patient not taking: Reported on 04/10/2021)     Facility-Administered Medications Prior to Visit  Medication Dose Route Frequency Provider Last Rate Last Admin   technetium tetrofosmin (TC-MYOVIEW) injection 82.4 millicurie  23.5 millicurie Intravenous Once PRN Fay Records, MD         EXAM:  BP 130/90 (BP Location: Left Arm, Patient Position: Sitting, Cuff Size: Normal)    Pulse 78    Temp 98.5 F (36.9 C) (Oral)    Ht 5\' 6"  (1.676 m)    Wt 251 lb (113.9 kg)    SpO2 100%    BMI 40.51 kg/m   Body mass index is 40.51 kg/m.  GENERAL: vitals reviewed and listed above, alert, oriented, appears well hydrated and in no acute distress appears uncomfortable HEENT: atraumatic, conjunctiva  clear, no obvious abnormalities on inspection of external nose and ears OP : Masked NECK: no obvious masses on inspection palpation  LUNGS: clear to auscultation bilaterally, no wheezes, rales or rhonchi, good air movement CV: HRRR, no  clubbing cyanosis lower extremity plus 2+ edema left greater than right feet have normal perfusion no acute joint swelling in the feet no ulcers noted MS: moves all extremities left knee is painful but no  obvious large effusion PSYCH: pleasant and cooperative, no obvious depression or anxiety Lab Results  Component Value Date   WBC 4.3 01/22/2021   HGB 13.1 01/22/2021   HCT 40 01/22/2021   PLT 181 01/22/2021   GLUCOSE 110 (H) 01/04/2021   CHOL 226 (H) 01/04/2021   TRIG 159.0 (H) 01/04/2021   HDL 65.80 01/04/2021   LDLDIRECT 171.0 12/20/2015   LDLCALC 128 (H) 01/04/2021   ALT 37 (A) 01/22/2021   AST 27 01/22/2021   NA 139 01/22/2021   K 4.9 01/22/2021   CL 103 01/22/2021   CREATININE 1.3 (A) 01/22/2021   BUN 10 01/22/2021   CO2 29 (A) 01/22/2021   TSH 1.61 07/04/2017   HGBA1C 6.3 01/04/2021   BP Readings from Last 3 Encounters:  04/10/21 130/90  04/05/21 130/68  01/09/21 138/82   Lab Results  Component Value Date   VITAMINB12 585 01/15/2006  Is taking otc vit d and b12   ASSESSMENT AND PLAN:  Discussed the following assessment and plan:  Edema of both lower legs - Plan: CBC with Differential/Platelet, Sedimentation rate, C-reactive protein, Uric Acid, Comprehensive metabolic panel, Rheumatoid Factor, Cyclic citrul peptide antibody, IgG, Cyclic citrul peptide antibody, IgG, Rheumatoid Factor, Comprehensive metabolic panel, Uric Acid, C-reactive protein, Sedimentation rate, CBC with Differential/Platelet  Osteoarthritis of multiple joints, unspecified osteoarthritis type - Plan: CBC with Differential/Platelet, Sedimentation rate, C-reactive protein, Uric Acid, Comprehensive metabolic panel, Rheumatoid Factor, Cyclic citrul peptide antibody, IgG, Cyclic citrul peptide antibody, IgG, Rheumatoid Factor, Comprehensive metabolic panel, Uric Acid, C-reactive protein, Sedimentation rate, CBC with Differential/Platelet  Multiple joint pain - Plan: CBC with Differential/Platelet,  Sedimentation rate, C-reactive protein, Uric Acid, Comprehensive metabolic panel, Rheumatoid Factor, Cyclic citrul peptide antibody, IgG, Cyclic citrul peptide antibody, IgG, Rheumatoid Factor, Comprehensive metabolic panel, Uric Acid, C-reactive protein, Sedimentation rate, CBC with Differential/Platelet  History of gout - Plan: CBC with Differential/Platelet, Sedimentation rate, C-reactive protein, Uric Acid, Comprehensive metabolic panel, Rheumatoid Factor, Cyclic citrul peptide antibody, IgG, Cyclic citrul peptide antibody, IgG, Rheumatoid Factor, Comprehensive metabolic panel, Uric Acid, C-reactive protein, Sedimentation rate, CBC with Differential/Platelet  Medication management - try protonix   instead of dexilant - Plan: CBC with Differential/Platelet, Sedimentation rate, C-reactive protein, Uric Acid, Comprehensive metabolic panel, Rheumatoid Factor, Cyclic citrul peptide antibody, IgG, Lipid panel, Lipid panel, Cyclic citrul peptide antibody, IgG, Rheumatoid Factor, Comprehensive metabolic panel, Uric Acid, C-reactive protein, Sedimentation rate, CBC with Differential/Platelet  Essential hypertension - Plan: CBC with Differential/Platelet, Sedimentation rate, C-reactive protein, Uric Acid, Comprehensive metabolic panel, Rheumatoid Factor, Cyclic citrul peptide antibody, IgG, Lipid panel, Lipid panel, Cyclic citrul peptide antibody, IgG, Rheumatoid Factor, Comprehensive metabolic panel, Uric Acid, C-reactive protein, Sedimentation rate, CBC with Differential/Platelet  Hyperlipidemia, unspecified hyperlipidemia type - Plan: Lipid panel, Lipid panel  Low serum vitamin D - Plan: VITAMIN D 25 Hydroxy (Vit-D Deficiency, Fractures), VITAMIN D 25 Hydroxy (Vit-D Deficiency, Fractures) So she is not doing well today in regard to her pain and new onset increased swelling in her lower extremities question from inactivity diet change update labs today.  Consider short course of diuretic.  In addition to  compression stockings or socks I will update her labs with inflammatory markers as something is different or worse and then she has an upcoming appointment with rheumatology would asked them to evaluate.  Maybe she would be helpful for evaluation by Ortho uncertain if she has end-stage knee arthritis although she is not particularly interested in surgery. -Patient advised to return or notify health care team  if  new concerns arise. Fu depnding  possible  3 mos   May consider  ortho opinion about the knee  esp right    Review evaluation cancel all in regard to her increasing symptoms plan of lab reassessment by rheumatology.  40 minutes  She can take Tylenol 500 mg 2-3 times a day as needed in the interim. Patient Instructions  Lab today  and will share with  rhumatology  information and ask their advice  about  pain control   We may add a short course of  more powerful diuretic  for the swelling.  Advise FU depending  on results .  Changed the dexilant to protonix  trial   Mariann Laster K. Rael Yo M.D.

## 2021-04-10 NOTE — Patient Instructions (Signed)
Lab today  and will share with  rhumatology  information and ask their advice  about  pain control   We may add a short course of  more powerful diuretic  for the swelling.  Advise FU depending  on results .  Changed the dexilant to protonix  trial

## 2021-04-11 LAB — LIPID PANEL
Cholesterol: 181 mg/dL (ref 0–200)
HDL: 76 mg/dL (ref >39.00)
LDL Cholesterol: 78 mg/dL (ref 0–99)
NonHDL: 104.99
Total CHOL/HDL Ratio: 2
Triglycerides: 133 mg/dL (ref 0.0–149.0)
VLDL: 26.6 mg/dL (ref 0.0–40.0)

## 2021-04-11 LAB — COMPREHENSIVE METABOLIC PANEL
ALT: 18 U/L (ref 0–35)
AST: 21 U/L (ref 0–37)
Albumin: 4.4 g/dL (ref 3.5–5.2)
Alkaline Phosphatase: 63 U/L (ref 39–117)
BUN: 10 mg/dL (ref 6–23)
CO2: 28 mEq/L (ref 19–32)
Calcium: 10.2 mg/dL (ref 8.4–10.5)
Chloride: 102 mEq/L (ref 96–112)
Creatinine, Ser: 1.23 mg/dL — ABNORMAL HIGH (ref 0.40–1.20)
GFR: 42.51 mL/min — ABNORMAL LOW (ref >60.00)
Glucose, Bld: 85 mg/dL (ref 70–99)
Potassium: 3.9 mEq/L (ref 3.5–5.1)
Sodium: 139 mEq/L (ref 135–145)
Total Bilirubin: 0.6 mg/dL (ref 0.2–1.2)
Total Protein: 6.8 g/dL (ref 6.0–8.3)

## 2021-04-11 LAB — RHEUMATOID FACTOR: Rheumatoid fact SerPl-aCnc: <14 IU/mL (ref <14)

## 2021-04-11 LAB — URIC ACID: Uric Acid, Serum: 4.3 mg/dL (ref 2.4–7.0)

## 2021-04-11 LAB — C-REACTIVE PROTEIN: CRP: <1 mg/dL (ref 0.5–20.0)

## 2021-04-11 LAB — CYCLIC CITRUL PEPTIDE ANTIBODY, IGG: Cyclic Citrullin Peptide Ab: <16 UNITS

## 2021-04-26 ENCOUNTER — Telehealth: Payer: Self-pay | Admitting: Internal Medicine

## 2021-04-26 MED ORDER — FUROSEMIDE 20 MG PO TABS: ORAL_TABLET | ORAL | 0 refills | Status: DC

## 2021-04-26 NOTE — Addendum Note (Signed)
Addended by: Nilda Riggs on: 04/26/2021 11:54 AM   Modules accepted: Orders

## 2021-04-26 NOTE — Telephone Encounter (Signed)
Please see result note 

## 2021-04-26 NOTE — Progress Notes (Signed)
So  results   normal inflammation markers  cholesterol vit d  potassium and no anemia . No further explanation for  the swelling and increase joints pains. If still swelling  we can add furosemide 20 mg per day for 10 -days   and then let us know how doing .  ( If is helpful  and we decide to stay on  furosemide . would have to check potassium level) Advise you make a follow up with rheumatology  about the  pains.   Make a follow up visit  with [place] in 1-2 months   Please send in furosemide  20 mg 1 po qd for  10  days and then as directed ( disp 30 no refills)

## 2021-04-26 NOTE — Telephone Encounter (Signed)
Pt is calling and would like blood work results 

## 2021-04-26 NOTE — Addendum Note (Signed)
Addended by: Nilda Riggs on: 04/26/2021 11:56 AM   Modules accepted: Orders

## 2021-04-29 ENCOUNTER — Other Ambulatory Visit: Payer: Self-pay | Admitting: Internal Medicine

## 2021-05-03 DIAGNOSIS — M109 Gout, unspecified: Secondary | ICD-10-CM | POA: Diagnosis not present

## 2021-05-03 DIAGNOSIS — M79671 Pain in right foot: Secondary | ICD-10-CM | POA: Diagnosis not present

## 2021-05-03 DIAGNOSIS — M1990 Unspecified osteoarthritis, unspecified site: Secondary | ICD-10-CM | POA: Diagnosis not present

## 2021-05-03 DIAGNOSIS — M25561 Pain in right knee: Secondary | ICD-10-CM | POA: Diagnosis not present

## 2021-05-03 DIAGNOSIS — M25562 Pain in left knee: Secondary | ICD-10-CM | POA: Diagnosis not present

## 2021-05-03 DIAGNOSIS — I10 Essential (primary) hypertension: Secondary | ICD-10-CM | POA: Diagnosis not present

## 2021-05-03 DIAGNOSIS — R768 Other specified abnormal immunological findings in serum: Secondary | ICD-10-CM | POA: Diagnosis not present

## 2021-05-03 DIAGNOSIS — Z79899 Other long term (current) drug therapy: Secondary | ICD-10-CM | POA: Diagnosis not present

## 2021-05-04 LAB — BASIC METABOLIC PANEL
BUN: 13 (ref 4–21)
CO2: 29 — AB (ref 13–22)
Chloride: 103 (ref 99–108)
Glucose: 82
Potassium: 4.9 mEq/L (ref 3.5–5.1)
Sodium: 139 (ref 137–147)

## 2021-05-04 LAB — CBC AND DIFFERENTIAL
HCT: 40 (ref 36–46)
Hemoglobin: 13.1 (ref 12.0–16.0)
Platelets: 181 10*3/uL (ref 150–400)
WBC: 4.3

## 2021-05-04 LAB — COMPREHENSIVE METABOLIC PANEL
Albumin: 4.4 (ref 3.5–5.0)
Calcium: 9.8 (ref 8.7–10.7)
eGFR: 42.19

## 2021-05-08 ENCOUNTER — Encounter: Payer: Self-pay | Admitting: Internal Medicine

## 2021-05-28 ENCOUNTER — Other Ambulatory Visit: Payer: Self-pay | Admitting: Internal Medicine

## 2021-05-29 NOTE — Telephone Encounter (Signed)
Last OV 04/10/21 notes by provider:  ?new onset increased swelling in her lower extremities question from inactivity diet change update labs today.  Consider short course of diuretic.  In addition to compression stockings or socks ? ?So  results   normal inflammation markers  cholesterol vit d  potassium and no anemia . No further explanation for  the swelling and increase joints pains. If still swelling  we can add furosemide 20 mg per day for 10 -days   and then let us know how doing .  ( If is helpful  and we decide to stay on furosemide . would have to check potassium level).   ?Make a follow up visit  with [place] in 1-2 months   ?Please send in furosemide  20 mg 1 po qd for  10  days and then as directed ( disp 30 no refills) ? ?Pt needs f/u to determine diuretic needs as noted in the 04/10/21 notes. Pt notified of this, f/u appt made for 06/11/21. ?

## 2021-05-31 DIAGNOSIS — I10 Essential (primary) hypertension: Secondary | ICD-10-CM | POA: Diagnosis not present

## 2021-05-31 DIAGNOSIS — M109 Gout, unspecified: Secondary | ICD-10-CM | POA: Diagnosis not present

## 2021-05-31 DIAGNOSIS — R768 Other specified abnormal immunological findings in serum: Secondary | ICD-10-CM | POA: Diagnosis not present

## 2021-05-31 DIAGNOSIS — M1990 Unspecified osteoarthritis, unspecified site: Secondary | ICD-10-CM | POA: Diagnosis not present

## 2021-05-31 DIAGNOSIS — M25561 Pain in right knee: Secondary | ICD-10-CM | POA: Diagnosis not present

## 2021-05-31 DIAGNOSIS — Z79899 Other long term (current) drug therapy: Secondary | ICD-10-CM | POA: Diagnosis not present

## 2021-06-08 IMAGING — MG DIGITAL SCREENING BILAT W/ CAD
5 series · 5 of 5 positions shown · non-contrast
Comparison: Previous exam(s).

CLINICAL DATA: Screening.

EXAM:
DIGITAL SCREENING BILATERAL MAMMOGRAM WITH CAD

[R CC]
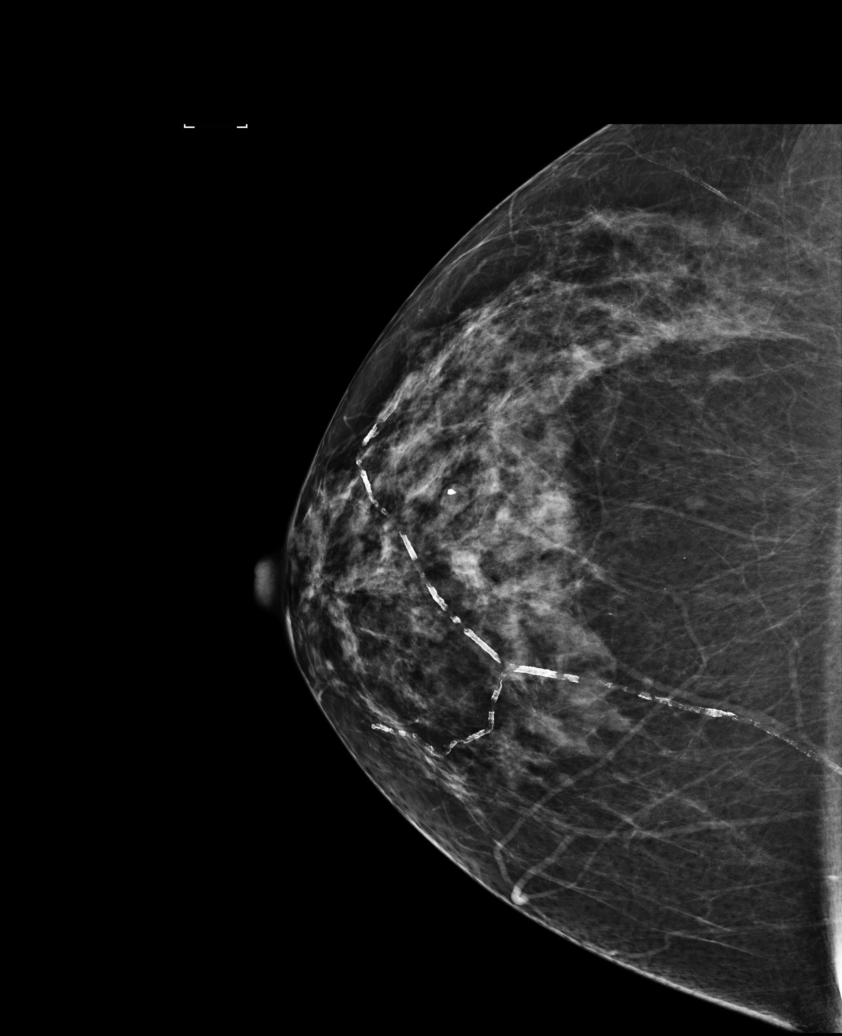

[R MLO (1 of 2)]
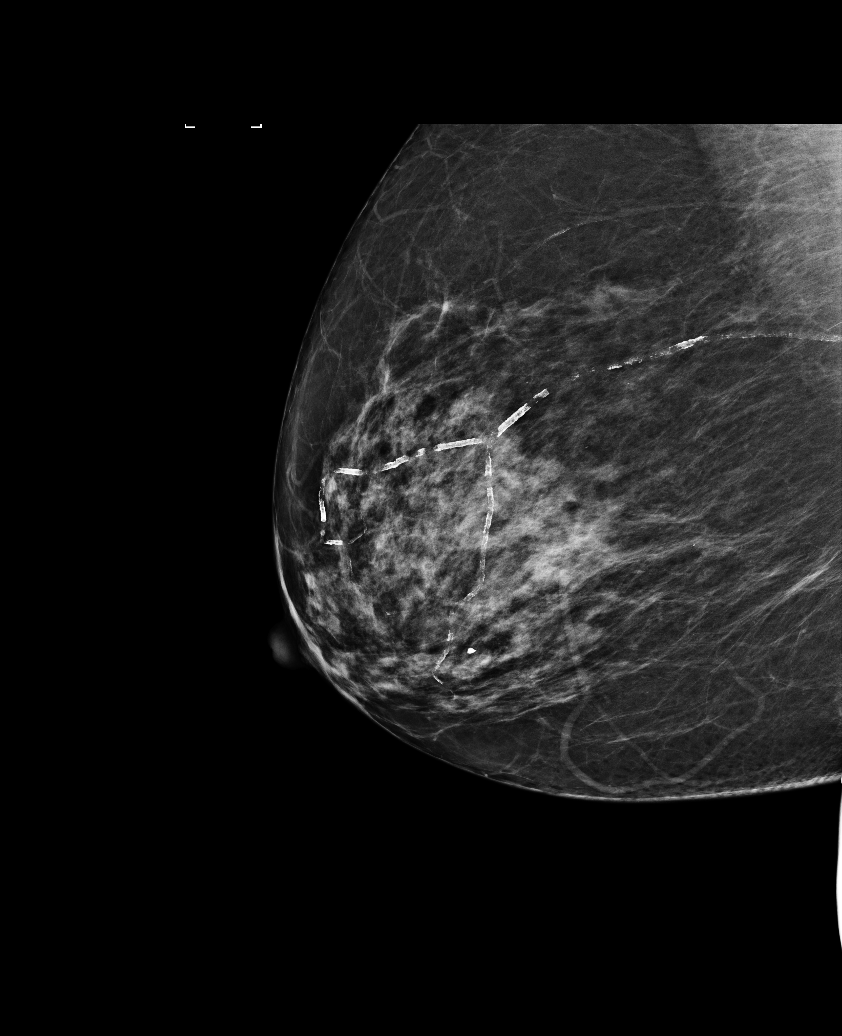

[L CC]
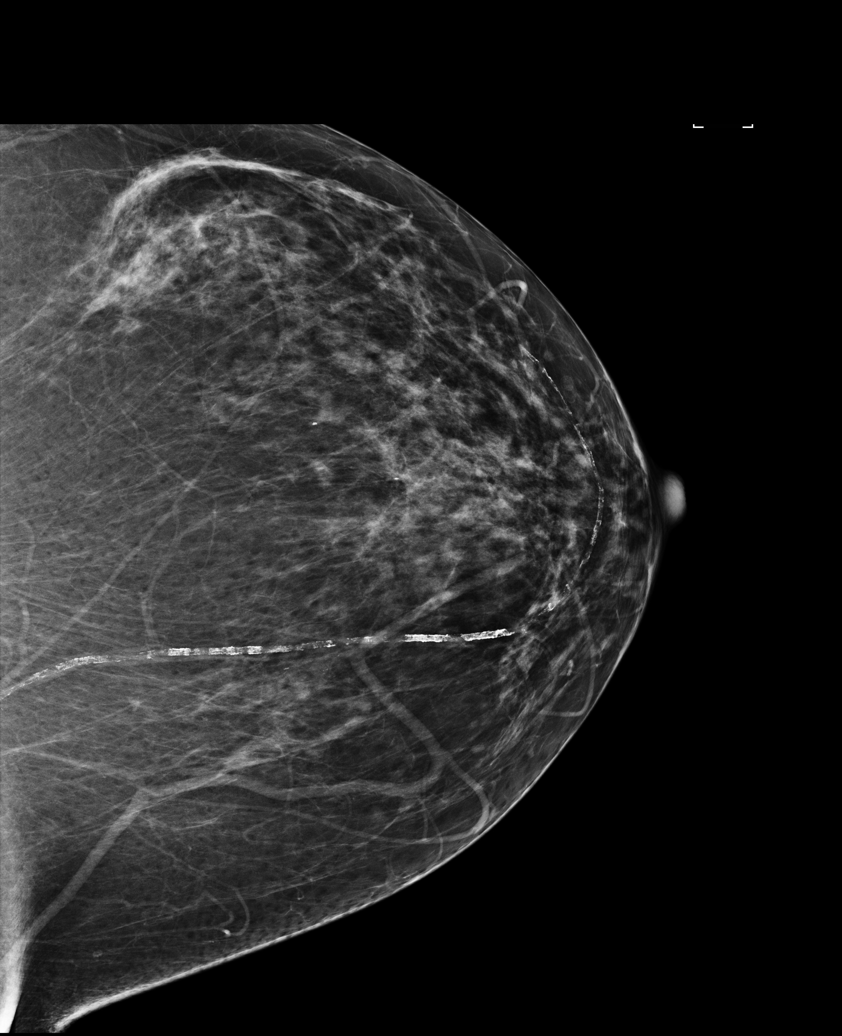

[L MLO]
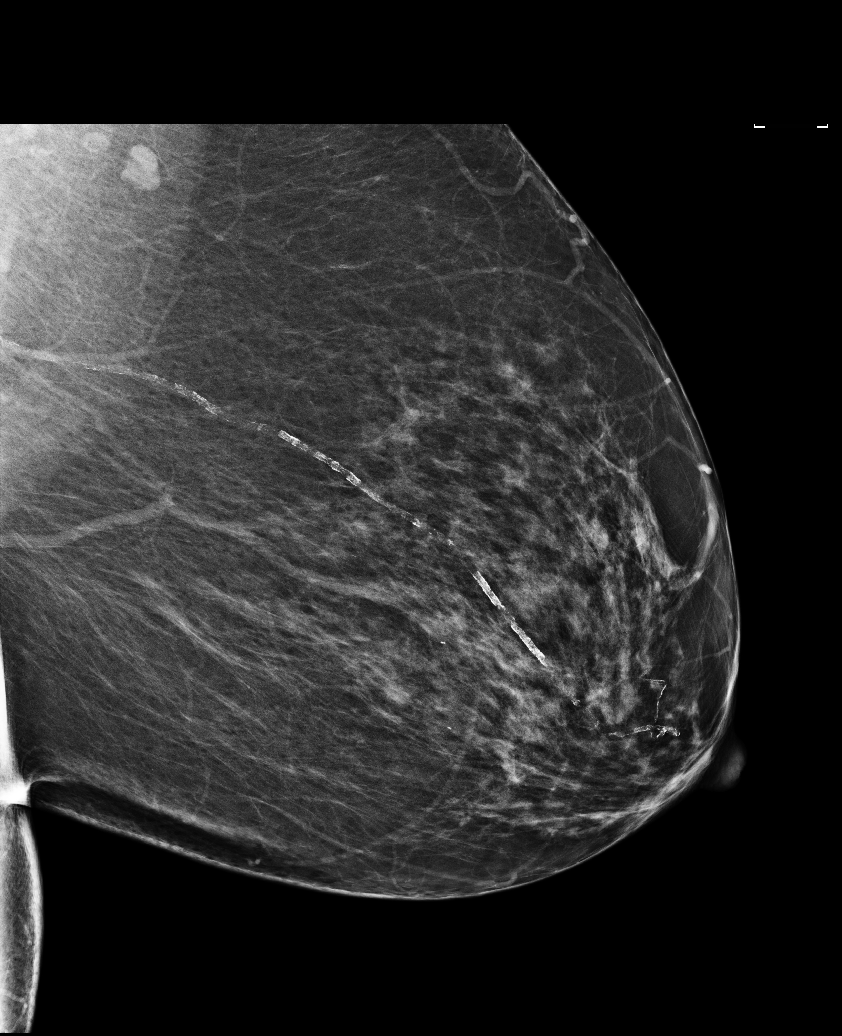

[R MLO (2 of 2)]
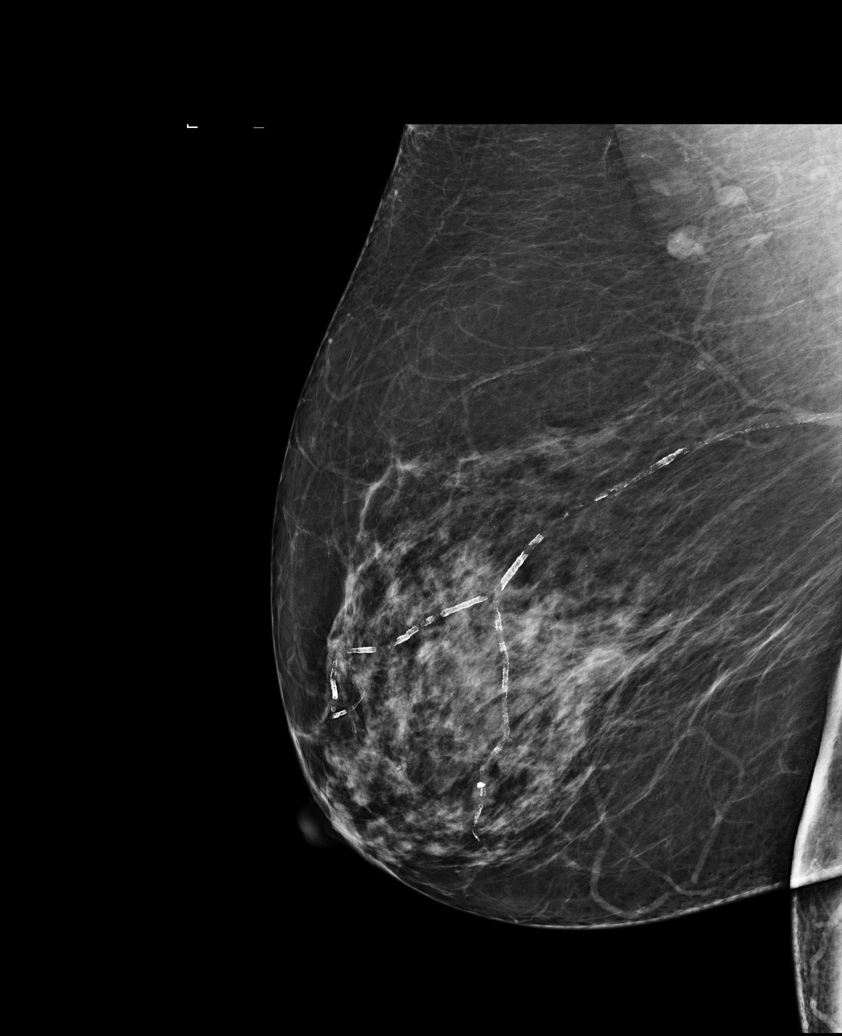

[5 of 5 positions shown; findings below may reference images not displayed]

ACR Breast Density Category c: The breast tissue is heterogeneously
dense, which may obscure small masses.
FINDINGS: There are no findings suspicious for malignancy. Images were
processed with CAD.
IMPRESSION: No mammographic evidence of malignancy. A result letter of this
screening mammogram will be mailed directly to the patient.

RECOMMENDATION:
Screening mammogram in one year. (Code:[id number]-[id number])

BI-RADS CATEGORY  1: Negative.

## 2021-06-11 ENCOUNTER — Other Ambulatory Visit: Payer: Self-pay | Admitting: Internal Medicine

## 2021-06-11 ENCOUNTER — Ambulatory Visit (INDEPENDENT_AMBULATORY_CARE_PROVIDER_SITE_OTHER): Payer: Medicare HMO | Admitting: Internal Medicine

## 2021-06-11 ENCOUNTER — Encounter: Payer: Self-pay | Admitting: Internal Medicine

## 2021-06-11 VITALS — BP 122/82 | HR 90 | Temp 98.4°F | Ht 66.0 in | Wt 249.4 lb

## 2021-06-11 DIAGNOSIS — R600 Localized edema: Secondary | ICD-10-CM | POA: Diagnosis not present

## 2021-06-11 DIAGNOSIS — I10 Essential (primary) hypertension: Secondary | ICD-10-CM

## 2021-06-11 DIAGNOSIS — Z8739 Personal history of other diseases of the musculoskeletal system and connective tissue: Secondary | ICD-10-CM

## 2021-06-11 DIAGNOSIS — Z79899 Other long term (current) drug therapy: Secondary | ICD-10-CM | POA: Diagnosis not present

## 2021-06-11 MED ORDER — FUROSEMIDE 20 MG PO TABS
20.0000 mg | ORAL_TABLET | Freq: Every day | ORAL | 1 refills | Status: DC
Start: 2021-06-11 — End: 2022-01-22

## 2021-06-11 NOTE — Progress Notes (Signed)
Not taking allopurinol ?

## 2021-06-11 NOTE — Patient Instructions (Addendum)
Stop the hydrochlorothiazide  for now. ? ?Restart the furosemide every day .  ? ?In 3-4 weeks  check BMP to dheck potassium level  ( dont have to fast)  ?If all ok will continue. Same  ?

## 2021-06-11 NOTE — Progress Notes (Signed)
? ?Chief Complaint  ?Patient presents with  ? Medication Refill  ? ? ?HPI: ?Cynthia Estrada 76 y.o. come in for Chronic disease management  ? ?Fu le edema   lasix helped but  off med for a week or so  ?Saw dr Dossie Der x 2 and felt to have gout djd obesity and  renal infusfficiency pos RF non diagnostic  ?Cr was  1.36  and potassium 3.7 when on lasix  :  thinks it helped  the swelling at ankles  ? ?No new dx feels stable  ?ROS: See pertinent positives and negatives per HPI. ? ?Past Medical History:  ?Diagnosis Date  ? ANXIETY 11/11/2006  ? Arthritis   ? BACK PAIN, LUMBAR 07/11/2008  ? Cataract   ? right eye removed   ? Chest pain 06/12/2010  ? Ok stress echo has Gi sx also cw gerd.   ? FASTING HYPERGLYCEMIA 01/12/2007  ? GERD (gastroesophageal reflux disease)   ? High cholesterol   ? History of CT scan of chest   ? History of varicella   ? HOT FLASHES 04/07/2008  ? HYPERLIPIDEMIA 01/12/2007  ? HYPERTENSION 11/11/2006  ? LEG CRAMPS 01/12/2009  ? Normal stress echocardiogram 2012  ? 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 Retinal detachment   ? SYNCOPE, HX OF 02/10/2007  ? UNSPECIFIED ARTHROPATHY SITE UNSPECIFIED 01/12/2007  ? Unspecified vitamin D deficiency 02/10/2007  ? ? ?Family History  ?Problem Relation Age of Onset  ? Hypertension Mother   ? Diabetes Mother   ? Hypertension Sister   ? Hypertension Brother   ? Hyperlipidemia Brother   ? Colon cancer Neg Hx   ? Pancreatic cancer Neg Hx   ? Rectal cancer Neg Hx   ? Stomach cancer Neg Hx   ? Colon polyps Neg Hx   ? Breast cancer Neg Hx   ? ? ?Social History  ? ?Socioeconomic History  ? Marital status: Single  ?  Spouse name: Not on file  ? Number of children: Not on file  ? Years of education: Not on file  ? Highest education level: Not on file  ?Occupational History  ? Occupation: Retire  ?  Comment: [employer]  ?Tobacco Use  ? Smoking status: Never  ? Smokeless tobacco: Never  ?Vaping Use  ? Vaping Use: Never used  ?Substance and Sexual  Activity  ? Alcohol use: No  ? Drug use: No  ? Sexual activity: Not on file  ?Other Topics Concern  ? Not on file  ?Social History Narrative  ? HH of 1   ? No pets  ? Retired from [employer]  ? Exercises walking  onlylimited by knee oa changes   ? 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 2 clients   Alzheimer and brain cancer patient.   ? ?Social Determinants of Health  ? ?Financial Resource Strain: Low Risk   ? Difficulty of Paying Living Expenses: Not hard at all  ?Food Insecurity: Unknown  ? Worried About Charity fundraiser in the Last Year: Never true  ? Ran Out of Food in the Last Year: Not on file  ?Transportation Needs: No Transportation Needs  ? Lack of Transportation (Medical): No  ? Lack of Transportation (Non-Medical): No  ?Physical Activity: Insufficiently Active  ? Days of Exercise per Week: 2 days  ? Minutes of Exercise per Session: 10 min  ?Stress: No Stress Concern Present  ? Feeling of Stress : Not at all  ?  Social Connections: Moderately Integrated  ? Frequency of Communication with Friends and Family: More than three times a week  ? Frequency of Social Gatherings with Friends and Family: More than three times a week  ? Attends Religious Services: More than 4 times per year  ? Active Member of Clubs or Organizations: Yes  ? Attends Archivist Meetings: More than 4 times per year  ? Marital Status: Never married  ? ? ?Outpatient Medications Prior to Visit  ?Medication Sig Dispense Refill  ? allopurinol (ZYLOPRIM) 100 MG tablet Take 100 mg by mouth daily.    ? Ascorbic Acid (VITAMIN C) 500 MG tablet Take 500 mg by mouth daily.    ? cholecalciferol (VITAMIN D3) 25 MCG (1000 UNIT) tablet Take 1,000 Units by mouth daily.    ? cyanocobalamin 100 MCG tablet Take 100 mcg by mouth as needed.    ? ezetimibe (ZETIA) 10 MG tablet Take 1 tablet (10 mg total) by mouth daily. 90 tablet 1  ? losartan (COZAAR) 50 MG tablet Take 1 tablet by mouth once daily 90 tablet 0  ? pantoprazole (PROTONIX) 40 MG  tablet Take 1 tablet (40 mg total) by mouth daily. Instead of dexilant 30 tablet 3  ? rosuvastatin (CRESTOR) 5 MG tablet Take 1 by mouth 2 x per week  can increase to 3 x per week as tolerated 45 tablet 3  ? ULORIC 40 MG tablet Take 40 mg by mouth daily.     ? furosemide (LASIX) 20 MG tablet Take 1 tablet (20 mg total) by mouth daily for 10  days and then as directed 30 tablet 0  ? hydrochlorothiazide (MICROZIDE) 12.5 MG capsule Take 1 capsule (12.5 mg total) by mouth daily. 90 capsule 1  ? ?Facility-Administered Medications Prior to Visit  ?Medication Dose Route Frequency Provider Last Rate Last Admin  ? technetium tetrofosmin (TC-MYOVIEW) injection 96.0 millicurie  45.4 millicurie Intravenous Once PRN Fay Records, MD      ? ? ? ?EXAM: ? ?BP 122/82 (BP Location: Left Arm, Patient Position: Sitting, Cuff Size: Normal)   Pulse 90   Temp 98.4 ?F (36.9 ?C) (Oral)   Ht '5\' 6"'$  (1.676 m)   Wt 249 lb 6.4 oz (113.1 kg)   SpO2 99%   BMI 40.25 kg/m?  ? ?Body mass index is 40.25 kg/m?. ? ?GENERAL: vitals reviewed and listed above, alert, oriented, appears well hydrated and in no acute distress ?HEENT: atraumatic, conjunctiva  clear, no obvious abnormalities on inspection of external nose and ears NECK: no obvious masses on inspection palpation  ?LUNGS: clear to auscultation bilaterally, no wheezes, rales or rhonchi, ?CV: HRRR, no clubbing cyanosis o edema 1 +  ? ?PSYCH: pleasant and cooperative, no obvious depression or anxiety ?Lab Results  ?Component Value Date  ? WBC 4.3 05/04/2021  ? HGB 13.1 05/04/2021  ? HCT 40 05/04/2021  ? PLT 181 05/04/2021  ? GLUCOSE 85 04/10/2021  ? CHOL 181 04/10/2021  ? TRIG 133.0 04/10/2021  ? HDL 76.00 04/10/2021  ? LDLDIRECT 171.0 12/20/2015  ? [place] 78 04/10/2021  ? ALT 18 04/10/2021  ? AST 21 04/10/2021  ? NA 139 05/04/2021  ? K 4.9 05/04/2021  ? CL 103 05/04/2021  ? CREATININE 1.23 (H) 04/10/2021  ? BUN 13 05/04/2021  ? CO2 29 (A) 05/04/2021  ? TSH 1.61 07/04/2017  ? HGBA1C 6.3  01/04/2021  ? ?BP Readings from Last 3 Encounters:  ?06/11/21 122/82  ?04/10/21 130/90  ?04/05/21 130/68  ? ?  Dr Dossie Der  note reviewed with lab   ?ASSESSMENT AND PLAN: ? ?Discussed the following assessment and plan: ? ?Edema of both lower legs - Plan: Basic Metabolic Panel ? ?Medication management - Plan: Basic Metabolic Panel ? ?Essential hypertension ? ?History of gout ?Stop the hctz and restart the lasix as seems to be helpful. Plan bmp in 3-4 weeks to  assure potassium level in range  ?-Patient advised to return or notify health care team  if  new concerns arise. ? ?Patient Instructions  ?Stop the hydrochlorothiazide  for now. ? ?Restart the furosemide every day .  ? ?In 3-4 weeks  check BMP to dheck potassium level  ( dont have to fast)  ?If all ok will continue. Same  ? ? ?Standley Brooking. Baley Shands M.D. ?

## 2021-07-02 ENCOUNTER — Ambulatory Visit (INDEPENDENT_AMBULATORY_CARE_PROVIDER_SITE_OTHER): Payer: Medicare HMO | Admitting: Internal Medicine

## 2021-07-02 ENCOUNTER — Encounter: Payer: Self-pay | Admitting: Internal Medicine

## 2021-07-02 VITALS — BP 130/80 | HR 64 | Temp 98.5°F | Ht 66.0 in | Wt 253.6 lb

## 2021-07-02 DIAGNOSIS — M79605 Pain in left leg: Secondary | ICD-10-CM | POA: Diagnosis not present

## 2021-07-02 DIAGNOSIS — R600 Localized edema: Secondary | ICD-10-CM | POA: Diagnosis not present

## 2021-07-02 DIAGNOSIS — Z79899 Other long term (current) drug therapy: Secondary | ICD-10-CM | POA: Diagnosis not present

## 2021-07-02 DIAGNOSIS — I10 Essential (primary) hypertension: Secondary | ICD-10-CM

## 2021-07-02 LAB — BASIC METABOLIC PANEL
BUN: 12 mg/dL (ref 6–23)
CO2: 27 mEq/L (ref 19–32)
Calcium: 9.9 mg/dL (ref 8.4–10.5)
Chloride: 103 mEq/L (ref 96–112)
Creatinine, Ser: 1.29 mg/dL — ABNORMAL HIGH (ref 0.40–1.20)
GFR: 40.09 mL/min — ABNORMAL LOW (ref >60.00)
Glucose, Bld: 104 mg/dL — ABNORMAL HIGH (ref 70–99)
Potassium: 3.6 mEq/L (ref 3.5–5.1)
Sodium: 139 mEq/L (ref 135–145)

## 2021-07-02 LAB — MAGNESIUM: Magnesium: 1.8 mg/dL (ref 1.5–2.5)

## 2021-07-02 NOTE — Patient Instructions (Addendum)
Lab today to check potassium levels since you are taking the  fluid pill every day .  ?You will be contacted about getting ultrasound of left leg   to  ,make sure no  obstruction such as a blood clot .causing the sx you  had this weekend.  ? ?Plan follow up depending   ? ?Or 3 months  ?

## 2021-07-02 NOTE — Progress Notes (Signed)
? ?Chief Complaint  ?Patient presents with  ? Follow-up  ? ? ?HPI: ?Cynthia Estrada 76 y.o. come in for ccm follow up  ?Now on daily lasix and "seems to be doing better"    ?However this weekend  had right lower leg paibn she thought was  gout  and took her gout medicine   seems getting better . ?No other se  noted cramps cp sob . ?ROS: See pertinent positives and negatives per HPI. ? ?Past Medical History:  ?Diagnosis Date  ? ANXIETY 11/11/2006  ? Arthritis   ? BACK PAIN, LUMBAR 07/11/2008  ? Cataract   ? right eye removed   ? Chest pain 06/12/2010  ? Ok stress echo has Gi sx also cw gerd.   ? FASTING HYPERGLYCEMIA 01/12/2007  ? GERD (gastroesophageal reflux disease)   ? High cholesterol   ? History of CT scan of chest   ? History of varicella   ? HOT FLASHES 04/07/2008  ? HYPERLIPIDEMIA 01/12/2007  ? HYPERTENSION 11/11/2006  ? LEG CRAMPS 01/12/2009  ? Normal stress echocardiogram 2012  ? 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 Retinal detachment   ? SYNCOPE, HX OF 02/10/2007  ? UNSPECIFIED ARTHROPATHY SITE UNSPECIFIED 01/12/2007  ? Unspecified vitamin D deficiency 02/10/2007  ? ? ?Family History  ?Problem Relation Age of Onset  ? Hypertension Mother   ? Diabetes Mother   ? Hypertension Sister   ? Hypertension Brother   ? Hyperlipidemia Brother   ? Colon cancer Neg Hx   ? Pancreatic cancer Neg Hx   ? Rectal cancer Neg Hx   ? Stomach cancer Neg Hx   ? Colon polyps Neg Hx   ? Breast cancer Neg Hx   ? ? ?Social History  ? ?Socioeconomic History  ? Marital status: Single  ?  Spouse name: Not on file  ? Number of children: Not on file  ? Years of education: Not on file  ? Highest education level: Not on file  ?Occupational History  ? Occupation: Retire  ?  Comment: [employer]  ?Tobacco Use  ? Smoking status: Never  ? Smokeless tobacco: Never  ?Vaping Use  ? Vaping Use: Never used  ?Substance and Sexual Activity  ? Alcohol use: No  ? Drug use: No  ? Sexual activity: Not on file  ?Other Topics  Concern  ? Not on file  ?Social History Narrative  ? HH of 1   ? No pets  ? Retired from [employer]  ? Exercises walking  onlylimited by knee oa changes   ? 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 2 clients   Alzheimer and brain cancer patient.   ? ?Social Determinants of Health  ? ?Financial Resource Strain: Low Risk   ? Difficulty of Paying Living Expenses: Not hard at all  ?Food Insecurity: Unknown  ? Worried About Charity fundraiser in the Last Year: Never true  ? Ran Out of Food in the Last Year: Not on file  ?Transportation Needs: No Transportation Needs  ? Lack of Transportation (Medical): No  ? Lack of Transportation (Non-Medical): No  ?Physical Activity: Insufficiently Active  ? Days of Exercise per Week: 2 days  ? Minutes of Exercise per Session: 10 min  ?Stress: No Stress Concern Present  ? Feeling of Stress : Not at all  ?Social Connections: Moderately Integrated  ? Frequency of Communication with Friends and Family: More than three times a week  ?  Frequency of Social Gatherings with Friends and Family: More than three times a week  ? Attends Religious Services: More than 4 times per year  ? Active Member of Clubs or Organizations: Yes  ? Attends Archivist Meetings: More than 4 times per year  ? Marital Status: Never married  ? ? ?Outpatient Medications Prior to Visit  ?Medication Sig Dispense Refill  ? Ascorbic Acid (VITAMIN C) 500 MG tablet Take 500 mg by mouth daily.    ? cholecalciferol (VITAMIN D3) 25 MCG (1000 UNIT) tablet Take 1,000 Units by mouth daily.    ? cyanocobalamin 100 MCG tablet Take 100 mcg by mouth as needed.    ? ezetimibe (ZETIA) 10 MG tablet Take 1 tablet (10 mg total) by mouth daily. 90 tablet 1  ? furosemide (LASIX) 20 MG tablet Take 1 tablet (20 mg total) by mouth daily. 90 tablet 1  ? losartan (COZAAR) 50 MG tablet Take 1 tablet by mouth once daily 90 tablet 0  ? pantoprazole (PROTONIX) 40 MG tablet Take 1 tablet (40 mg total) by mouth daily. Instead of dexilant  30 tablet 3  ? rosuvastatin (CRESTOR) 5 MG tablet Take 1 by mouth 2 x per week  can increase to 3 x per week as tolerated 45 tablet 3  ? ULORIC 40 MG tablet Take 40 mg by mouth daily.     ? ?Facility-Administered Medications Prior to Visit  ?Medication Dose Route Frequency Provider Last Rate Last Admin  ? technetium tetrofosmin (TC-MYOVIEW) injection 19.5 millicurie  09.3 millicurie Intravenous Once PRN Fay Records, MD      ? ? ? ?EXAM: ? ?BP 130/80 (BP Location: Left Arm, Patient Position: Sitting, Cuff Size: Normal)   Pulse 64   Temp 98.5 ?F (36.9 ?C) (Oral)   Ht '5\' 6"'$  (1.676 m)   Wt 253 lb 9.6 oz (115 kg)   SpO2 97%   BMI 40.93 kg/m?  ? ?Body mass index is 40.93 kg/m?. ? ?GENERAL: vitals reviewed and listed above, alert, oriented, appears well hydrated and in no acute distress ?HEENT: atraumatic, conjunctiva  clear, no obvious abnormalities on inspection of external nose and ears  ?NECK: no obvious masses on inspection palpation  ?LUNGS: clear to auscultation bilaterally, no wheezes, rales or rhonchi, good air movement ?CV: HRRR, no clubbing cyanosis o legs  1+ edema left more than right no redness or local tender  ?MS: moves all extremities  ?PSYCH: pleasant and cooperative, no obvious depression or anxiety ?Lab Results  ?Component Value Date  ? WBC 4.3 05/04/2021  ? HGB 13.1 05/04/2021  ? HCT 40 05/04/2021  ? PLT 181 05/04/2021  ? GLUCOSE 104 (H) 07/02/2021  ? CHOL 181 04/10/2021  ? TRIG 133.0 04/10/2021  ? HDL 76.00 04/10/2021  ? LDLDIRECT 171.0 12/20/2015  ? [place] 78 04/10/2021  ? ALT 18 04/10/2021  ? AST 21 04/10/2021  ? NA 139 07/02/2021  ? K 3.6 07/02/2021  ? CL 103 07/02/2021  ? CREATININE 1.29 (H) 07/02/2021  ? BUN 12 07/02/2021  ? CO2 27 07/02/2021  ? TSH 1.61 07/04/2017  ? HGBA1C 6.3 01/04/2021  ? ?BP Readings from Last 3 Encounters:  ?07/02/21 130/80  ?06/11/21 122/82  ?04/10/21 130/90  ? ? ?ASSESSMENT AND PLAN: ? ?Discussed the following assessment and plan: ? ?Edema of both lower legs -  Plan: Basic metabolic panel, Magnesium, VAS [place] LOWER EXTREMITY VENOUS (DVT), Magnesium, Basic metabolic panel ? ?Left leg pain - assymmetric swelling   - Plan: VAS  [place] LOWER EXTREMITY VENOUS (DVT) ? ?Medication management - Plan: Basic metabolic panel, Magnesium, Magnesium, Basic metabolic panel ? ?Essential hypertension ?Doing better  on lasix  after stopped hctz still with some edema  ?Potassium check today is on arb also .    ?Uncertain cause of  assymmetrical  edema  sx and leg pain this weekend   r/o dvt obstruction .   ? ?-Patient advised to return or notify health care team  if  new concerns arise. ? ?Patient Instructions  ?Lab today to check potassium levels since you are taking the  fluid pill every day .  ?You will be contacted about getting ultrasound of left leg   to  ,make sure no  obstruction such as a blood clot .causing the sx you  had this weekend.  ? ?Plan follow up depending   ? ?Or 3 months  ? ?Standley Brooking. [place] Kievit M.D. ?

## 2021-07-04 ENCOUNTER — Ambulatory Visit (HOSPITAL_COMMUNITY)
Admission: RE | Admit: 2021-07-04 | Discharge: 2021-07-04 | Disposition: A | Discharge status: Home / Self Care | Payer: Medicare HMO | Source: Ambulatory Visit | Attending: Cardiovascular Disease | Admitting: Cardiovascular Disease

## 2021-07-04 DIAGNOSIS — M79605 Pain in left leg: Secondary | ICD-10-CM | POA: Diagnosis not present

## 2021-07-04 DIAGNOSIS — R600 Localized edema: Secondary | ICD-10-CM | POA: Insufficient documentation

## 2021-07-05 ENCOUNTER — Other Ambulatory Visit: Payer: Self-pay

## 2021-07-05 DIAGNOSIS — R609 Edema, unspecified: Secondary | ICD-10-CM

## 2021-07-05 DIAGNOSIS — Z79899 Other long term (current) drug therapy: Secondary | ICD-10-CM

## 2021-07-05 NOTE — Progress Notes (Signed)
Good news  no evidence of blood clot in leg .  reassuring

## 2021-07-05 NOTE — Progress Notes (Signed)
Potassium is low normal  but normal . ?Arrange lab  BMP to be done before next visit she has  in August  dx :  swelling and med managment

## 2021-07-08 NOTE — Progress Notes (Signed)
Final results show normal  doppler   no blood clot

## 2021-07-23 ENCOUNTER — Other Ambulatory Visit: Payer: Self-pay | Admitting: Internal Medicine

## 2021-07-27 ENCOUNTER — Other Ambulatory Visit: Payer: Self-pay | Admitting: Internal Medicine

## 2021-08-08 ENCOUNTER — Other Ambulatory Visit: Payer: Self-pay | Admitting: Internal Medicine

## 2021-08-08 DIAGNOSIS — Z6837 Body mass index (BMI) 37.0-37.9, adult: Secondary | ICD-10-CM | POA: Diagnosis not present

## 2021-08-08 DIAGNOSIS — K219 Gastro-esophageal reflux disease without esophagitis: Secondary | ICD-10-CM | POA: Diagnosis not present

## 2021-08-08 DIAGNOSIS — R32 Unspecified urinary incontinence: Secondary | ICD-10-CM | POA: Diagnosis not present

## 2021-08-08 DIAGNOSIS — E785 Hyperlipidemia, unspecified: Secondary | ICD-10-CM | POA: Diagnosis not present

## 2021-08-08 DIAGNOSIS — Z008 Encounter for other general examination: Secondary | ICD-10-CM | POA: Diagnosis not present

## 2021-08-08 DIAGNOSIS — M1990 Unspecified osteoarthritis, unspecified site: Secondary | ICD-10-CM | POA: Diagnosis not present

## 2021-08-08 DIAGNOSIS — I10 Essential (primary) hypertension: Secondary | ICD-10-CM | POA: Diagnosis not present

## 2021-08-08 DIAGNOSIS — Z8249 Family history of ischemic heart disease and other diseases of the circulatory system: Secondary | ICD-10-CM | POA: Diagnosis not present

## 2021-08-08 DIAGNOSIS — M109 Gout, unspecified: Secondary | ICD-10-CM | POA: Diagnosis not present

## 2021-09-03 DIAGNOSIS — R768 Other specified abnormal immunological findings in serum: Secondary | ICD-10-CM | POA: Diagnosis not present

## 2021-09-03 DIAGNOSIS — I10 Essential (primary) hypertension: Secondary | ICD-10-CM | POA: Diagnosis not present

## 2021-09-03 DIAGNOSIS — Z79899 Other long term (current) drug therapy: Secondary | ICD-10-CM | POA: Diagnosis not present

## 2021-09-03 DIAGNOSIS — M1990 Unspecified osteoarthritis, unspecified site: Secondary | ICD-10-CM | POA: Diagnosis not present

## 2021-09-03 DIAGNOSIS — M109 Gout, unspecified: Secondary | ICD-10-CM | POA: Diagnosis not present

## 2021-09-09 ENCOUNTER — Other Ambulatory Visit: Payer: Self-pay | Admitting: Internal Medicine

## 2021-10-02 ENCOUNTER — Encounter: Payer: Self-pay | Admitting: Internal Medicine

## 2021-10-02 ENCOUNTER — Ambulatory Visit (INDEPENDENT_AMBULATORY_CARE_PROVIDER_SITE_OTHER): Payer: Medicare HMO | Admitting: Internal Medicine

## 2021-10-02 VITALS — BP 152/76 | HR 81 | Temp 98.4°F | Wt 249.2 lb

## 2021-10-02 DIAGNOSIS — Z79899 Other long term (current) drug therapy: Secondary | ICD-10-CM

## 2021-10-02 DIAGNOSIS — M2550 Pain in unspecified joint: Secondary | ICD-10-CM

## 2021-10-02 DIAGNOSIS — I10 Essential (primary) hypertension: Secondary | ICD-10-CM | POA: Diagnosis not present

## 2021-10-02 LAB — BASIC METABOLIC PANEL
BUN: 16 mg/dL (ref 6–23)
CO2: 28 mEq/L (ref 19–32)
Calcium: 10 mg/dL (ref 8.4–10.5)
Chloride: 103 mEq/L (ref 96–112)
Creatinine, Ser: 1.4 mg/dL — ABNORMAL HIGH (ref 0.40–1.20)
GFR: 36.27 mL/min — ABNORMAL LOW (ref >60.00)
Glucose, Bld: 113 mg/dL — ABNORMAL HIGH (ref 70–99)
Potassium: 4.3 mEq/L (ref 3.5–5.1)
Sodium: 140 mEq/L (ref 135–145)

## 2021-10-02 MED ORDER — LOSARTAN POTASSIUM 50 MG PO TABS: 75.0000 mg | ORAL_TABLET | Freq: Every day | ORAL | 1 refills | Status: DC

## 2021-10-02 NOTE — Progress Notes (Signed)
Chief Complaint  Patient presents with   Follow-up    On swelling on both lower legs. Pt reports the swelling are doing better    HPI: Cynthia Estrada 76 y.o. come in for Chronic disease management  seen 3 mos ago fro increase swelling given Lasix and follow-up she is taking losartan 50 mg a day She is also seen a rheumatologist in the meantime is given her prednisone course which has significantly helped her feeling swelling and overall wellbeing.  No fever UTI falling.  Has not checked her blood pressure at home but thought it had been okay ROS: See pertinent positives and negatives per HPI.  Past Medical History:  Diagnosis Date   ANXIETY 11/11/2006   Arthritis    BACK PAIN, LUMBAR 07/11/2008   Cataract    right eye removed    Chest pain 06/12/2010   Ok stress echo has Gi sx also cw gerd.    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   Retinal detachment    SYNCOPE, HX OF 02/10/2007   UNSPECIFIED ARTHROPATHY SITE UNSPECIFIED 01/12/2007   Unspecified vitamin D deficiency 02/10/2007    Family History  Problem Relation Age of Onset   Hypertension Mother    Diabetes Mother    Hypertension Sister    Hypertension Brother    Hyperlipidemia Brother    Colon cancer Neg Hx    Pancreatic cancer Neg Hx    Rectal cancer Neg Hx    Stomach cancer Neg Hx    Colon polyps Neg Hx    Breast cancer Neg Hx     Social History   Socioeconomic History   Marital status: Single    Spouse name: Not on file   Number of children: Not on file   Years of education: Not on file   Highest education level: Not on file  Occupational History   Occupation: Retire    Comment:  [employer]  Tobacco Use   Smoking status: Never   Smokeless tobacco: Never  Vaping Use   Vaping Use: Never used  Substance and Sexual Activity   Alcohol use: No   Drug use: No   Sexual activity: Not on file  Other Topics Concern   Not on file  Social History Narrative   HH of 1    No pets   Retired from [employer] walking  onlylimited by knee oa changes    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    Social Determinants of Health   Financial Resource Strain: Low Risk  (04/05/2021)   Overall Financial Resource Strain (CARDIA)    Difficulty of Paying Living Expenses: Not hard at all  Food Insecurity: Unknown (04/05/2021)   Hunger Vital Sign    Worried About Running Out of Food in the Last Year: Never true    Ran Out of Food in the Last Year: Not on file  Transportation Needs: No Transportation Needs (04/05/2021)   PRAPARE - Hydrologist (Medical): No    Lack of Transportation (Non-Medical): No  Physical Activity: Insufficiently  Active (04/05/2021)   Exercise Vital Sign    Days of Exercise per Week: 2 days    Minutes of Exercise per Session: 10 min  Stress: No Stress Concern Present (04/05/2021)   [place]    Feeling of Stress : Not at all  Social Connections: Moderately Integrated (04/05/2021)   Social Connection and Isolation Panel [NHANES]    Frequency of Communication with Friends and Family: More than three times a week    Frequency of Social Gatherings with Friends and Family: More than three times a week    Attends Religious Services: More than 4 times per year    Active Member of [employer] or Organizations: Yes    Attends Music therapist: More than 4 times per year    Marital Status: Never married    Outpatient Medications Prior to Visit  Medication Sig Dispense Refill   Ascorbic Acid (VITAMIN C) 500 MG  tablet Take 500 mg by mouth daily.     cholecalciferol (VITAMIN D3) 25 MCG (1000 UNIT) tablet Take 1,000 Units by mouth daily.     cyanocobalamin 100 MCG tablet Take 100 mcg by mouth as needed.     ezetimibe (ZETIA) 10 MG tablet Take 1 tablet (10 mg total) by mouth daily. 90 tablet 1   furosemide (LASIX) 20 MG tablet Take 1 tablet (20 mg total) by mouth daily. 90 tablet 1   pantoprazole (PROTONIX) 40 MG tablet TAKE 1 TABLET BY MOUTH ONCE DAILY. INSTEAD OF DEXILANT 30 tablet 0   predniSONE (DELTASONE) 20 MG tablet 1 tablet     rosuvastatin (CRESTOR) 5 MG tablet Take 1 by mouth 2 x per week  can increase to 3 x per week as tolerated 45 tablet 3   ULORIC 40 MG tablet Take 40 mg by mouth daily.      losartan (COZAAR) 50 MG tablet Take 1 tablet by mouth once daily 90 tablet 0   Facility-Administered Medications Prior to Visit  Medication Dose Route Frequency Provider Last Rate Last Admin   technetium tetrofosmin (TC-MYOVIEW) injection 33.2 millicurie  95.1 millicurie Intravenous Once PRN Fay Records, MD         EXAM:  BP (!) 152/76 (BP Location: Left Arm, Cuff Size: Large)   Pulse 81   Temp 98.4 F (36.9 C) (Oral)   Wt 249 lb 3.2 oz (113 kg)   SpO2 95%   BMI 40.22 kg/m   Body mass index is 40.22 kg/m.  GENERAL: vitals reviewed and listed above, alert, oriented, appears well hydrated and in no acute distress has a cane for steadiness if needed HEENT: atraumatic, conjunctiva  clear, no obvious abnormalities on inspection of external nose and earsNECK: no obvious masses on inspection palpation  LUNGS: clear to auscultation bilaterally, no wheezes, rales or rhonchi, good air movement CV: HRRR, no clubbing cyanosis or edema is down to +1 left greater than right peripheral edema nl cap refill  MS: moves all extremities without noticeable focal  abnormality appears more comfortable today. PSYCH: pleasant and cooperative, no obvious depression or anxiety Lab Results  Component Value Date    WBC 4.3 05/04/2021   HGB 13.1 05/04/2021   HCT 40 05/04/2021   PLT 181 05/04/2021   GLUCOSE 113 (H) 10/02/2021   CHOL 181 04/10/2021   TRIG 133.0 04/10/2021   HDL 76.00 04/10/2021   LDLDIRECT 171.0 12/20/2015   LDLCALC 78 04/10/2021   ALT 18 04/10/2021   AST 21  04/10/2021   NA 140 10/02/2021   K 4.3 10/02/2021   CL 103 10/02/2021   CREATININE 1.40 (H) 10/02/2021   BUN 16 10/02/2021   CO2 28 10/02/2021   TSH 1.61 07/04/2017   HGBA1C 6.3 01/04/2021   BP Readings from Last 3 Encounters:  10/02/21 (!) 152/76  07/02/21 130/80  06/11/21 122/82    ASSESSMENT AND PLAN:  Discussed the following assessment and plan:  Essential hypertension - Plan: Basic metabolic panel, Basic metabolic panel  Medication management - Plan: Basic metabolic panel, Basic metabolic panel  Multiple joint pain - On prednisone steroid course with improvement Feels much better on this prednisone course blood pressure however is up in the office today she can get her sisters blood pressure monitor to check her reading goal discussed Increase losartan to 75 mg a day plan 3 months PV due in November and check blood pressure readings to make sure they are in control. PV due November  -Patient advised to return or notify health care team  if  new concerns arise.  Patient Instructions  Glad you are doing better .  Checking  potassium and kidney function fu today  Increase the losartan to  75 mg per day( 1.5 pills ) to get best  BP control.  We can send more into your pharmacy.  Plan fu cpx in November        Rasean Joos K. Elisabeth Strom M.D.

## 2021-10-02 NOTE — Patient Instructions (Addendum)
Glad you are doing better .  Checking  potassium and kidney function fu today  Increase the losartan to  75 mg per day( 1.5 pills ) to get best  BP control.  We can send more into your pharmacy.  Plan fu cpx in November

## 2021-10-05 NOTE — Progress Notes (Signed)
Kidney function  still decreased needs follow as planned    potassium is normal   we can  follow up at fall visit .

## 2021-10-09 ENCOUNTER — Telehealth: Payer: Self-pay | Admitting: Internal Medicine

## 2021-10-09 NOTE — Telephone Encounter (Signed)
Pt is calling and would like blood work results 

## 2021-10-09 NOTE — Telephone Encounter (Signed)
Spoke to patient of lab result. See lab note

## 2021-10-10 ENCOUNTER — Other Ambulatory Visit: Payer: Self-pay | Admitting: Internal Medicine

## 2021-10-11 ENCOUNTER — Telehealth: Payer: Self-pay | Admitting: Internal Medicine

## 2021-10-11 NOTE — Telephone Encounter (Signed)
Left a voicemail to call us back.

## 2021-10-11 NOTE — Telephone Encounter (Signed)
Cynthia Estrada with Cynthia Estrada has spoken with the pt and pt told her she takes losartan 2.5 tab daily instead of 1.5 tab daily . Please call pt to verify the correct dose

## 2021-10-12 NOTE — Telephone Encounter (Signed)
Attempt to reach [facility], left a voicemail to call back.

## 2021-10-15 NOTE — Telephone Encounter (Signed)
Third attempt to reach [place]. Left a voicemail to call back.

## 2021-10-15 NOTE — Telephone Encounter (Signed)
Spoke to [place] and inform her that Dr. Regis Bill would like patient to take '75mg'$  of Losartan. She direct for [place] to follow up with patient.  Contacted patient and inform her of instruction on taking 1.5 tab daily to for it to be 75 mg per Dr. Regis Bill. Patient verbalized understanding. No further question.

## 2021-11-09 ENCOUNTER — Other Ambulatory Visit: Payer: Self-pay | Admitting: Internal Medicine

## 2021-11-09 ENCOUNTER — Telehealth: Payer: Self-pay | Admitting: Licensed Clinical Social Worker

## 2021-11-09 NOTE — Patient Outreach (Signed)
  Care Coordination   Initial Visit Note   11/09/2021 Name: ELIZEBATH WEVER MRN: [id number] DOB: [birth year]  JEANA KERSTING is a 76 y.o. year old female who sees Panosh, Standley Brooking, MD for primary care. I spoke with  Lady Gary by phone today.  What matters to the patients health and wellness today?  LCSW attempted to inform pt of Care Coordination Services. Pt requested a call back    SDOH assessments and interventions completed:  No     Care Coordination Interventions Activated:  No  Care Coordination Interventions:  No, not indicated   Follow up plan: Follow up call scheduled for 11/12/21    Encounter Outcome:  Pt. Request to Call [place], MSW, [email or web address] Phone [phone] 2:50 PM

## 2021-11-19 ENCOUNTER — Ambulatory Visit: Payer: Self-pay

## 2021-11-19 NOTE — Patient Instructions (Signed)
Visit Information  Thank you for taking time to visit with me today. Please don't hesitate to contact me if I can be of assistance to you.   Following are the goals we discussed today:   Goals Addressed             This Visit's Progress    COMPLETED: Care Coordination Activities       Care Coordination Interventions: SDoH screening performed - identified patient does experience some difficulty with finances. She is actively working on applying for low income apartments Determined the patient does not have concerns with medications costs and/or adherence Performed chart review to note that patient has participated in an annual wellness visit Education provided on the role of the care coordination team - no follow up desired at this time Instructed the patient to contact her primary care provider as needed        Please call the care guide team at [phone] if you need to schedule an appointment with our care coordination team.  If you are experiencing a Mental Health or [place] or need someone to talk to, please call 1-800-273-TALK (toll free, 24 hour hotline)  The patient verbalized understanding of instructions, educational materials, and care plan provided today and DECLINED offer to receive copy of patient instructions, educational materials, and care plan.   No further follow up required: Please contact your primary care provider as needed  Daneen Schick, Arita Miss, CDP Social Worker, Certified Dementia Practitioner [place] [facility] [phone]

## 2021-11-19 NOTE — Patient Outreach (Signed)
  Care Coordination   11/19/2021 Name: Cynthia Estrada MRN: [id number] DOB: [birth year]   Care Coordination Outreach Attempts:  A second unsuccessful outreach was attempted today to offer the patient with information about available care coordination services as a benefit of their health plan.     Follow Up Plan:  Additional outreach attempts will be made to offer the patient care coordination information and services.   Encounter Outcome:  No Answer  Care Coordination Interventions Activated:  No   Care Coordination Interventions:  No, not indicated    Daneen Schick, BSW, CDP Social Worker, Certified Dementia Practitioner [facility] Care Management  Care Coordination [phone]

## 2021-11-19 NOTE — Patient Outreach (Signed)
  Care Coordination   Initial Visit Note   11/19/2021 Name: Cynthia Estrada MRN: [id number] DOB: [birth year]  Cynthia Estrada is a 76 y.o. year old female who sees Panosh, Standley Brooking, MD for primary care. I spoke with  Lady Gary by phone today.  What matters to the patients health and wellness today?  No concerns, doing well at this time    Goals Addressed             This Visit's Progress    COMPLETED: Care Coordination Activities       Care Coordination Interventions: SDoH screening performed - identified patient does experience some difficulty with finances. She is actively working on applying for low income apartments Determined the patient does not have concerns with medications costs and/or adherence Performed chart review to note that patient has participated in an annual wellness visit Education provided on the role of the care coordination team - no follow up desired at this time Instructed the patient to contact her primary care provider as needed        SDOH assessments and interventions completed:  Yes  SDOH Interventions Today    Flowsheet Row Most Recent Value  SDOH Interventions   Food Insecurity Interventions Intervention Not Indicated  Housing Interventions Intervention Not Indicated  Transportation Interventions Intervention Not Indicated  Utilities Interventions Intervention Not Indicated  Financial Strain Interventions Intervention Not Indicated  [pt is working on getting into low income housing]        Care Coordination Interventions Activated:  Yes  Care Coordination Interventions:  Yes, provided   Follow up plan: No further intervention required.   Encounter Outcome:  Pt. Visit Completed   Daneen Schick, BSW, CDP Social Worker, Certified Dementia Practitioner [place] [facility]  Care Coordination [phone]

## 2021-12-09 ENCOUNTER — Other Ambulatory Visit: Payer: Self-pay | Admitting: Internal Medicine

## 2021-12-10 ENCOUNTER — Other Ambulatory Visit: Payer: Self-pay | Admitting: Internal Medicine

## 2021-12-10 DIAGNOSIS — Z1231 Encounter for screening mammogram for malignant neoplasm of breast: Secondary | ICD-10-CM

## 2021-12-11 DIAGNOSIS — M1990 Unspecified osteoarthritis, unspecified site: Secondary | ICD-10-CM | POA: Diagnosis not present

## 2021-12-11 DIAGNOSIS — R768 Other specified abnormal immunological findings in serum: Secondary | ICD-10-CM | POA: Diagnosis not present

## 2021-12-11 DIAGNOSIS — Z23 Encounter for immunization: Secondary | ICD-10-CM | POA: Diagnosis not present

## 2021-12-11 DIAGNOSIS — Z79899 Other long term (current) drug therapy: Secondary | ICD-10-CM | POA: Diagnosis not present

## 2021-12-11 DIAGNOSIS — M109 Gout, unspecified: Secondary | ICD-10-CM | POA: Diagnosis not present

## 2021-12-11 DIAGNOSIS — I10 Essential (primary) hypertension: Secondary | ICD-10-CM | POA: Diagnosis not present

## 2021-12-12 ENCOUNTER — Other Ambulatory Visit: Payer: Self-pay | Admitting: Internal Medicine

## 2021-12-14 ENCOUNTER — Encounter: Payer: Self-pay | Admitting: Internal Medicine

## 2022-01-01 ENCOUNTER — Encounter: Payer: Self-pay | Admitting: Physician Assistant

## 2022-01-13 ENCOUNTER — Other Ambulatory Visit: Payer: Self-pay | Admitting: Internal Medicine

## 2022-01-15 NOTE — Progress Notes (Signed)
Chief Complaint  Patient presents with   Annual Exam    HPI: Patient  Cynthia Estrada  76 y.o. comes in today for Preventive Health Care visit    HT:  on Losartan   130  over most times  HLD:  [place] nd zetia  Uloric  for gout suppression Sees rheum  has pos rf but not meet criteria  last uric acid 4.7  nl esr  nl cbc cr 1.35  Feet swelling   tired some sob no specific cp   Health Maintenance  Topic Date Due   Zoster Vaccines- Shingrix (1 of 2) Never done   COVID-19 Vaccine (3 - Pfizer risk series) 04/27/2019   COLONOSCOPY (Pts 45-9yr Insurance coverage will need to be confirmed)  10/19/2021   MAMMOGRAM  01/11/2022   Medicare Annual Wellness (AWV)  04/05/2022   Pneumonia Vaccine 76 Years old  Completed   INFLUENZA VACCINE  Completed   DEXA SCAN  Completed   Hepatitis C Screening  Completed   HPV VACCINES  Aged Out   Health Maintenance Review LIFESTYLE:  Exercise:   [facility] and walking.   Tired all over.  Tobacco/ETS: n Alcohol:  no Sugar beverages:ginger ale  Sleep: between 5-7 wakening  nocturia  Drug use: no HH of  1 no pets      ROS:    REST of 12 system review negative except as per HPI no sncope no cough    Past Medical History:  Diagnosis Date   ANXIETY 11/11/2006   Arthritis    BACK PAIN, LUMBAR 07/11/2008   Cataract    right eye removed    Chest pain 06/12/2010   Ok stress echo has Gi sx also cw gerd.    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   Retinal detachment    SYNCOPE, HX OF 02/10/2007   UNSPECIFIED ARTHROPATHY SITE UNSPECIFIED 01/12/2007   Unspecified vitamin D deficiency 02/10/2007    Past Surgical  History:  Procedure Laterality Date   ABDOMINAL HYSTERECTOMY     age [age]for fibroids   CATARACT EXTRACTION Right    done with retinal detachment repair    COLON SURGERY  08/2009   COLONOSCOPY  07/01/2009   Two tubulovillous adenoms 7 and 4 cm size   COLONOSCOPY W/ POLYPECTOMY  07/11/2010   two 7-8 mm polyps removed - adenomas   hemicollectomy  08/2009   Right for tubulovillous adenomas (2)   RETINAL DETACHMENT SURGERY      Family History  Problem Relation Age of Onset   Hypertension Mother    Diabetes Mother    Hypertension Sister    Hypertension Brother    Hyperlipidemia Brother    Colon cancer Neg Hx    Pancreatic cancer Neg Hx    Rectal cancer Neg Hx    Stomach cancer Neg Hx    Colon polyps Neg Hx    Breast cancer Neg Hx     Social History   Socioeconomic History   Marital status: Single    Spouse name: Not on file   Number of children: Not on file   Years of education: Not on  file   Highest education level: Not on file  Occupational History   Occupation: Retire    Comment: [employer]  Tobacco Use   Smoking status: Never   Smokeless tobacco: Never  Vaping Use   Vaping Use: Never used  Substance and Sexual Activity   Alcohol use: No   Drug use: No   Sexual activity: Not on file  Other Topics Concern   Not on file  Social History Narrative   HH of 1    No pets   Retired from [employer] walking  onlylimited by knee oa changes    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    Social Determinants of Health   Financial Resource Strain: Medium Risk (11/19/2021)   Overall Financial Resource Strain (CARDIA)    Difficulty of Paying Living Expenses: Somewhat hard  Food Insecurity: No Food Insecurity (11/19/2021)   Hunger Vital Sign    Worried About Running Out of Food in the Last Year: Never true    Ran Out of Food in the Last Year: Never true  Transportation Needs: No Transportation Needs  (11/19/2021)   PRAPARE - Hydrologist (Medical): No    Lack of Transportation (Non-Medical): No  Physical Activity: Insufficiently Active (04/05/2021)   Exercise Vital Sign    Days of Exercise per Week: 2 days    Minutes of Exercise per Session: 10 min  Stress: No Stress Concern Present (04/05/2021)   [place]    Feeling of Stress : Not at all  Social Connections: Moderately Integrated (04/05/2021)   Social Connection and Isolation Panel [NHANES]    Frequency of Communication with Friends and Family: More than three times a week    Frequency of Social Gatherings with Friends and Family: More than three times a week    Attends Religious Services: More than 4 times per year    Active Member of [employer] or Organizations: Yes    Attends Music therapist: More than 4 times per year    Marital Status: Never married    Outpatient Medications Prior to Visit  Medication Sig Dispense Refill   Ascorbic Acid (VITAMIN C) 500 MG tablet Take 500 mg by mouth daily.     cholecalciferol (VITAMIN D3) 25 MCG (1000 UNIT) tablet Take 1,000 Units by mouth daily.     cyanocobalamin 100 MCG tablet Take 100 mcg by mouth as needed.     ezetimibe (ZETIA) 10 MG tablet Take 1 tablet by mouth once daily 90 tablet 0   furosemide (LASIX) 20 MG tablet Take 1 tablet (20 mg total) by mouth daily. 90 tablet 1   losartan (COZAAR) 50 MG tablet Take 1.5 tablets (75 mg total) by mouth daily. 135 tablet 1   pantoprazole (PROTONIX) 40 MG tablet TAKE 1 TABLET BY MOUTH ONCE DAILY *REPLACES  DEXILANT* 30 tablet 2   rosuvastatin (CRESTOR) 5 MG tablet Take 1 by mouth 2 x per week  can increase to 3 x per week as tolerated 45 tablet 3   ULORIC 40 MG tablet Take 40 mg by mouth daily.      predniSONE (DELTASONE) 20 MG tablet 1 tablet     Facility-Administered Medications Prior to Visit  Medication Dose Route Frequency Provider Last  Rate Last Admin   technetium tetrofosmin (TC-MYOVIEW) injection 02.7 millicurie  25.3 millicurie Intravenous Once PRN Harrington Challenger,  Carmin Muskrat, MD         EXAM:  BP (!) 146/86 (BP Location: Right Arm, Cuff Size: Large)   Pulse 71   Temp 98.3 F (36.8 C) (Oral)   Ht 5' 4.5" (1.638 m)   Wt 261 lb 9.6 oz (118.7 kg)   SpO2 97%   BMI 44.21 kg/m   Body mass index is 44.21 kg/m. Wt Readings from Last 3 Encounters:  01/16/22 261 lb 9.6 oz (118.7 kg)  10/02/21 249 lb 3.2 oz (113 kg)  07/02/21 253 lb 9.6 oz (115 kg)    Physical Exam: Vital signs reviewed YQM:VHQI is a well-developed well-nourished alert cooperative    who appearsr stated age in no acute distress.  HEENT: normocephalic atraumatic , Eyes: PERRL EOM's full, conjunctiva clear, Nares: paten,t no deformity discharge or tenderness., Ears: no deformity EAC's clear TMs with normal landmarks. Mouth: clear OP, no lesions, edema.  Moist mucous membranes. Dentition in adequate repair. NECK: supple without masses, thyromegaly or bruits. CHEST/PULM:  Clear to auscultation and percussion breath sounds equal no wheeze , rales or rhonchi. No chest wall deformities or tenderness. Breast: normal by inspection . No dimpling, discharge, masses, tenderness or discharge . CV: PMI is nondisplaced, S1 S2 no gallops, murmurs, rubs. Peripheral pulses are full without delay.  ABDOMEN: Bowel sounds normal nontender  No guard or rebound, no hepato splenomegal no CVA tenderness.  No hernia. Extremtities:  No clubbing cyanosis 2+ le , no acute joint swelling or redness no focal atrophy but slow gait from ms pain  NEURO:  Oriented x3, cranial nerves 3-12 appear to be intact, no obvious focal weakness,gait within normal limits no abnormal reflexes or asymmetrical SKIN: No acute rashes normal turgor, color, no bruising or petechiae. PSYCH: Oriented, good eye contact, no obvious depression anxiety, cognition and judgment appear normal. LN: no cervical axillary  adenopathy  Lab Results  Component Value Date   WBC 4.3 05/04/2021   HGB 13.1 05/04/2021   HCT 40 05/04/2021   PLT 181 05/04/2021   GLUCOSE 113 (H) 10/02/2021   CHOL 181 04/10/2021   TRIG 133.0 04/10/2021   HDL 76.00 04/10/2021   LDLDIRECT 171.0 12/20/2015   LDLCALC 78 04/10/2021   ALT 18 04/10/2021   AST 21 04/10/2021   NA 140 10/02/2021   K 4.3 10/02/2021   CL 103 10/02/2021   CREATININE 1.40 (H) 10/02/2021   BUN 16 10/02/2021   CO2 28 10/02/2021   TSH 1.61 07/04/2017   HGBA1C 5.8 (A) 01/16/2022    BP Readings from Last 3 Encounters:  01/16/22 (!) 146/86  10/02/21 (!) 152/76  07/02/21 130/80    Lab results reviewed with patient  from rheumatology  ASSESSMENT AND PLAN:  Discussed the following assessment and plan:    ICD-10-CM   1. Visit for preventive health examination  Z00.00     2. Essential hypertension  I10     3. Hyperglycemia  R73.9 POC HgB A1c    4. Tired  R53.83     5. Shortness of breath  R06.02     6. Myalgia  M79.10     7. Medication management  Z79.899     8. Multiple joint pain  M25.50     9. History of gout  Z87.39     10. Osteoarthritis of multiple joints, unspecified osteoarthritis type  M15.9      Record review  care every    updated labs in October 23 Has weight gain and edema  which  seems chronic and could be related to her chronic tired . Trial of 5 day double diuretic and make fu appt with   team   Return for 6-12 months or as needed  but see cardiology  for current symptoms .  Patient Care Team: Jaymar Loeber, Standley Brooking, MD as PCP - General Jettie Booze, MD as PCP - Cardiology (Cardiology) Gatha Mayer, MD (Gastroenterology) Addison Lank, MD as Referring Physician (Dermatology) Josue Hector, MD (Cardiology) Valinda Party, MD (Rheumatology) Jettie Booze, MD as Consulting Physician (Cardiology) Patient Instructions  Good to see you today , You  had full l set of labs in October with Rheumatology and labs  were stable. Or ok   Try taking 5 days of  40 mg ( 37m x2) of the furosemide fluid pill to see if swelling is less and how you feel . Advise  see cardiology as to the swelling and fatigue symptoms .  Dr vIrish Lack .    I eill forward not to  his office in interim .   WStandley Brooking Malachai Schalk M.D.

## 2022-01-16 ENCOUNTER — Ambulatory Visit (INDEPENDENT_AMBULATORY_CARE_PROVIDER_SITE_OTHER): Payer: Medicare HMO | Admitting: Internal Medicine

## 2022-01-16 ENCOUNTER — Encounter: Payer: Self-pay | Admitting: Internal Medicine

## 2022-01-16 VITALS — BP 146/86 | HR 71 | Temp 98.3°F | Ht 64.5 in | Wt 261.6 lb

## 2022-01-16 DIAGNOSIS — M2550 Pain in unspecified joint: Secondary | ICD-10-CM

## 2022-01-16 DIAGNOSIS — R0602 Shortness of breath: Secondary | ICD-10-CM

## 2022-01-16 DIAGNOSIS — Z8739 Personal history of other diseases of the musculoskeletal system and connective tissue: Secondary | ICD-10-CM | POA: Diagnosis not present

## 2022-01-16 DIAGNOSIS — M159 Polyosteoarthritis, unspecified: Secondary | ICD-10-CM | POA: Diagnosis not present

## 2022-01-16 DIAGNOSIS — R739 Hyperglycemia, unspecified: Secondary | ICD-10-CM | POA: Diagnosis not present

## 2022-01-16 DIAGNOSIS — R5383 Other fatigue: Secondary | ICD-10-CM | POA: Diagnosis not present

## 2022-01-16 DIAGNOSIS — M791 Myalgia, unspecified site: Secondary | ICD-10-CM

## 2022-01-16 DIAGNOSIS — I10 Essential (primary) hypertension: Secondary | ICD-10-CM

## 2022-01-16 DIAGNOSIS — Z0000 Encounter for general adult medical examination without abnormal findings: Secondary | ICD-10-CM

## 2022-01-16 DIAGNOSIS — Z79899 Other long term (current) drug therapy: Secondary | ICD-10-CM

## 2022-01-16 LAB — POCT GLYCOSYLATED HEMOGLOBIN (HGB A1C): Hemoglobin A1C: 5.8 % — AB (ref 4.0–5.6)

## 2022-01-16 NOTE — Patient Instructions (Addendum)
Good to see you today , You  had full l set of labs in October with Rheumatology and labs were stable. Or ok   Try taking 5 days of  40 mg ( '20mg'$  x2) of the furosemide fluid pill to see if swelling is less and how you feel . Advise  see cardiology as to the swelling and fatigue symptoms .  Dr Irish Lack  .    I eill forward not to  his office in interim .

## 2022-01-22 ENCOUNTER — Ambulatory Visit: Payer: Medicare HMO | Attending: Interventional Cardiology | Admitting: Interventional Cardiology

## 2022-01-22 ENCOUNTER — Encounter: Payer: Self-pay | Admitting: Interventional Cardiology

## 2022-01-22 ENCOUNTER — Ambulatory Visit
Admission: RE | Admit: 2022-01-22 | Discharge: 2022-01-22 | Disposition: A | Discharge status: Home / Self Care | Payer: Medicare HMO | Source: Ambulatory Visit | Attending: Internal Medicine | Admitting: Internal Medicine

## 2022-01-22 VITALS — BP 132/70 | HR 80 | Ht 64.0 in | Wt 256.8 lb

## 2022-01-22 DIAGNOSIS — R0609 Other forms of dyspnea: Secondary | ICD-10-CM

## 2022-01-22 DIAGNOSIS — R600 Localized edema: Secondary | ICD-10-CM | POA: Diagnosis not present

## 2022-01-22 DIAGNOSIS — I10 Essential (primary) hypertension: Secondary | ICD-10-CM | POA: Diagnosis not present

## 2022-01-22 DIAGNOSIS — Z1231 Encounter for screening mammogram for malignant neoplasm of breast: Secondary | ICD-10-CM

## 2022-01-22 DIAGNOSIS — R5383 Other fatigue: Secondary | ICD-10-CM

## 2022-01-22 MED ORDER — FUROSEMIDE 40 MG PO TABS: ORAL_TABLET | ORAL | 3 refills | Status: DC

## 2022-01-22 NOTE — Patient Instructions (Signed)
Medication Instructions:  Your physician has recommended you make the following change in your medication:   INCREASE: furosemide (lasix) 40 mg tablet: Take 1 tablet by mouth twice a day for 3 days, then decrease to taking 1 tablet (40 mg) by mouth once a day  *If you need a refill on your cardiac medications before your next appointment, please call your pharmacy*   Lab Work: BMET on Friday 12/1  If you have labs (blood work) drawn today and your tests are completely normal, you will receive your results only by: [place] (if you have MyChart) OR A paper copy in the mail If you have any lab test that is abnormal or we need to change your treatment, we will call you to review the results.   Testing/Procedures: None  Follow-Up: At [facility], you and your health needs are our priority.  As part of our continuing mission to provide you with exceptional heart care, we have created designated Provider Care Teams.  These Care Teams include your primary Cardiologist (physician) and Advanced Practice Providers (APPs -  Physician Assistants and Nurse Practitioners) who all work together to provide you with the care you need, when you need it.  We recommend signing up for the patient portal called "MyChart".  Sign up information is provided on this After Visit Summary.  MyChart is used to connect with patients for Virtual Visits (Telemedicine).  Patients are able to view lab/test results, encounter notes, upcoming appointments, etc.  Non-urgent messages can be sent to your provider as well.   To learn more about what you can do with MyChart, go to [email or web address].    Your next appointment:   4-6 week(s)  The format for your next appointment:   In Person  Provider:   Larae Grooms, MD    Important Information About Sugar

## 2022-01-22 NOTE — Progress Notes (Signed)
Cardiology Office Note   Date:  01/22/2022   ID:  Cynthia Estrada, DOB 1945/09/18, MRN [id number]  PCP:  Burnis Medin, MD    No chief complaint on file.  LE edema  Wt Readings from Last 3 Encounters:  01/22/22 256 lb 12.8 oz (116.5 kg)  01/16/22 261 lb 9.6 oz (118.7 kg)  10/02/21 249 lb 3.2 oz (113 kg)       History of Present Illness: Cynthia Estrada is a 76 y.o. female  who I saw in 2021 for DOE, chest pressure.   "I saw her in December 2018.  At that time:"She has a history of hyperlipidemia, hypertension and renal insufficiency.   She had a routine ECG in October 2018 showing T-wave inversions in the anterior and lateral leads. Prior ECG in 2012 showed some T-wave inversions only in the anterior leads. Given the change, she was referred for further evaluation.    No family h/o heart disease.  She is not a smoker. No significant h/o cigarette smoking.    BP well controlled outside of MDs office.  Readings in the 128/80 range.    She has been statin intolerant.  She tried lipitor and pravastatin but does not recall any other medicine names.  She had muscle aches with both."   In December 2018, she had a stress test showing: "There was no ST segment deviation noted during stress. The study is normal. This is a low risk study.   Normal resting and stress perfusion. No ischemia or infarction  No gated images for EF "   In early May 2021, she felt like she could not get a deep breath and had some atypical chest discomfort. It was a tightness in her chest, throat and mouth.   She went to the emergency room.  ECG was unchanged from prior.  She had a negative troponin and she is seen here for follow-up.   She was treated with Lasix IV.    She works as a Building control surveyor. "  2021 echo showed: "Left ventricular ejection fraction, by estimation, is 60 to 65%. The  left ventricle has normal function. The left ventricle has no regional  wall motion abnormalities. Left  ventricular diastolic parameters are  consistent with Grade I diastolic  dysfunction (impaired relaxation).   2. Right ventricular systolic function is normal. The right ventricular  size is normal. Tricuspid regurgitation signal is inadequate for assessing  PA pressure.   3. The mitral valve is grossly normal. No evidence of mitral valve  regurgitation. No evidence of mitral stenosis.   4. The aortic valve is tricuspid. Aortic valve regurgitation is not  visualized. No aortic stenosis is present.   5. The inferior vena cava is normal in size with greater than 50%  respiratory variability, suggesting right atrial pressure of 3 mmHg. "  Sent back here today due to LE edema and shortness of breath.  Not exercising regularly.   Denies : Chest pain. Dizziness. Leg edema. Nitroglycerin use. Orthopnea. Palpitations. Paroxysmal nocturnal dyspnea. Shortness of breath. Syncope.    GERD sx better with protonix.     Past Medical History:  Diagnosis Date   ANXIETY 11/11/2006   Arthritis    BACK PAIN, LUMBAR 07/11/2008   Cataract    right eye removed    Chest pain 06/12/2010   Ok stress echo has Gi sx also cw gerd.    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/11/2009   Retinal detachment    SYNCOPE, HX OF 02/10/2007   UNSPECIFIED ARTHROPATHY SITE UNSPECIFIED 01/12/2007   Unspecified vitamin D deficiency 02/10/2007    Past Surgical History:  Procedure Laterality Date   ABDOMINAL HYSTERECTOMY     age 36 for fibroids   CATARACT EXTRACTION Right    done with retinal detachment repair    COLON SURGERY  08/2009   COLONOSCOPY  07/01/2009   Two tubulovillous adenoms 7 and 4 cm size    COLONOSCOPY W/ POLYPECTOMY  07/11/2010   two 7-8 mm polyps removed - adenomas   hemicollectomy  08/2009   Right for tubulovillous adenomas (2)   RETINAL DETACHMENT SURGERY       Current Outpatient Medications  Medication Sig Dispense Refill   Ascorbic Acid (VITAMIN C) 500 MG tablet Take 500 mg by mouth daily.     cholecalciferol (VITAMIN D3) 25 MCG (1000 UNIT) tablet Take 1,000 Units by mouth daily.     cyanocobalamin 100 MCG tablet Take 100 mcg by mouth as needed.     ezetimibe (ZETIA) 10 MG tablet Take 1 tablet by mouth once daily 90 tablet 0   furosemide (LASIX) 20 MG tablet Take 1 tablet (20 mg total) by mouth daily. 90 tablet 1   losartan (COZAAR) 50 MG tablet Take 1.5 tablets (75 mg total) by mouth daily. 135 tablet 1   pantoprazole (PROTONIX) 40 MG tablet TAKE 1 TABLET BY MOUTH ONCE DAILY *REPLACES  DEXILANT* 30 tablet 2   rosuvastatin (CRESTOR) 5 MG tablet Take 1 by mouth 2 x per week  can increase to 3 x per week as tolerated 45 tablet 3   ULORIC 40 MG tablet Take 40 mg by mouth daily.      No current facility-administered medications for this visit.   Facility-Administered Medications Ordered in Other Visits  Medication Dose Route Frequency Provider Last Rate Last Admin   technetium tetrofosmin (TC-MYOVIEW) injection 71.0 millicurie  62.6 millicurie Intravenous Once PRN Fay Records, MD        Allergies:   Simvastatin, Atorvastatin, Lisinopril, and Pravastatin    Social History:  The patient  reports that she has never smoked. She has never used smokeless tobacco. She reports that she does not drink alcohol and does not use drugs.   Family History:  The patient's family history includes Diabetes in her mother; Hyperlipidemia in her brother; Hypertension in her brother, mother, and sister. No early CAD in family   ROS:  Please see the history of present illness.   Otherwise, review of systems are positive for leg swelling.   All other systems are reviewed and negative.     PHYSICAL EXAM: VS:  BP 132/70   Pulse 80   Ht '5\' 4"'$  (1.626 m)   Wt 256 lb 12.8 oz (116.5 kg)   SpO2 94%   BMI 44.08 kg/m  , BMI Body mass index is 44.08 kg/m. GEN: Well nourished, well developed, in no acute distress HEENT: normal Neck: no JVD, carotid bruits, or masses Cardiac: RRR, premature beats; no murmurs, rubs, or gallops,; bilateral ankle edema , L>R, extending partway up the shin Respiratory:  clear to auscultation bilaterally, normal work of breathing GI: soft, nontender, nondistended, +  BS, obese MS: no deformity or atrophy Skin: warm and dry, no rash Neuro:  Strength and sensation are intact Psych: euthymic mood, full affect   EKG:   The ekg ordered today demonstrates NSR, nonspecific ST changes, no change from 2021   Recent Labs: 04/10/2021: ALT 18 05/04/2021: Hemoglobin 13.1; Platelets 181 07/02/2021: Magnesium 1.8 10/02/2021: BUN 16; Creatinine, Ser 1.40; Potassium 4.3; Sodium 140   Lipid Panel    Component Value Date/Time   CHOL 181 04/10/2021 0940   TRIG 133.0 04/10/2021 0940   TRIG 76 01/15/2006 0809   HDL 76.00 04/10/2021 0940   CHOLHDL 2 04/10/2021 0940   VLDL 26.6 04/10/2021 0940   LDLCALC 78 04/10/2021 0940   LDLCALC 160 (H) 02/02/2020 0952   LDLDIRECT 171.0 12/20/2015 0826     Other studies Reviewed: Additional studies/ records that were reviewed today with results demonstrating: Cr 1.4.    ASSESSMENT AND PLAN:  LE edema: I think this is likely from venous .  Insufficiency elevating legs and compression stockings should help.  Can try 10 to 20 mmHg up to the knee stockings.  Increase furosemide to 40 mg twice a day for 3 days.  Check electrolytes at the end of the week. Fatigue/DOE:  Looks volume overloaded. Needs more activity in general.  Check BNP as well.   I suspect some of her dyspnea on exertion is due to deconditioning.  She has several cardiac tests done over the past few years which have all been normal.  No clear anginal symptoms.   Will reassess when she is more euvolemic. Morbid obesity: Eating High-fiber foods. avoiding processed foods.  She lives alone and eats often at a senior center.  HTN:  Some high readings at home.  Try for more diuresis.    Current medicines are reviewed at length with the patient today.  The patient concerns regarding her medicines were addressed.  The following changes have been made:  Increase Lasix to 40 mg BID.  Labs/ tests ordered today include: BMet on Friday.  No orders of the defined types were placed in this encounter.   Recommend 150 minutes/week of aerobic exercise Low fat, low carb, high fiber diet recommended  Disposition:   FU in 4-6 weeks   Signed, Larae Grooms, MD  01/22/2022 3:11 PM    [facility] [address], [place], [place]  [address] Phone: [phone]; Fax: [phone]

## 2022-01-25 ENCOUNTER — Ambulatory Visit: Payer: Medicare HMO | Attending: Interventional Cardiology

## 2022-01-25 DIAGNOSIS — R600 Localized edema: Secondary | ICD-10-CM

## 2022-01-25 DIAGNOSIS — R5383 Other fatigue: Secondary | ICD-10-CM | POA: Diagnosis not present

## 2022-01-25 LAB — BASIC METABOLIC PANEL
BUN/Creatinine Ratio: 10 — ABNORMAL LOW (ref 12–28)
BUN: 14 mg/dL (ref 8–27)
CO2: 25 mmol/L (ref 20–29)
Calcium: 9.8 mg/dL (ref 8.7–10.3)
Chloride: 100 mmol/L (ref 96–106)
Creatinine, Ser: 1.35 mg/dL — ABNORMAL HIGH (ref 0.57–1.00)
Glucose: 95 mg/dL (ref 70–99)
Potassium: 3.4 mmol/L — ABNORMAL LOW (ref 3.5–5.2)
Sodium: 141 mmol/L (ref 134–144)
eGFR: 40 mL/min/{1.73_m2} — ABNORMAL LOW (ref >59)

## 2022-02-05 NOTE — Progress Notes (Signed)
02/06/2022 Cynthia Estrada [id number] [birth year]  Referring provider: Burnis Medin, MD Primary GI doctor: Dr. 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  With shared decision making we have decided to proceed with the colonoscopy since patient appears to be in good health at this time.  We have discussed the risks of bleeding, infection, perforation, medication reactions,  and remote risk of death associated with colonoscopy.  All questions were answered and the patient acknowledges these risk and wishes to proceed.  GERD Well controlled with medications Weight loss discussed for her GERD and joint pain   Patient Care Team: Panosh, Standley Brooking, MD as PCP - General Irish Lack Charlann Lange, MD as PCP - Cardiology (Cardiology) Gatha Mayer, MD (Gastroenterology) Addison Lank, MD as Referring Physician (Dermatology) Josue Hector, MD (Cardiology) Valinda Party, MD (Rheumatology) Jettie Booze, MD as Consulting Physician (Cardiology)  HISTORY OF PRESENT ILLNESS: 76 y.o. female with a past medical history of personal history hypertension, hyperlipidemia, renal insufficiency, of colon polyps with 7 cm and 4 cm tubulovillous adenomas resected with right hemicolectomy 2011 and others listed below presents for evaluation of screening colonoscopy.   2011 colonoscopy 7 cm and 4 cm tubulovillous adenoma status post right hemicolectomy 06/2010 colonoscopy 2 adenomatous polyps 7 mm removed 08/2013 colonoscopy diminutive polyp adenomatous descending colon 10/20/2018 colonoscopy with Dr. Carlean Purl due to personal history of colon polyps, good prep  with GoLytely, 3 sessile polyps rectum sigmoid and transverse diminutive.  Evidence of prior and in ileocolonic anastomosis transverse colon healthy mucosa.   3 adenomatous polyps, consider recall 2023 with health in mind.  She has knee, back and foot pain.  She walks with a cane, but otherwise is very independent, drives as her own ADLs. She follows with Dr. Irish Lack and had normal stress test 01/2017.  2021 echo EF 60 to 49%, grade 1 diastolic dysfunction, no AS.  Patient has a BM every day, had a few days of constipation with lasix but states this has gotten back to normal.  Patient denies change in bowel habits, constipation, diarrhea, hematochezia.  Denies changes in appetite. She has had some weight gain with her knee/back pain.   Patient denies GERD, protonix controls the medication. Patient denies dysphagia, nausea, vomiting, melena.    She  reports that she has never smoked. She has never used smokeless tobacco. She reports that she does not drink alcohol and does not use drugs.  Current Medications:     Current Outpatient Medications (Cardiovascular):    ezetimibe (ZETIA) 10 MG tablet, Take 1 tablet by mouth once daily   furosemide (LASIX) 40 MG tablet, Take 1 tablet (40 mg) by mouth twice a day for 3 days, then decrease to taking 1 tablet (40 mg) by mouth once a day   losartan (COZAAR) 50 MG tablet, Take 1.5 tablets (75 mg total) by mouth daily.   rosuvastatin (CRESTOR) 5 MG tablet, Take 1 by mouth 2 x per week  can increase to 3 x per week as tolerated     Current Outpatient Medications (Analgesics):    ULORIC 40 MG tablet, Take 40 mg by mouth daily.    Current Outpatient Medications (Hematological):  cyanocobalamin 100 MCG tablet, Take 100 mcg by mouth as needed.   Current Outpatient Medications (Other):    Ascorbic Acid (VITAMIN C) 500 MG tablet, Take 500 mg by mouth daily.   cholecalciferol (VITAMIN D3) 25 MCG (1000 UNIT) tablet, Take 1,000 Units by mouth  daily.   Na Sulfate-K Sulfate-Mg Sulf 17.5-3.13-1.6 GM/177ML SOLN, Take 1 kit by mouth once for 1 dose.   pantoprazole (PROTONIX) 40 MG tablet, TAKE 1 TABLET BY MOUTH ONCE DAILY *REPLACES  DEXILANT*   Facility-Administered Medications Ordered in Other Visits (Other):    technetium tetrofosmin (TC-MYOVIEW) injection 27.7 millicurie No current facility-administered medications for this visit.  Medical History:  Past Medical History:  Diagnosis Date   ANXIETY 11/11/2006   Arthritis    BACK PAIN, LUMBAR 07/11/2008   Cataract    right eye removed    Chest pain 06/12/2010   Ok stress echo has Gi sx also cw gerd.    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   Retinal detachment    SYNCOPE, HX OF 02/10/2007   UNSPECIFIED ARTHROPATHY SITE UNSPECIFIED 01/12/2007   Unspecified vitamin D deficiency 02/10/2007   Allergies:  Allergies  Allergen Reactions   Simvastatin Other (See Comments)    Myalgia  Slight elevation cpk 2013   Atorvastatin     myalgia   Lisinopril Other (See Comments)    REACTION: ? cough   Pravastatin Other (See Comments)    Muscle aches      Surgical History:  She  has a past surgical history that includes Abdominal hysterectomy; hemicollectomy (08/2009); Colonoscopy (07/01/2009); Colonoscopy w/ polypectomy (07/11/2010); Retinal detachment surgery; Cataract extraction (Right); and Colon surgery (08/2009). Family History:  Her family history includes Diabetes in her mother; Hyperlipidemia in her brother; Hypertension in her brother, mother, and sister.  REVIEW OF SYSTEMS  : All other systems reviewed and negative except where noted in the History of Present  Illness.  PHYSICAL EXAM: BP (!) 142/74   Pulse 79   Ht 5' 4" (1.626 m)   Wt 259 lb 2 oz (117.5 kg)   BMI 44.48 kg/m  General:   Pleasant, well developed female in no acute distress Head:   Normocephalic and atraumatic. Eyes:  sclerae anicteric,conjunctive pink  Heart:   regular rate and rhythm Pulm:  Clear anteriorly; no wheezing Abdomen:   Soft, Obese AB, Active bowel sounds. No tenderness . , No organomegaly appreciated. Rectal: Not evaluated Extremities:  Without edema. Msk: Symmetrical without gross deformities. Peripheral pulses intact.  Neurologic:  Alert and  oriented x4;  No focal deficits.  Skin:   Dry and intact without significant lesions or rashes. Psychiatric:  Cooperative. Normal mood and affect.  RELEVANT LABS AND IMAGING: CBC    Component Value Date/Time   WBC 4.3 05/04/2021 0000   WBC 5.3 04/10/2021 0940   RBC 4.79 04/10/2021 0940   HGB 13.1 05/04/2021 0000   HCT 40 05/04/2021 0000   PLT 181 05/04/2021 0000   MCV 82.6 04/10/2021 0940   MCH 27.2 02/02/2020 0952   MCHC 32.0 04/10/2021 0940   RDW 15.2 04/10/2021 0940   LYMPHSABS 1.4 04/10/2021 0940   MONOABS 0.9  04/10/2021 0940   EOSABS 0.1 04/10/2021 0940   BASOSABS 0.1 04/10/2021 0940    CMP     Component Value Date/Time   NA 141 01/25/2022 0828   K 3.4 (L) 01/25/2022 0828   CL 100 01/25/2022 0828   CO2 25 01/25/2022 0828   GLUCOSE 95 01/25/2022 0828   GLUCOSE 113 (H) 10/02/2021 1357   BUN 14 01/25/2022 0828   CREATININE 1.35 (H) 01/25/2022 0828   CREATININE 1.23 (H) 02/02/2020 0952   CALCIUM 9.8 01/25/2022 0828   PROT 6.8 04/10/2021 0940   ALBUMIN 4.4 05/04/2021 0000   AST 21 04/10/2021 0940   ALT 18 04/10/2021 0940   ALKPHOS 63 04/10/2021 0940   BILITOT 0.6 04/10/2021 0940   GFRNONAA 51.04 01/22/2021 0000   GFRNONAA 43 (L) 02/02/2020 0952   GFRAA 42.19 01/22/2021 0000   GFRAA 50 (L) 02/02/2020 0952     Vladimir Crofts, PA-C 12:12 PM

## 2022-02-06 ENCOUNTER — Encounter: Payer: Self-pay | Admitting: Physician Assistant

## 2022-02-06 ENCOUNTER — Ambulatory Visit: Payer: Medicare HMO | Admitting: Physician Assistant

## 2022-02-06 VITALS — BP 142/74 | HR 79 | Ht 64.0 in | Wt 259.1 lb

## 2022-02-06 DIAGNOSIS — Z86010 Personal history of colonic polyps: Secondary | ICD-10-CM

## 2022-02-06 DIAGNOSIS — K219 Gastro-esophageal reflux disease without esophagitis: Secondary | ICD-10-CM

## 2022-02-06 MED ORDER — NA SULFATE-K SULFATE-MG SULF 17.5-3.13-1.6 GM/177ML PO SOLN: 1.0000 | Freq: Once | ORAL | 0 refills | Status: AC

## 2022-02-06 NOTE — Patient Instructions (Signed)
_______________________________________________________  If you are age 77 or older, your body mass index should be between 23-30. Your Body mass index is 44.48 kg/m. If this is out of the aforementioned range listed, please consider follow up with your Primary Care Provider.  If you are age 33 or younger, your body mass index should be between 19-25. Your Body mass index is 44.48 kg/m. If this is out of the aformentioned range listed, please consider follow up with your Primary Care Provider.   ________________________________________________________  The [place] [facility] providers would like to encourage you to use [facility] to communicate with providers for non-urgent requests or questions.  Due to long hold times on the telephone, sending your provider a message by [facility] may be a faster and more efficient way to get a response.  Please allow 48 business hours for a response.  Please remember that this is for non-urgent requests.  _______________________________________________________  Dennis Bast have been scheduled for a colonoscopy. Please follow written instructions given to you at your visit today.  Please pick up your prep supplies at the pharmacy within the next 1-3 days. If you use inhalers (even only as needed), please bring them with you on the day of your procedure.   It was a pleasure to see you today!  Thank you for trusting me with your gastrointestinal care!

## 2022-03-05 NOTE — Progress Notes (Deleted)
Office Visit    Patient Name: Cynthia Estrada Date of Encounter: 03/05/2022  Primary Care Provider:  Burnis Medin, MD Primary Cardiologist:  Larae Grooms, MD Primary Electrophysiologist: None  Chief Complaint    NEHEMIAH ZADRA is a 77 y.o. female with PMH of HTN, HLD, renal insufficiency, dyspnea on exertion who presents today for 4-week follow-up of lower extremity edema and dyspnea on exertion.  Past Medical History    Past Medical History:  Diagnosis Date   ANXIETY 11/11/2006   Arthritis    BACK PAIN, LUMBAR 07/11/2008   Cataract    right eye removed    Chest pain 06/12/2010   Ok stress echo has Gi sx also cw gerd.    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   Retinal detachment    SYNCOPE, HX OF 02/10/2007   UNSPECIFIED ARTHROPATHY SITE UNSPECIFIED 01/12/2007   Unspecified vitamin D deficiency 02/10/2007   Past Surgical History:  Procedure Laterality Date   ABDOMINAL HYSTERECTOMY     age 60 for fibroids   CATARACT EXTRACTION Right    done with retinal detachment repair    COLON SURGERY  08/2009   COLONOSCOPY  07/01/2009   Two tubulovillous adenoms 7 and 4 cm size   COLONOSCOPY W/ POLYPECTOMY  07/11/2010   two 7-8 mm polyps removed - adenomas   hemicollectomy  08/2009   Right for tubulovillous adenomas (2)   RETINAL DETACHMENT SURGERY      Allergies  Allergies  Allergen Reactions   Simvastatin Other (See Comments)    Myalgia  Slight elevation cpk 2013   Atorvastatin     myalgia   Lisinopril Other (See Comments)    REACTION: ? cough   Pravastatin Other (See Comments)    Muscle aches     History of Present Illness    Cynthia Estrada  is a 77 year old female with the above mention past medical history who presents today for follow-up of dyspnea on exertion and lower extremity edema.  Ms. Fackelman was initially seen by Dr.Varanasi in 2018 for abnormal EKG.  She underwent Lexiscan Myoview that revealed normal findings and no ischemia.  She presented to the ED in 5/thousand 21 with complaint of atypical chest pain and tightness in her throat and mouth.  She ruled out for ACS with negative troponins and was treated with Lasix IV for elevated volume.  2D echo was completed showed EF of 60 to 65%, no RWMA, grade 1 DD with normal valve function.  She was last seen by Dr. Irish Lack in 12/2021 for complaint of shortness of breath and lower extremity edema.  Edema was felt to be likely venous and patient was advised to use compression stockings and Lasix was prescribed for 40 mg twice daily x 3 days.  She had lower extremity DVTs completed in 06/2021 that were negative for DVT.  Since last being seen in the office patient reports***.  Patient denies chest pain, palpitations, dyspnea, PND, orthopnea, nausea, vomiting, dizziness, syncope, edema, weight gain, or early satiety.     ***Notes:  Home Medications    Current Outpatient Medications  Medication Sig Dispense  Refill   Ascorbic Acid (VITAMIN C) 500 MG tablet Take 500 mg by mouth daily.     cholecalciferol (VITAMIN D3) 25 MCG (1000 UNIT) tablet Take 1,000 Units by mouth daily.     cyanocobalamin 100 MCG tablet Take 100 mcg by mouth as needed.     ezetimibe (ZETIA) 10 MG tablet Take 1 tablet by mouth once daily 90 tablet 0   furosemide (LASIX) 40 MG tablet Take 1 tablet (40 mg) by mouth twice a day for 3 days, then decrease to taking 1 tablet (40 mg) by mouth once a day 93 tablet 3   losartan (COZAAR) 50 MG tablet Take 1.5 tablets (75 mg total) by mouth daily. 135 tablet 1   pantoprazole (PROTONIX) 40 MG tablet TAKE 1 TABLET BY MOUTH ONCE DAILY *REPLACES  DEXILANT* 30 tablet 2    rosuvastatin (CRESTOR) 5 MG tablet Take 1 by mouth 2 x per week  can increase to 3 x per week as tolerated 45 tablet 3   ULORIC 40 MG tablet Take 40 mg by mouth daily.      No current facility-administered medications for this visit.   Facility-Administered Medications Ordered in Other Visits  Medication Dose Route Frequency Provider Last Rate Last Admin   technetium tetrofosmin (TC-MYOVIEW) injection [id number] millicurie  [id number] millicurie Intravenous Once PRN Fay Records, MD         Review of Systems  Please see the history of present illness.    (+)*** (+)***  All other systems reviewed and are otherwise negative except as noted above.  Physical Exam    Wt Readings from Last 3 Encounters:  02/06/22 259 lb 2 oz (117.5 kg)  01/22/22 256 lb 12.8 oz (116.5 kg)  01/16/22 261 lb 9.6 oz (118.7 kg)   [id number] were no vitals filed for this visit.,There is no height or weight on file to calculate BMI.  Constitutional:      Appearance: Healthy appearance. Not in distress.  Neck:     Vascular: JVD normal.  Pulmonary:     Effort: Pulmonary effort is normal.     Breath sounds: No wheezing. No rales. Diminished in the bases Cardiovascular:     Normal rate. Regular rhythm. Normal S1. Normal S2.      Murmurs: There is no murmur.  Edema:    Peripheral edema absent.  Abdominal:     Palpations: Abdomen is soft non tender. There is no hepatomegaly.  Skin:    General: Skin is warm and dry.  Neurological:     General: No focal deficit present.     Mental Status: Alert and oriented to person, place and time.     Cranial Nerves: Cranial nerves are intact.  EKG/LABS/Other Studies Reviewed    ECG personally reviewed by me today - ***  Risk Assessment/Calculations:   {Does this patient have ATRIAL FIBRILLATION?[phone]}        Lab Results  Component Value Date   WBC 4.3 05/04/2021   HGB 13.1 05/04/2021   HCT 40 05/04/2021   MCV 82.6 04/10/2021   PLT 181 05/04/2021   Lab Results   Component Value Date   CREATININE 1.35 (H) 01/25/2022   BUN 14 01/25/2022   NA 141 01/25/2022   K 3.4 (L) 01/25/2022   CL 100 01/25/2022   CO2 25 01/25/2022   Lab Results  Component Value Date   ALT 18 04/10/2021   AST 21 04/10/2021   ALKPHOS 63 04/10/2021   BILITOT 0.6 04/10/2021  Lab Results  Component Value Date   CHOL 181 04/10/2021   HDL 76.00 04/10/2021   LDLCALC 78 04/10/2021   LDLDIRECT 171.0 12/20/2015   TRIG 133.0 04/10/2021   CHOLHDL 2 04/10/2021    Lab Results  Component Value Date   HGBA1C 5.8 (A) 01/16/2022    Assessment & Plan    1.  Lower extremity edema: -  2.  Dyspnea on exertion: - 3.  Essential hypertension -Patient's blood pressure today was*** -Continue  4.  Hyperlipidemia: -Patient's last LDL was*** -Continue Crestor 5 mg daily and ezetimibe 10 mg daily     Disposition: Follow-up with Larae Grooms, MD or APP in *** months {Are you ordering a CV Procedure (e.g. stress test, cath, DCCV, TEE, etc)?   Press F2        :[id number]   Medication Adjustments/Labs and Tests Ordered: Current medicines are reviewed at length with the patient today.  Concerns regarding medicines are outlined above.   Signed, Mable Fill, Marissa Nestle, NP 03/05/2022, 9:17 PM [facility]  Note:  This document was prepared using Dragon voice recognition software and may include unintentional dictation errors.

## 2022-03-06 ENCOUNTER — Ambulatory Visit: Payer: Medicare HMO | Admitting: Nurse Practitioner

## 2022-03-06 DIAGNOSIS — R0609 Other forms of dyspnea: Secondary | ICD-10-CM

## 2022-03-06 DIAGNOSIS — I10 Essential (primary) hypertension: Secondary | ICD-10-CM

## 2022-03-06 DIAGNOSIS — E782 Mixed hyperlipidemia: Secondary | ICD-10-CM

## 2022-03-06 DIAGNOSIS — R600 Localized edema: Secondary | ICD-10-CM

## 2022-03-12 ENCOUNTER — Encounter: Payer: Self-pay | Admitting: Internal Medicine

## 2022-03-15 DIAGNOSIS — Z6841 Body Mass Index (BMI) 40.0 and over, adult: Secondary | ICD-10-CM | POA: Diagnosis not present

## 2022-03-15 DIAGNOSIS — I10 Essential (primary) hypertension: Secondary | ICD-10-CM | POA: Diagnosis not present

## 2022-03-15 DIAGNOSIS — M109 Gout, unspecified: Secondary | ICD-10-CM | POA: Diagnosis not present

## 2022-03-15 DIAGNOSIS — Z0000 Encounter for general adult medical examination without abnormal findings: Secondary | ICD-10-CM | POA: Diagnosis not present

## 2022-03-15 DIAGNOSIS — R600 Localized edema: Secondary | ICD-10-CM | POA: Diagnosis not present

## 2022-03-15 DIAGNOSIS — E785 Hyperlipidemia, unspecified: Secondary | ICD-10-CM | POA: Diagnosis not present

## 2022-03-15 DIAGNOSIS — M069 Rheumatoid arthritis, unspecified: Secondary | ICD-10-CM | POA: Diagnosis not present

## 2022-03-19 ENCOUNTER — Encounter: Payer: Self-pay | Admitting: Internal Medicine

## 2022-03-19 ENCOUNTER — Ambulatory Visit (AMBULATORY_SURGERY_CENTER): Payer: Medicare HMO | Admitting: Internal Medicine

## 2022-03-19 VITALS — BP 132/73 | HR 65 | Temp 98.3°F | Resp 21 | Ht 64.0 in | Wt 259.0 lb

## 2022-03-19 DIAGNOSIS — Z86010 Personal history of colonic polyps: Secondary | ICD-10-CM

## 2022-03-19 DIAGNOSIS — D124 Benign neoplasm of descending colon: Secondary | ICD-10-CM

## 2022-03-19 DIAGNOSIS — R69 Illness, unspecified: Secondary | ICD-10-CM | POA: Diagnosis not present

## 2022-03-19 DIAGNOSIS — K635 Polyp of colon: Secondary | ICD-10-CM | POA: Diagnosis not present

## 2022-03-19 DIAGNOSIS — I10 Essential (primary) hypertension: Secondary | ICD-10-CM | POA: Diagnosis not present

## 2022-03-19 DIAGNOSIS — E7800 Pure hypercholesterolemia, unspecified: Secondary | ICD-10-CM | POA: Diagnosis not present

## 2022-03-19 DIAGNOSIS — D128 Benign neoplasm of rectum: Secondary | ICD-10-CM | POA: Diagnosis not present

## 2022-03-19 DIAGNOSIS — Z09 Encounter for follow-up examination after completed treatment for conditions other than malignant neoplasm: Secondary | ICD-10-CM | POA: Diagnosis not present

## 2022-03-19 DIAGNOSIS — D125 Benign neoplasm of sigmoid colon: Secondary | ICD-10-CM

## 2022-03-19 DIAGNOSIS — K621 Rectal polyp: Secondary | ICD-10-CM | POA: Diagnosis not present

## 2022-03-19 MED ORDER — SODIUM CHLORIDE 0.9 % IV SOLN: 500.0000 mL | INTRAVENOUS | Status: DC

## 2022-03-19 NOTE — Progress Notes (Signed)
To pacu, VSS. Report to [email or web address] 

## 2022-03-19 NOTE — Op Note (Addendum)
Cynthia Estrada Patient Name: Cynthia Estrada Procedure Date: 03/19/2022 1:38 PM MRN: [id number] Endoscopist: Gatha Mayer , MD, [id number] Age: 77 Referring MD:  Date of Birth: 02/12/1946 Gender: Female Account #: [id number] Procedure:                Colonoscopy Indications:              Last colonoscopy: August 2020 Medicines:                Monitored Anesthesia Care Procedure:                Pre-Anesthesia Assessment:                           - Prior to the procedure, a History and Physical                            was performed, and patient medications and                            allergies were reviewed. The patient's tolerance of                            previous anesthesia was also reviewed. The risks                            and benefits of the procedure and the sedation                            options and risks were discussed with the patient.                            All questions were answered, and informed consent                            was obtained. Prior Anticoagulants: The patient has                            taken no anticoagulant or antiplatelet agents. ASA                            Grade Assessment: III - A patient with severe                            systemic disease. After reviewing the risks and                            benefits, the patient was deemed in satisfactory                            condition to undergo the procedure.                           After obtaining informed consent, the colonoscope  was passed under direct vision. Throughout the                            procedure, the patient's blood pressure, pulse, and                            oxygen saturations were monitored continuously. The                            Olympus PCF-H190DL ([id number]) Colonoscope was                            introduced through the anus and advanced to the the                            ileocolonic anastomosis.  The colonoscopy was                            performed without difficulty. The patient tolerated                            the procedure well. The quality of the bowel                            preparation was good. The rectum and Ileocolonic                            anastomsis areas were photographed. Scope In: 1:46:39 PM Scope Out: 2:03:22 PM Scope Withdrawal Time: 0 hours 13 minutes 11 seconds  Total Procedure Duration: 0 hours 16 minutes 43 seconds  Findings:                 The perianal and digital rectal examinations were                            normal.                           Three sessile polyps were found in the rectum,                            sigmoid colon and descending colon. The polyps were                            diminutive in size. These polyps were removed with                            a cold snare. Resection and retrieval were                            complete. Verification of patient identification                            for the specimen was done. Estimated blood loss was  minimal.                           There was evidence of a prior end-to-side                            ileo-colonic anastomosis in the transverse colon.                            This was patent and was characterized by healthy                            appearing mucosa. The anastomosis was not traversed.                           The exam was otherwise without abnormality on                            direct and retroflexion views. Complications:            No immediate complications. Estimated Blood Loss:     Estimated blood loss was minimal. Impression:               - Three diminutive polyps in the rectum, in the                            sigmoid colon and in the descending colon, removed                            with a cold snare. Resected and retrieved.                           - Patent end-to-side ileo-colonic anastomosis,                             characterized by healthy appearing mucosa.                           - The examination was otherwise normal on direct                            and retroflexion views.                           - Personal history of colonic polyps. 2011 - 7 cm                            and 4 cm TV adenomas resected with right                            hemicolectomy                           06/2010 2 7 mm adenomas removed  09/21/2013 diminutive polyp removed descending colon                            adenoma                           10/20/2018 3 diminutive polyps removed all adenomas Recommendation:           - Patient has a contact number available for                            emergencies. The signs and symptoms of potential                            delayed complications were discussed with the                            patient. Return to normal activities tomorrow.                            Written discharge instructions were provided to the                            patient.                           - Resume previous diet.                           - Continue present medications.                           - No repeat colonoscopy. Gatha Mayer, MD 03/19/2022 2:14:18 PM This report has been signed electronically. Addendum Number: 1   Addendum Date: 03/26/2022 3:48:45 PM      Indication for procedure: History of colon polyps Gatha Mayer, MD 03/26/2022 3:49:24 PM This report has been signed electronically.

## 2022-03-19 NOTE — Patient Instructions (Addendum)
I found and removed 3 tiny polyps that look benign. I will have them analyzed and let you know results. Not planning on any more routine colonoscopy testing.  I appreciate the opportunity to care for you. Gatha Mayer, MD, [facility]   Handout out on polyps.  Resume previous diet, continue present medications.   YOU HAD AN ENDOSCOPIC PROCEDURE TODAY AT [facility]:   Refer to the procedure report that was given to you for any specific questions about what was found during the examination.  If the procedure report does not answer your questions, please call your gastroenterologist to clarify.  If you requested that your care partner not be given the details of your procedure findings, then the procedure report has been included in a sealed envelope for you to review at your convenience later.  YOU SHOULD EXPECT: Some feelings of bloating in the abdomen. Passage of more gas than usual.  Walking can help get rid of the air that was put into your GI tract during the procedure and reduce the bloating. If you had a lower endoscopy (such as a colonoscopy or flexible sigmoidoscopy) you may notice spotting of blood in your stool or on the toilet paper. If you underwent a bowel prep for your procedure, you may not have a normal bowel movement for a few days.  Please Note:  You might notice some irritation and congestion in your nose or some drainage.  This is from the oxygen used during your procedure.  There is no need for concern and it should clear up in a day or so.  SYMPTOMS TO REPORT IMMEDIATELY:  Following lower endoscopy (colonoscopy or flexible sigmoidoscopy):  Excessive amounts of blood in the stool  Significant tenderness or worsening of abdominal pains  Swelling of the abdomen that is new, acute  Fever of 100F or higher   For urgent or emergent issues, a gastroenterologist can be reached at any hour by calling [phone]. Do not use MyChart messaging for urgent  concerns.    DIET:  We do recommend a small meal at first, but then you may proceed to your regular diet.  Drink plenty of fluids but you should avoid alcoholic beverages for 24 hours.  ACTIVITY:  You should plan to take it easy for the rest of today and you should NOT DRIVE or use heavy machinery until tomorrow (because of the sedation medicines used during the test).    FOLLOW UP: Our staff will call the number listed on your records the next business day following your procedure.  We will call around 7:15- 8:00 am to check on you and address any questions or concerns that you may have regarding the information given to you following your procedure. If we do not reach you, we will leave a message.     If any biopsies were taken you will be contacted by phone or by letter within the next 1-3 weeks.  Please call us at [phone] if you have not heard about the biopsies in 3 weeks.    SIGNATURES/CONFIDENTIALITY: You and/or your care partner have signed paperwork which will be entered into your electronic medical record.  These signatures attest to the fact that that the information above on your After Visit Summary has been reviewed and is understood.  Full responsibility of the confidentiality of this discharge information lies with you and/or your care-partner.

## 2022-03-19 NOTE — Progress Notes (Signed)
Called to room to assist during endoscopic procedure.  Patient ID and intended procedure confirmed with present staff. Received instructions for my participation in the procedure from the performing physician.  

## 2022-03-19 NOTE — Progress Notes (Signed)
[facility] History and Physical   Primary Care Physician:  Burnis Medin, MD   Reason for Procedure:   Hx colon polyps  Plan:    colonoscopy     HPI: Cynthia Estrada is a 77 y.o. female here for surveillance colonoscopy  Seen 02/06/22 in office Colonoscopy 2011 7 and 4 cm tubulovillous adenoma status post right hemicolectomy 3 subsequent colonoscopies with multiple diminutive adenomatous polyps 2020 colonoscopy 3 diminutive adenomatous polyps We had a long discussion about the purpose of colon cancer screening, including the benefits and risk with her history.  With shared decision making we have decided to proceed with the colonoscopy since patient appears to be in good health at this time.  We have discussed the risks of bleeding, infection, perforation, medication reactions,  and remote risk of death associated with colonoscopy.  All questions were answered and the patient acknowledges these risk and wishes to proceed.    Past Medical History:  Diagnosis Date   ANXIETY 11/11/2006   Arthritis    BACK PAIN, LUMBAR 07/11/2008   Cataract    right eye removed    Chest pain 06/12/2010   Ok stress echo has Gi sx also cw gerd.    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   Retinal detachment    SYNCOPE, HX OF 02/10/2007   UNSPECIFIED ARTHROPATHY SITE UNSPECIFIED 01/12/2007   Unspecified vitamin D deficiency 02/10/2007    Past Surgical History:  Procedure Laterality Date   ABDOMINAL HYSTERECTOMY     age 19 for fibroids   CATARACT EXTRACTION Right    done with retinal detachment repair    COLON SURGERY  08/2009   COLONOSCOPY  07/01/2009   Two  tubulovillous adenoms 7 and 4 cm size   COLONOSCOPY W/ POLYPECTOMY  07/11/2010   two 7-8 mm polyps removed - adenomas   hemicollectomy  08/2009   Right for tubulovillous adenomas (2)   RETINAL DETACHMENT SURGERY      Prior to Admission medications   Medication Sig Start Date End Date Taking? Authorizing Provider  cholecalciferol (VITAMIN D3) 25 MCG (1000 UNIT) tablet Take 1,000 Units by mouth daily.   Yes [provider]  cyanocobalamin 100 MCG tablet Take 100 mcg by mouth as needed.   Yes [provider]  ezetimibe (ZETIA) 10 MG tablet Take 1 tablet by mouth once daily 12/10/21  Yes Panosh, Standley Brooking, MD  furosemide (LASIX) 40 MG tablet Take 1 tablet (40 mg) by mouth twice a day for 3 days, then decrease to taking 1 tablet (40 mg) by mouth once a day 01/22/22  Yes Jettie Booze, MD  losartan (COZAAR) 50 MG tablet Take 1.5 tablets (75 mg total) by mouth daily. 10/02/21  Yes Panosh, Standley Brooking, MD  pantoprazole (PROTONIX) 40 MG tablet TAKE 1 TABLET BY MOUTH ONCE DAILY *REPLACES  DEXILANT* 01/14/22  Yes Panosh, Standley Brooking, MD  rosuvastatin (CRESTOR) 5 MG tablet Take 1 by mouth 2 x per week  can increase to 3 x per week as tolerated 01/09/21  Yes Panosh, Standley Brooking, MD  ULORIC 40 MG tablet Take 40 mg by mouth daily.  12/10/16  Yes [provider]  Ascorbic Acid (VITAMIN C) 500 MG tablet Take 500 mg by mouth daily.    [provider]    Current Outpatient Medications  Medication Sig Dispense Refill   cholecalciferol (VITAMIN D3) 25 MCG (1000 UNIT) tablet Take 1,000 Units by mouth daily.     cyanocobalamin 100 MCG tablet Take 100 mcg by mouth as needed.     ezetimibe (ZETIA) 10 MG tablet Take 1 tablet by mouth once daily 90 tablet 0   furosemide (LASIX) 40 MG tablet Take 1 tablet (40 mg) by mouth twice a day for 3 days, then decrease to taking 1 tablet (40 mg) by mouth once a day 93 tablet 3   losartan (COZAAR) 50 MG tablet Take 1.5 tablets (75 mg total) by mouth  daily. 135 tablet 1   pantoprazole (PROTONIX) 40 MG tablet TAKE 1 TABLET BY MOUTH ONCE DAILY *REPLACES  DEXILANT* 30 tablet 2   rosuvastatin (CRESTOR) 5 MG tablet Take 1 by mouth 2 x per week  can increase to 3 x per week as tolerated 45 tablet 3   ULORIC 40 MG tablet Take 40 mg by mouth daily.      Ascorbic Acid (VITAMIN C) 500 MG tablet Take 500 mg by mouth daily.     Current Facility-Administered Medications  Medication Dose Route Frequency Provider Last Rate Last Admin   0.9 %  sodium chloride infusion  500 mL Intravenous Continuous Gatha Mayer, MD       Facility-Administered Medications Ordered in Other Visits  Medication Dose Route Frequency Provider Last Rate Last Admin   technetium tetrofosmin (TC-MYOVIEW) injection 09.6 millicurie  28.3 millicurie Intravenous Once PRN Fay Records, MD        Allergies as of 03/19/2022 - Review Complete 03/19/2022  Allergen Reaction Noted   Simvastatin Other (See Comments) 01/02/2012   Atorvastatin  01/02/2012   Lisinopril Other (See Comments) 11/10/2008   Pravastatin Other (See Comments) 03/07/2015    Family History  Problem Relation Age of Onset   Hypertension Mother    Diabetes Mother    Hypertension Sister    Hypertension Brother    Hyperlipidemia Brother    Colon cancer Neg Hx    Pancreatic cancer Neg Hx    Rectal cancer Neg Hx    Stomach cancer Neg Hx    Colon polyps Neg Hx    Breast cancer Neg Hx    Esophageal cancer Neg Hx     Social History   Socioeconomic History   Marital status: Single    Spouse name: Not on file   Number of children: Not on file   Years of education: Not on file   Highest education level: Not on file  Occupational History   Occupation: Retire    Comment: [employer]  Tobacco Use   Smoking status: Never   Smokeless tobacco: Never  Vaping Use   Vaping Use: Never used  Substance and Sexual Activity   Alcohol use: No   Drug use: No   Sexual activity: Not on file  Other Topics Concern    Not on file  Social History Narrative   HH of 1    No pets   Retired from [employer] walking  onlylimited by knee oa changes    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    Social Determinants of Health   Financial Resource Strain: Medium Risk (  11/19/2021)   Overall Financial Resource Strain (CARDIA)    Difficulty of Paying Living Expenses: Somewhat hard  Food Insecurity: No Food Insecurity (11/19/2021)   Hunger Vital Sign    Worried About Running Out of Food in the Last Year: Never true    Ran Out of Food in the Last Year: Never true  Transportation Needs: No Transportation Needs (11/19/2021)   PRAPARE - Hydrologist (Medical): No    Lack of Transportation (Non-Medical): No  Physical Activity: Insufficiently Active (04/05/2021)   Exercise Vital Sign    Days of Exercise per Week: 2 days    Minutes of Exercise per Session: 10 min  Stress: No Stress Concern Present (04/05/2021)   [place]    Feeling of Stress : Not at all  Social Connections: Moderately Integrated (04/05/2021)   Social Connection and Isolation Panel [NHANES]    Frequency of Communication with Friends and Family: More than three times a week    Frequency of Social Gatherings with Friends and Family: More than three times a week    Attends Religious Services: More than 4 times per year    Active Member of [employer] or Organizations: Yes    Attends Archivist Meetings: More than 4 times per year    Marital Status: Never married  Intimate Partner Violence: Not At Risk (04/05/2021)   Humiliation, Afraid, Rape, and Kick questionnaire    Fear of Current or Ex-Partner: No    Emotionally Abused: No    Physically Abused: No    Sexually Abused: No    Review of Systems:  All other review of systems negative except as mentioned in the HPI.  Physical Exam: Vital  signs BP (!) 163/76   Pulse 70   Temp 98.3 F (36.8 C)   Ht '5\' 4"'$  (1.626 m)   Wt 259 lb (117.5 kg)   SpO2 98%   BMI 44.46 kg/m   General:   Alert,  Well-developed, well-nourished, pleasant and cooperative in NAD Lungs:  Clear throughout to auscultation.   Heart:  Regular rate and rhythm; no murmurs, clicks, rubs,  or gallops. Abdomen:  Soft, nontender and nondistended. Normal bowel sounds.   Neuro/Psych:  Alert and cooperative. Normal mood and affect. A and O x 3   '@Deadrian Toya'$  Simonne Maffucci, MD, [facility] [phone] (pager) 03/19/2022 1:34 PM@

## 2022-03-20 ENCOUNTER — Telehealth: Payer: Self-pay

## 2022-03-20 NOTE — Telephone Encounter (Signed)
  Follow up Call-     03/19/2022   12:30 PM  Call back number  Post procedure Call Back phone  # [phone]  Permission to leave phone message Yes     Patient questions:  Do you have a fever, pain , or abdominal swelling? No. Pain Score  0 *  Have you tolerated food without any problems? Yes.    Have you been able to return to your normal activities? Yes.    Do you have any questions about your discharge instructions: Diet   No. Medications  No. Follow up visit  No.  Do you have questions or concerns about your Care? No.  Actions: * If pain score is 4 or above: No action needed, pain <4.

## 2022-03-24 ENCOUNTER — Other Ambulatory Visit: Payer: Self-pay | Admitting: Internal Medicine

## 2022-03-28 ENCOUNTER — Encounter: Payer: Self-pay | Admitting: Internal Medicine

## 2022-04-02 ENCOUNTER — Other Ambulatory Visit: Payer: Self-pay | Admitting: Internal Medicine

## 2022-04-17 ENCOUNTER — Ambulatory Visit (INDEPENDENT_AMBULATORY_CARE_PROVIDER_SITE_OTHER): Payer: Medicare HMO

## 2022-04-17 VITALS — BP 118/62 | HR 72 | Temp 97.9°F | Ht 64.0 in | Wt 249.3 lb

## 2022-04-17 DIAGNOSIS — Z0000 Encounter for general adult medical examination without abnormal findings: Secondary | ICD-10-CM

## 2022-04-17 NOTE — Patient Instructions (Addendum)
Cynthia Estrada , Thank you for taking time to come for your Medicare Wellness Visit. I appreciate your ongoing commitment to your health goals. Please review the following plan we discussed and let me know if I can assist you in the future.   These are the goals we discussed:  Goals       No current goals (pt-stated)        This is a list of the screening recommended for you and due dates:  Health Maintenance  Topic Date Due   DTaP/Tdap/Td vaccine (2 - Tdap) 10/17/2019   COVID-19 Vaccine (3 - Pfizer risk series) 05/03/2022*   Zoster (Shingles) Vaccine (1 of 2) 07/16/2022*   Mammogram  01/23/2023   Medicare Annual Wellness Visit  04/18/2023   Pneumonia Vaccine  Completed   Flu Shot  Completed   DEXA scan (bone density measurement)  Completed   Hepatitis C Screening: USPSTF Recommendation to screen - Ages 82-79 yo.  Completed   HPV Vaccine  Aged Out   Colon Cancer Screening  Discontinued  *Topic was postponed. The date shown is not the original due date.    Advanced directives: Advance directive discussed with you today. Even though you declined this today, please call our office should you change your mind, and we can give you the proper paperwork for you to fill out.   Conditions/risks identified: None  Next appointment: Follow up in one year for your annual wellness visit     Preventive Care 65 Years and Older, Female Preventive care refers to lifestyle choices and visits with your health care provider that can promote health and wellness. What does preventive care include? A yearly physical exam. This is also called an annual well check. Dental exams once or twice a year. Routine eye exams. Ask your health care provider how often you should have your eyes checked. Personal lifestyle choices, including: Daily care of your teeth and gums. Regular physical activity. Eating a healthy diet. Avoiding tobacco and drug use. Limiting alcohol use. Practicing safe sex. Taking  low-dose aspirin every day. Taking vitamin and mineral supplements as recommended by your health care provider. What happens during an annual well check? The services and screenings done by your health care provider during your annual well check will depend on your age, overall health, lifestyle risk factors, and family history of disease. Counseling  Your health care provider may ask you questions about your: Alcohol use. Tobacco use. Drug use. Emotional well-being. Home and relationship well-being. Sexual activity. Eating habits. History of falls. Memory and ability to understand (cognition). Work and work Statistician. Reproductive health. Screening  You may have the following tests or measurements: Height, weight, and BMI. Blood pressure. Lipid and cholesterol levels. These may be checked every 5 years, or more frequently if you are over 37 years old. Skin check. Lung cancer screening. You may have this screening every year starting at age 8 if you have a 30-pack-year history of smoking and currently smoke or have quit within the past 15 years. Fecal occult blood test (FOBT) of the stool. You may have this test every year starting at age 72. Flexible sigmoidoscopy or colonoscopy. You may have a sigmoidoscopy every 5 years or a colonoscopy every 10 years starting at age 29. Hepatitis C blood test. Hepatitis B blood test. Sexually transmitted disease (STD) testing. Diabetes screening. This is done by checking your blood sugar (glucose) after you have not eaten for a while (fasting). You may have this done every 1-3  years. Bone density scan. This is done to screen for osteoporosis. You may have this done starting at age 72. Mammogram. This may be done every 1-2 years. Talk to your health care provider about how often you should have regular mammograms. Talk with your health care provider about your test results, treatment options, and if necessary, the need for more tests. Vaccines   Your health care provider may recommend certain vaccines, such as: Influenza vaccine. This is recommended every year. Tetanus, diphtheria, and acellular pertussis (Tdap, Td) vaccine. You may need a Td booster every 10 years. Zoster vaccine. You may need this after age 61. Pneumococcal 13-valent conjugate (PCV13) vaccine. One dose is recommended after age 66. Pneumococcal polysaccharide (PPSV23) vaccine. One dose is recommended after age 57. Talk to your health care provider about which screenings and vaccines you need and how often you need them. This information is not intended to replace advice given to you by your health care provider. Make sure you discuss any questions you have with your health care provider. Document Released: 03/10/2015 Document Revised: 11/01/2015 Document Reviewed: 12/13/2014 Elsevier Interactive Patient Education  2017 [employer] Prevention in the Home Falls can cause injuries. They can happen to people of all ages. There are many things you can do to make your home safe and to help prevent falls. What can I do on the outside of my home? Regularly fix the edges of walkways and driveways and fix any cracks. Remove anything that might make you trip as you walk through a door, such as a raised step or threshold. Trim any bushes or trees on the path to your home. Use bright outdoor lighting. Clear any walking paths of anything that might make someone trip, such as rocks or tools. Regularly check to see if handrails are loose or broken. Make sure that both sides of any steps have handrails. Any raised decks and porches should have guardrails on the edges. Have any leaves, snow, or ice cleared regularly. Use sand or salt on walking paths during winter. Clean up any spills in your garage right away. This includes oil or grease spills. What can I do in the bathroom? Use night lights. Install grab bars by the toilet and in the tub and shower. Do not use towel  bars as grab bars. Use non-skid mats or decals in the tub or shower. If you need to sit down in the shower, use a plastic, non-slip stool. Keep the floor dry. Clean up any water that spills on the floor as soon as it happens. Remove soap buildup in the tub or shower regularly. Attach bath mats securely with double-sided non-slip rug tape. Do not have throw rugs and other things on the floor that can make you trip. What can I do in the bedroom? Use night lights. Make sure that you have a light by your bed that is easy to reach. Do not use any sheets or blankets that are too big for your bed. They should not hang down onto the floor. Have a firm chair that has side arms. You can use this for support while you get dressed. Do not have throw rugs and other things on the floor that can make you trip. What can I do in the kitchen? Clean up any spills right away. Avoid walking on wet floors. Keep items that you use a lot in easy-to-reach places. If you need to reach something above you, use a strong step stool that has a grab  bar. Keep electrical cords out of the way. Do not use floor polish or wax that makes floors slippery. If you must use wax, use non-skid floor wax. Do not have throw rugs and other things on the floor that can make you trip. What can I do with my stairs? Do not leave any items on the stairs. Make sure that there are handrails on both sides of the stairs and use them. Fix handrails that are broken or loose. Make sure that handrails are as long as the stairways. Check any carpeting to make sure that it is firmly attached to the stairs. Fix any carpet that is loose or worn. Avoid having throw rugs at the top or bottom of the stairs. If you do have throw rugs, attach them to the floor with carpet tape. Make sure that you have a light switch at the top of the stairs and the bottom of the stairs. If you do not have them, ask someone to add them for you. What else can I do to help  prevent falls? Wear shoes that: Do not have high heels. Have rubber bottoms. Are comfortable and fit you well. Are closed at the toe. Do not wear sandals. If you use a stepladder: Make sure that it is fully opened. Do not climb a closed stepladder. Make sure that both sides of the stepladder are locked into place. Ask someone to hold it for you, if possible. Clearly mark and make sure that you can see: Any grab bars or handrails. First and last steps. Where the edge of each step is. Use tools that help you move around (mobility aids) if they are needed. These include: Canes. Walkers. Scooters. Crutches. Turn on the lights when you go into a dark area. Replace any light bulbs as soon as they burn out. Set up your furniture so you have a clear path. Avoid moving your furniture around. If any of your floors are uneven, fix them. If there are any pets around you, be aware of where they are. Review your medicines with your doctor. Some medicines can make you feel dizzy. This can increase your chance of falling. Ask your doctor what other things that you can do to help prevent falls. This information is not intended to replace advice given to you by your health care provider. Make sure you discuss any questions you have with your health care provider. Document Released: 12/08/2008 Document Revised: 07/20/2015 Document Reviewed: 03/18/2014 Elsevier Interactive Patient Education  2017 [employer].

## 2022-04-17 NOTE — Progress Notes (Signed)
Subjective:   Cynthia Estrada is a 77 y.o. female who presents for Medicare Annual (Subsequent) preventive examination.  Review of Systems      Cardiac Risk Factors include: advanced age (>85mn, >>58women);hypertension     Objective:    Today's Vitals   04/17/22 1312  BP: 118/62  Pulse: 72  Temp: 97.9 F (36.6 C)  TempSrc: Oral  SpO2: 96%  Weight: 249 lb 4.8 oz (113.1 kg)  Height: 5' 4"$  (1.626 m)   Body mass index is 42.79 kg/m.     04/17/2022    1:22 PM 04/05/2021   10:50 AM 04/27/2020    8:34 AM 09/07/2013   11:06 AM  Advanced Directives  Does Patient Have a Medical Advance Directive? No No No Patient does not have advance directive  Would patient like information on creating a medical advance directive? No - Patient declined No - Patient declined Yes (MAU/Ambulatory/Procedural Areas - Information given)   Pre-existing out of facility DNR order (yellow form or pink MOST form)    No    Current Medications (verified) Outpatient Encounter Medications as of 04/17/2022  Medication Sig   Ascorbic Acid (VITAMIN C) 500 MG tablet Take 500 mg by mouth daily.   cholecalciferol (VITAMIN D3) 25 MCG (1000 UNIT) tablet Take 1,000 Units by mouth daily.   cyanocobalamin 100 MCG tablet Take 100 mcg by mouth as needed.   ezetimibe (ZETIA) 10 MG tablet Take 1 tablet by mouth once daily   furosemide (LASIX) 40 MG tablet Take 1 tablet (40 mg) by mouth twice a day for 3 days, then decrease to taking 1 tablet (40 mg) by mouth once a day   losartan (COZAAR) 50 MG tablet TAKE 1 & 1/2 (ONE & ONE-HALF) TABLETS BY MOUTH ONCE DAILY   pantoprazole (PROTONIX) 40 MG tablet TAKE 1 TABLET BY MOUTH ONCE DAILY *REPLACES  DEXILANT*   rosuvastatin (CRESTOR) 5 MG tablet Take 1 by mouth 2 x per week  can increase to 3 x per week as tolerated   ULORIC 40 MG tablet Take 40 mg by mouth daily.    Facility-Administered Encounter Medications as of 04/17/2022  Medication   technetium tetrofosmin (TC-MYOVIEW)  injection [id number]    Allergies (verified) Simvastatin, Atorvastatin, Lisinopril, and Pravastatin   History: Past Medical History:  Diagnosis Date   ANXIETY 11/11/2006   Arthritis    BACK PAIN, LUMBAR 07/11/2008   Cataract    right eye removed    Chest pain 06/12/2010   Ok stress echo has Gi sx also cw gerd.    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   Retinal detachment    SYNCOPE, HX OF 02/10/2007   UNSPECIFIED ARTHROPATHY SITE UNSPECIFIED 01/12/2007   Unspecified vitamin D deficiency 02/10/2007   Past Surgical History:  Procedure Laterality Date   ABDOMINAL HYSTERECTOMY     age 89for fibroids   CATARACT EXTRACTION Right    done with retinal detachment repair    COLON SURGERY  08/2009   COLONOSCOPY  07/01/2009   Two tubulovillous adenoms 7 and 4 cm size   COLONOSCOPY W/ POLYPECTOMY  07/11/2010   two 7-8 mm  polyps removed - adenomas   hemicollectomy  08/2009   Right for tubulovillous adenomas (2)   RETINAL DETACHMENT SURGERY     Family History  Problem Relation Age of Onset   Hypertension Mother    Diabetes Mother    Hypertension Sister    Hypertension Brother    Hyperlipidemia Brother    Colon cancer Neg Hx    Pancreatic cancer Neg Hx    Rectal cancer Neg Hx    Stomach cancer Neg Hx    Colon polyps Neg Hx    Breast cancer Neg Hx    Esophageal cancer Neg Hx    Social History   Socioeconomic History   Marital status: Single    Spouse name: Not on file   Number of children: Not on file   Years of education: Not on file   Highest education level: Not on file  Occupational History   Occupation: Retire    Comment: [employer]  Tobacco  Use   Smoking status: Never   Smokeless tobacco: Never  Vaping Use   Vaping Use: Never used  Substance and Sexual Activity   Alcohol use: No   Drug use: No   Sexual activity: Not on file  Other Topics Concern   Not on file  Social History Narrative   HH of 1    No pets   Retired from [employer] walking  onlylimited by knee oa changes    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    Social Determinants of Health   Financial Resource Strain: Low Risk  (04/17/2022)   Overall Financial Resource Strain (CARDIA)    Difficulty of Paying Living Expenses: Not hard at all  Food Insecurity: No Food Insecurity (04/17/2022)   Hunger Vital Sign    Worried About Running Out of Food in the Last Year: Never true    Ran Out of Food in the Last Year: Never true  Transportation Needs: No Transportation Needs (04/17/2022)   PRAPARE - Hydrologist (Medical): No    Lack of Transportation (Non-Medical): No  Physical Activity: Insufficiently Active (04/17/2022)   Exercise Vital Sign    Days of Exercise per Week: 2 days    Minutes of Exercise per Session: 30 min  Stress: No Stress Concern Present (04/17/2022)   [place]    Feeling of Stress : Not at all  Social Connections: Moderately Integrated (04/17/2022)   Social Connection and Isolation Panel [NHANES]    Frequency of Communication with Friends and Family: More than three times a week    Frequency of Social Gatherings with Friends and Family: More than three times a week    Attends Religious Services: More than 4 times per year    Active Member of [employer] or Organizations: Yes    Attends Music therapist: More than 4 times per year    Marital Status: Never married    Tobacco Counseling Counseling given: Not Answered   Clinical Intake:  Pre-visit preparation completed:  No  Pain : No/denies pain     BMI - recorded: 42.79 Nutritional Status: BMI > 30  Obese Nutritional Risks: None Diabetes: No  How often do you need to have someone help you when you read instructions, pamphlets, or other written materials from your doctor or pharmacy?: 1 - Never  Diabetic? No  Interpreter Needed?: No  Information entered by :: Rolene Arbour LPN   Activities of Daily Living    04/17/2022    1:20 PM  In your present state of health, do you have any difficulty performing the following activities:  Hearing? 0  Vision? 0  Difficulty concentrating or making decisions? 0  Walking or climbing stairs? 0  Dressing or bathing? 0  Doing errands, shopping? 0  Preparing Food and eating ? N  Using the Toilet? N  In the past six months, have you accidently leaked urine? N  Do you have problems with loss of bowel control? N  Managing your Medications? N  Managing your Finances? N  Housekeeping or managing your Housekeeping? N    Patient Care Team: Panosh, Standley Brooking, MD as PCP - General Jettie Booze, MD as PCP - Cardiology (Cardiology) Gatha Mayer, MD (Gastroenterology) Addison Lank, MD as Referring Physician (Dermatology) Josue Hector, MD (Cardiology) Valinda Party, MD (Rheumatology) Jettie Booze, MD as Consulting Physician (Cardiology)  Indicate any recent Medical Services you may have received from other than [facility] providers in the past year (date may be approximate).     Assessment:   This is a routine wellness examination for [place].  Hearing/Vision screen Hearing Screening - Comments:: Denies hearing difficulties   Vision Screening - Comments:: Wears rx glasses - up to date with routine eye exams with  [place] issues and exercise activities discussed: Exercise limited by: None identified   Goals Addressed               This Visit's Progress     No current goals (pt-stated)         Depression Screen     04/17/2022    1:19 PM 01/16/2022   11:17 AM 10/02/2021    1:36 PM 07/02/2021    9:25 AM 06/11/2021    2:48 PM 04/05/2021   10:46 AM 04/27/2020    8:48 AM  PHQ 2/9 Scores  PHQ - 2 Score 0 0 0 0 0 0 0  PHQ- 9 Score  3 0 0 0      Fall Risk    04/17/2022    1:21 PM 01/16/2022   11:17 AM 10/02/2021    1:37 PM 07/02/2021    9:24 AM 06/11/2021    2:48 PM  [place] in the past year? 0 0 0 0 0  Number falls in past yr: 0 0 0 0 0  Injury with Fall? 0 0 0 0 0  Risk for fall due to : No Fall Risks No Fall Risks No Fall Risks No Fall Risks No Fall Risks  Follow up Falls prevention discussed Falls evaluation completed Falls evaluation completed Falls evaluation completed Falls evaluation completed    FALL RISK PREVENTION PERTAINING TO THE HOME:  Any stairs in or around the home? No  If so, are there any without handrails? No  Home free of loose throw rugs in walkways, pet beds, electrical cords, etc? Yes  Adequate lighting in your home to reduce risk of falls? Yes   ASSISTIVE DEVICES UTILIZED TO PREVENT FALLS:  Life alert? No  Use of a cane, walker or w/c? Yes  Grab bars in the bathroom? Yes  Shower chair or bench in shower? No  Elevated toilet seat or a handicapped toilet? Yes   TIMED UP AND GO:  Was the test performed? Yes .  Length of time to ambulate 10 feet: 10 sec.  Gait slow and steady with assistive device  Cognitive Function:        04/17/2022    1:22 PM 04/05/2021   10:51 AM  6CIT Screen  What Year? 0 points 0 points  What month? 0 points 0 points  What time? 0 points 0 points  Count back from 20 0 points 0 points  Months in reverse 2 points 0 points  Repeat phrase 2 points 0 points  Total Score 4 points 0 points    Immunizations Immunization History  Administered Date(s) Administered   Fluad Quad(high Dose 65+) 12/02/2019   Influenza Split 01/02/2012   Influenza Whole 01/12/2007, 12/02/2007, 01/12/2009   Influenza, High Dose Seasonal PF 11/01/2014,  12/25/2015, 12/19/2016, 01/05/2018, 12/11/2021   Influenza, Quadrivalent, Recombinant, Inj, Pf 12/01/2018, 12/08/2019   Influenza,inj,Quad PF,6+ Mos 10/27/2012, 12/02/2013   PFIZER(Purple Top)SARS-COV-2 Vaccination 03/09/2019, 03/30/2019   Pneumococcal Conjugate-13 10/29/2013   Pneumococcal Polysaccharide-23 10/16/2009   Td 10/16/2009   Zoster, Live 10/22/2011    TDAP status: Due, Education has been provided regarding the importance of this vaccine. Advised may receive this vaccine at local pharmacy or Health Dept. Aware to provide a copy of the vaccination record if obtained from local pharmacy or Health Dept. Verbalized acceptance and understanding.  Flu Vaccine status: Up to date  Pneumococcal vaccine status: Up to date  Covid-19 vaccine status: Completed vaccines  Qualifies for Shingles Vaccine? Yes   Zostavax completed No   Shingrix Completed?: No.    Education has been provided regarding the importance of this vaccine. Patient has been advised to call insurance company to determine out of pocket expense if they have not yet received this vaccine. Advised may also receive vaccine at local pharmacy or Health Dept. Verbalized acceptance and understanding.  Screening Tests Health Maintenance  Topic Date Due   DTaP/Tdap/Td (2 - Tdap) 10/17/2019   COVID-19 Vaccine (3 - Pfizer risk series) 05/03/2022 (Originally 04/27/2019)   Zoster Vaccines- Shingrix (1 of 2) 07/16/2022 (Originally 03/11/1964)   MAMMOGRAM  01/23/2023   Medicare Annual Wellness (AWV)  04/18/2023   Pneumonia Vaccine 21+ Years old  Completed   INFLUENZA VACCINE  Completed   DEXA SCAN  Completed   Hepatitis C Screening  Completed   HPV VACCINES  Aged Out   COLONOSCOPY (Pts 45-60yr Insurance coverage will need to be confirmed)  Discontinued    Health Maintenance  Health Maintenance Due  Topic Date Due   DTaP/Tdap/Td (2 - Tdap) 10/17/2019    Colorectal cancer screening: No longer required.   Mammogram status:  Completed 01/22/22. Repeat every year  Bone Density status: Completed 09/08/12. Results reflect: Bone density results: OSTEOPOROSIS. Repeat every   years.  Lung Cancer Screening: (Low Dose CT Chest recommended if Age 78-80years, 30 pack-year currently smoking OR have quit w/in 15years.) does not qualify.     Additional Screening:  Hepatitis C Screening: does qualify; Completed 07/04/17  Vision Screening: Recommended annual ophthalmology exams for early detection of glaucoma and other disorders of the eye. Is the patient up to date with their annual eye exam?  Yes  Who is the provider or what is the name of the office in which the patient attends annual eye exams? [email or web address] is not established with a provider, would they like to be referred to a provider to establish care? No .   Dental Screening: Recommended annual dental exams for proper oral hygiene  Community Resource Referral / Chronic Care Management:  CRR required this visit?  No   CCM required this visit?  No      Plan:     I have personally reviewed and noted the following in the patient's chart:   Medical and social history Use of alcohol, tobacco or illicit drugs  Current medications and supplements including opioid prescriptions. Patient is not currently taking opioid prescriptions. Functional ability and status Nutritional status Physical activity Advanced directives List of other physicians Hospitalizations, surgeries, and ER visits in previous 12 months Vitals Screenings to include cognitive, depression, and falls Referrals and appointments  In addition, I have reviewed and discussed with patient certain preventive protocols, quality metrics, and best practice recommendations. A written personalized care plan for preventive services as well as general preventive health recommendations were provided to patient.     Criselda Peaches, LPN   [id number]   Nurse Notes: None

## 2022-05-08 ENCOUNTER — Ambulatory Visit (INDEPENDENT_AMBULATORY_CARE_PROVIDER_SITE_OTHER): Payer: Medicare HMO | Admitting: Internal Medicine

## 2022-05-08 VITALS — BP 138/76 | HR 77 | Temp 98.2°F | Ht 64.0 in | Wt 242.6 lb

## 2022-05-08 DIAGNOSIS — K219 Gastro-esophageal reflux disease without esophagitis: Secondary | ICD-10-CM | POA: Diagnosis not present

## 2022-05-08 DIAGNOSIS — M79672 Pain in left foot: Secondary | ICD-10-CM | POA: Diagnosis not present

## 2022-05-08 DIAGNOSIS — E785 Hyperlipidemia, unspecified: Secondary | ICD-10-CM

## 2022-05-08 DIAGNOSIS — Z79899 Other long term (current) drug therapy: Secondary | ICD-10-CM

## 2022-05-08 DIAGNOSIS — M2550 Pain in unspecified joint: Secondary | ICD-10-CM

## 2022-05-08 DIAGNOSIS — R739 Hyperglycemia, unspecified: Secondary | ICD-10-CM | POA: Diagnosis not present

## 2022-05-08 DIAGNOSIS — M79671 Pain in right foot: Secondary | ICD-10-CM | POA: Diagnosis not present

## 2022-05-08 DIAGNOSIS — M791 Myalgia, unspecified site: Secondary | ICD-10-CM | POA: Diagnosis not present

## 2022-05-08 DIAGNOSIS — I10 Essential (primary) hypertension: Secondary | ICD-10-CM

## 2022-05-08 DIAGNOSIS — Z86010 Personal history of colonic polyps: Secondary | ICD-10-CM

## 2022-05-08 LAB — BASIC METABOLIC PANEL
BUN: 10 mg/dL (ref 6–23)
CO2: 29 mEq/L (ref 19–32)
Calcium: 10.6 mg/dL — ABNORMAL HIGH (ref 8.4–10.5)
Chloride: 99 mEq/L (ref 96–112)
Creatinine, Ser: 1.3 mg/dL — ABNORMAL HIGH (ref 0.40–1.20)
GFR: 39.48 mL/min — ABNORMAL LOW (ref >60.00)
Glucose, Bld: 96 mg/dL (ref 70–99)
Potassium: 4 mEq/L (ref 3.5–5.1)
Sodium: 136 mEq/L (ref 135–145)

## 2022-05-08 LAB — CBC WITH DIFFERENTIAL/PLATELET
Basophils Absolute: 0 10*3/uL (ref 0.0–0.1)
Basophils Relative: 0.6 % (ref 0.0–3.0)
Eosinophils Absolute: 0 10*3/uL (ref 0.0–0.7)
Eosinophils Relative: 0.8 % (ref 0.0–5.0)
HCT: 43.9 % (ref 36.0–46.0)
Hemoglobin: 14.5 g/dL (ref 12.0–15.0)
Lymphocytes Relative: 33.5 % (ref 12.0–46.0)
Lymphs Abs: 1.9 10*3/uL (ref 0.7–4.0)
MCHC: 33 g/dL (ref 30.0–36.0)
MCV: 82.3 fl (ref 78.0–100.0)
Monocytes Absolute: 0.7 10*3/uL (ref 0.1–1.0)
Monocytes Relative: 12.9 % — ABNORMAL HIGH (ref 3.0–12.0)
Neutro Abs: 2.9 10*3/uL (ref 1.4–7.7)
Neutrophils Relative %: 52.2 % (ref 43.0–77.0)
Platelets: 225 10*3/uL (ref 150.0–400.0)
RBC: 5.33 Mil/uL — ABNORMAL HIGH (ref 3.87–5.11)
RDW: 15 % (ref 11.5–15.5)
WBC: 5.6 10*3/uL (ref 4.0–10.5)

## 2022-05-08 LAB — LIPID PANEL
Cholesterol: 269 mg/dL — ABNORMAL HIGH (ref 0–200)
HDL: 75.2 mg/dL (ref >39.00)
LDL Cholesterol: 159 mg/dL — ABNORMAL HIGH (ref 0–99)
NonHDL: 193.85
Total CHOL/HDL Ratio: 4
Triglycerides: 175 mg/dL — ABNORMAL HIGH (ref 0.0–149.0)
VLDL: 35 mg/dL (ref 0.0–40.0)

## 2022-05-08 LAB — HEPATIC FUNCTION PANEL
ALT: 22 U/L (ref 0–35)
AST: 23 U/L (ref 0–37)
Albumin: 4.7 g/dL (ref 3.5–5.2)
Alkaline Phosphatase: 78 U/L (ref 39–117)
Bilirubin, Direct: 0.1 mg/dL (ref 0.0–0.3)
Total Bilirubin: 0.9 mg/dL (ref 0.2–1.2)
Total Protein: 7.3 g/dL (ref 6.0–8.3)

## 2022-05-08 LAB — MAGNESIUM: Magnesium: 1.7 mg/dL (ref 1.5–2.5)

## 2022-05-08 MED ORDER — PANTOPRAZOLE SODIUM 40 MG PO TBEC: DELAYED_RELEASE_TABLET | ORAL | 2 refills | Status: DC

## 2022-05-08 NOTE — Patient Instructions (Signed)
Checking lab to see if could be related to foot pain  If all ok  then ask  rheumatology about  symptoms also.   Refilled the protonix today .

## 2022-05-08 NOTE — Progress Notes (Signed)
Chief Complaint  Patient presents with   Medical Management of Chronic Issues    HPI: Cynthia Estrada 77 y.o. come in for Chronic disease management  fu ov Battles arthritis and  edema lst given  trial of inc furosemide  with some help Saw Cardiology November  edema flet to moslty be from venous insufficiency and no acute dv renal cause : Arthritis : sees Rheum Dr Dossie Der  Colon polyps   BP losartan  Hld crestor and zetia  Gi as needed ooi instead of dexilant  At night  bothers at night  toes  have sharp pain not numb or temp change  not sure why  no pain with day waking and no claudication.  Ran out  of med   and moved recently .  To one story   ASSESSMENT AND PLAN:   LE edema: I think this is likely from venous .  Insufficiency elevating legs and compression stockings should help.  Can try 10 to 20 mmHg up to the knee stockings.  Increase furosemide to 40 mg twice a day for 3 days.  Check electrolytes at the end of the week. Fatigue/DOE:  Looks volume overloaded. Needs more activity in general.  Check BNP as well.   I suspect some of her dyspnea on exertion is due to deconditioning.  She has several cardiac tests done over the past few years which have all been normal.  No clear anginal symptoms.  Will reassess when she is more euvolemic. Morbid obesity: Eating High-fiber foods. avoiding processed foods.  She lives alone and eats often at a senior center.  HTN:  Some high readings at home.  Try for more diuresis.      Current medicines are reviewed at length with the patient today.  The patient concerns regarding her medicines were addressed.   The following changes have been made:  Increase Lasix to 40 mg BID.   Labs/ tests ordered today include: BMet on Friday.  No orders of the defined types were placed in this encounter.     Recommend 150 minutes/week of aerobic exercise Low fat, low carb, high fiber diet recommended   Disposition:   FU in 4-6 weeks   ROS: See pertinent  positives and negatives per HPI.  Past Medical History:  Diagnosis Date   ANXIETY 11/11/2006   Arthritis    BACK PAIN, LUMBAR 07/11/2008   Cataract    right eye removed    Chest pain 06/12/2010   Ok stress echo has Gi sx also cw gerd.    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   Retinal detachment    SYNCOPE, HX OF 02/10/2007   UNSPECIFIED ARTHROPATHY SITE UNSPECIFIED 01/12/2007   Unspecified vitamin D deficiency 02/10/2007    Family History  Problem Relation Age of Onset   Hypertension Mother    Diabetes Mother    Hypertension Sister    Hypertension Brother    Hyperlipidemia Brother    Colon cancer Neg Hx    Pancreatic cancer Neg Hx    Rectal cancer Neg Hx    Stomach cancer Neg Hx    Colon polyps Neg Hx  Breast cancer Neg Hx    Esophageal cancer Neg Hx     Social History   Socioeconomic History   Marital status: Single    Spouse name: Not on file   Number of children: Not on file   Years of education: Not on file   Highest education level: Not on file  Occupational History   Occupation: Retire    Comment: [employer]  Tobacco Use   Smoking status: Never   Smokeless tobacco: Never  Vaping Use   Vaping Use: Never used  Substance and Sexual Activity   Alcohol use: No   Drug use: No   Sexual activity: Not on file  Other Topics Concern   Not on file  Social History Narrative   HH of 1    No pets   Retired from [employer] walking  onlylimited by knee oa changes    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    Social Determinants of Health   Financial Resource Strain: Low Risk   (04/17/2022)   Overall Financial Resource Strain (CARDIA)    Difficulty of Paying Living Expenses: Not hard at all  Food Insecurity: No Food Insecurity (04/17/2022)   Hunger Vital Sign    Worried About Running Out of Food in the Last Year: Never true    Ran Out of Food in the Last Year: Never true  Transportation Needs: No Transportation Needs (04/17/2022)   PRAPARE - Hydrologist (Medical): No    Lack of Transportation (Non-Medical): No  Physical Activity: Insufficiently Active (04/17/2022)   Exercise Vital Sign    Days of Exercise per Week: 2 days    Minutes of Exercise per Session: 30 min  Stress: No Stress Concern Present (04/17/2022)   [place]    Feeling of Stress : Not at all  Social Connections: Moderately Integrated (04/17/2022)   Social Connection and Isolation Panel [NHANES]    Frequency of Communication with Friends and Family: More than three times a week    Frequency of Social Gatherings with Friends and Family: More than three times a week    Attends Religious Services: More than 4 times per year    Active Member of [employer] or Organizations: Yes    Attends Music therapist: More than 4 times per year    Marital Status: Never married    Outpatient Medications Prior to Visit  Medication Sig Dispense Refill   Ascorbic Acid (VITAMIN C) 500 MG tablet Take 500 mg by mouth daily.     cholecalciferol (VITAMIN D3) 25 MCG (1000 UNIT) tablet Take 1,000 Units by mouth daily.     cyanocobalamin 100 MCG tablet Take 100 mcg by mouth as needed.     ezetimibe (ZETIA) 10 MG tablet Take 1 tablet by mouth once daily 90 tablet 0   furosemide (LASIX) 40 MG tablet Take 1 tablet (40 mg) by mouth twice a day for 3 days, then decrease to taking 1 tablet (40 mg) by mouth once a day 93 tablet 3   losartan (COZAAR) 50 MG tablet TAKE 1 & 1/2 (ONE & ONE-HALF) TABLETS BY MOUTH ONCE DAILY 135 tablet  0   ULORIC 40 MG tablet Take 40 mg by mouth daily.      pantoprazole (PROTONIX) 40 MG tablet TAKE 1 TABLET BY MOUTH ONCE DAILY *REPLACES  DEXILANT* 30 tablet  2   rosuvastatin (CRESTOR) 5 MG tablet Take 1 by mouth 2 x per week  can increase to 3 x per week as tolerated (Patient not taking: Reported on 05/08/2022) 45 tablet 3   Facility-Administered Medications Prior to Visit  Medication Dose Route Frequency Provider Last Rate Last Admin   technetium tetrofosmin (TC-MYOVIEW) injection 10.1 millicurie  10.1 millicurie Intravenous Once PRN Pricilla Riffle, MD         EXAM:  BP 138/76 (BP Location: Left Arm, Cuff Size: Large)   Pulse 77   Temp 98.2 F (36.8 C) (Oral)   Ht 5\' 4"  (1.626 m)   Wt 242 lb 9.6 oz (110 kg)   SpO2 99%   BMI 41.64 kg/m   Body mass index is 41.64 kg/m.  GENERAL: vitals reviewed and listed above, alert, oriented, appears well hydrated and in no acute distress HEENT: atraumatic, conjunctiva  clear, no obvious abnormalities on inspection of external nose and ears  NECK: no obvious masses on inspection palpation  LUNGS: clear to auscultation bilaterally, no wheezes, rales or rhonchi, good air movement CV: HRRR, no clubbing cyanosis nl cap refill  MS: moves all extremities without noticeable focal  abnormality walks independent  with cane  fet  no lesion and sense to 10 microfilament nl PSYCH: pleasant and cooperative, no obvious depression or anxiety Lab Results  Component Value Date   WBC 5.6 05/08/2022   HGB 14.5 05/08/2022   HCT 43.9 05/08/2022   PLT 225.0 05/08/2022   GLUCOSE 96 05/08/2022   CHOL 269 (H) 05/08/2022   TRIG 175.0 (H) 05/08/2022   HDL 75.20 05/08/2022   LDLDIRECT 171.0 12/20/2015   LDLCALC 159 (H) 05/08/2022   ALT 22 05/08/2022   AST 23 05/08/2022   NA 136 05/08/2022   K 4.0 05/08/2022   CL 99 05/08/2022   CREATININE 1.30 (H) 05/08/2022   BUN 10 05/08/2022   CO2 29 05/08/2022   TSH 1.61 07/04/2017   HGBA1C 5.8 (A) 01/16/2022   BP  Readings from Last 3 Encounters:  05/08/22 138/76  04/17/22 118/62  03/19/22 132/73    ASSESSMENT AND PLAN:  Discussed the following assessment and plan:  Medication management - Plan: Basic metabolic panel, Magnesium, Lipid panel, CBC with Differential/Platelet, Hepatic function panel  Essential hypertension - Plan: Basic metabolic panel, Magnesium, Lipid panel, CBC with Differential/Platelet, Hepatic function panel  Multiple joint pain - Plan: Basic metabolic panel, Magnesium, Lipid panel, CBC with Differential/Platelet, Hepatic function panel  Hyperglycemia - Plan: Basic metabolic panel, Magnesium, Lipid panel, CBC with Differential/Platelet, Hepatic function panel  Personal history of tubulovillous adenomas of the colon  Myalgia - Plan: Basic metabolic panel, Magnesium, Lipid panel, CBC with Differential/Platelet, Hepatic function panel  Hyperlipidemia, unspecified hyperlipidemia type - Plan: Basic metabolic panel, Magnesium, Lipid panel, CBC with Differential/Platelet, Hepatic function panel  Gastroesophageal reflux disease, unspecified whether esophagitis present - Plan: Basic metabolic panel, Magnesium, Lipid panel, CBC with Differential/Platelet, Hepatic function panel  Foot pain, bilateral - Plan: Basic metabolic panel, Magnesium, Lipid panel, CBC with Differential/Platelet, Hepatic function panel No explanation for toe pain at night   at this time Ask  rheum opinion also  Update lab work today  -Patient advised to return or notify health care team  if  new concerns arise.  Patient Instructions  Checking lab to see if could be related to foot pain  If all ok  then ask  rheumatology about  symptoms also.   Refilled the protonix today .  Standley Brooking. Nijee Heatwole M.D.

## 2022-05-13 ENCOUNTER — Encounter: Payer: Self-pay | Admitting: Internal Medicine

## 2022-05-13 NOTE — Progress Notes (Signed)
Cholesterol up from last year   ;  suspectfrom begin off of crestor  that made  cholesterol go down at last check .Marland Kitchen Can you try taking once a week?  Will ask Angela herring pharmacist to contact you about ideas for medication  to avoid side effects .

## 2022-05-14 NOTE — Progress Notes (Signed)
Contacted patient and had to leave a voicemail. Can a [facility]Pharm[facility] referral be placed for patient so I can get her on my schedule and help manage her statin use continuously?

## 2022-05-16 NOTE — Progress Notes (Signed)
Tried to contact patient and had to leave a voicemail! Can a referral for [facility]Pharmacy be placed for patient so she can be placed on my schedule for medication management?

## 2022-05-22 ENCOUNTER — Other Ambulatory Visit: Payer: Self-pay

## 2022-05-22 DIAGNOSIS — E785 Hyperlipidemia, unspecified: Secondary | ICD-10-CM

## 2022-06-11 DIAGNOSIS — R768 Other specified abnormal immunological findings in serum: Secondary | ICD-10-CM | POA: Diagnosis not present

## 2022-06-11 DIAGNOSIS — Z79899 Other long term (current) drug therapy: Secondary | ICD-10-CM | POA: Diagnosis not present

## 2022-06-11 DIAGNOSIS — M109 Gout, unspecified: Secondary | ICD-10-CM | POA: Diagnosis not present

## 2022-06-11 DIAGNOSIS — M1990 Unspecified osteoarthritis, unspecified site: Secondary | ICD-10-CM | POA: Diagnosis not present

## 2022-06-11 DIAGNOSIS — I10 Essential (primary) hypertension: Secondary | ICD-10-CM | POA: Diagnosis not present

## 2022-06-11 DIAGNOSIS — Z23 Encounter for immunization: Secondary | ICD-10-CM | POA: Diagnosis not present

## 2022-06-26 ENCOUNTER — Telehealth: Payer: Self-pay | Admitting: Internal Medicine

## 2022-06-26 MED ORDER — EZETIMIBE 10 MG PO TABS: 10.0000 mg | ORAL_TABLET | Freq: Every day | ORAL | 1 refills | Status: DC

## 2022-06-26 MED ORDER — LOSARTAN POTASSIUM 50 MG PO TABS: ORAL_TABLET | ORAL | 0 refills | Status: DC

## 2022-06-26 NOTE — Addendum Note (Signed)
Addended byVickii Chafe on: 06/26/2022 03:58 PM   Modules accepted: Orders

## 2022-06-26 NOTE — Telephone Encounter (Signed)
Prescription Request  06/26/2022  LOV: 05/08/2022  What is the name of the medication or equipment? ezetimibe (ZETIA) 10 MG tablet losartan (COZAAR) 50 MG tablet  Have you contacted your pharmacy to request a refill? No   Which pharmacy would you like this sent to?  [facility] - [place] ([place]), [place] - [address] [place] ([place]) [place] [address] Phone: [phone] Fax: [phone]    Patient notified that their request is being sent to the clinical staff for review and that they should receive a response within 2 business days.   Please advise at Mobile [phone] (mobile)

## 2022-06-26 NOTE — Telephone Encounter (Signed)
Attempted to reach. Left a voicemail for them to contact us back.

## 2022-06-26 NOTE — Addendum Note (Signed)
Addended byVickii Chafe on: 06/26/2022 03:55 PM   Modules accepted: Orders

## 2022-06-27 NOTE — Telephone Encounter (Signed)
Spoke to pt. Pt is aware the medication is sent.

## 2022-06-28 DIAGNOSIS — M1030 Gout due to renal impairment, unspecified site: Secondary | ICD-10-CM | POA: Diagnosis not present

## 2022-06-28 DIAGNOSIS — K219 Gastro-esophageal reflux disease without esophagitis: Secondary | ICD-10-CM | POA: Diagnosis not present

## 2022-06-28 DIAGNOSIS — M1990 Unspecified osteoarthritis, unspecified site: Secondary | ICD-10-CM | POA: Diagnosis not present

## 2022-06-28 DIAGNOSIS — Z8249 Family history of ischemic heart disease and other diseases of the circulatory system: Secondary | ICD-10-CM | POA: Diagnosis not present

## 2022-06-28 DIAGNOSIS — I129 Hypertensive chronic kidney disease with stage 1 through stage 4 chronic kidney disease, or unspecified chronic kidney disease: Secondary | ICD-10-CM | POA: Diagnosis not present

## 2022-06-28 DIAGNOSIS — E785 Hyperlipidemia, unspecified: Secondary | ICD-10-CM | POA: Diagnosis not present

## 2022-06-28 DIAGNOSIS — Z888 Allergy status to other drugs, medicaments and biological substances status: Secondary | ICD-10-CM | POA: Diagnosis not present

## 2022-06-28 DIAGNOSIS — R269 Unspecified abnormalities of gait and mobility: Secondary | ICD-10-CM | POA: Diagnosis not present

## 2022-06-28 DIAGNOSIS — Z6837 Body mass index (BMI) 37.0-37.9, adult: Secondary | ICD-10-CM | POA: Diagnosis not present

## 2022-06-28 DIAGNOSIS — E790 Hyperuricemia without signs of inflammatory arthritis and tophaceous disease: Secondary | ICD-10-CM | POA: Diagnosis not present

## 2022-06-28 DIAGNOSIS — E012 Iodine-deficiency related (endemic) goiter, unspecified: Secondary | ICD-10-CM | POA: Diagnosis not present

## 2022-06-28 DIAGNOSIS — N1832 Chronic kidney disease, stage 3b: Secondary | ICD-10-CM | POA: Diagnosis not present

## 2022-07-04 ENCOUNTER — Telehealth: Payer: Self-pay

## 2022-07-04 NOTE — Progress Notes (Signed)
Patient ID: Cynthia Estrada, female   DOB: [birth year], 77 y.o.   MRN: [id number]  Care Management & Coordination Services Pharmacy Team  Reason for Encounter: Chart Prep for initial encounter with Johny Drilling D on 07/10/22 at 1 pm in office.    Unsuccessful outreach. Left voicemail for patient to return call.   Chart review:  Recent office visits:  05/08/22 Panosh, Neta Mends, MD - Patient presented for medication management and other concerns. No medication changes.  04/17/22 Tillie Rung, LPN - Patient presented for Medicare annual wellness exam. No medication changes.   01/16/22 Panosh, Neta Mends, MD - Patient presented for preventative health examination and other concerns. Stopped prednisone.  Recent consult visits:  03/19/22 Iva Boop - Patient presented to [facility] for Colonoscopy.  03/15/22 Estevan Oaks - Claims encounter for Localized edema and other concerns. No other visit details available.   02/06/22 Doree Albee PA-C - Patient presented for History of colonic polyp and other concerns. Prescribed NA Sulfate-K Sulfate-Mg  01/22/22 Corky Crafts, MD (Cardiology) - Patient presented for Edema of both lower legs and other concerns. Increased Furosemide.    Hospital visits:  None in previous 6 months   Fill History : EZETIMIBE 10MG  TAB 06/27/2022 90   FEBUXOSTAT 40MG  TAB 06/03/2022 30   FUROSEMIDE 40MG      TAB 01/22/2022 47   HYDROCHLOROTHIAZIDE 12.5MG  CAP 05/29/2021 90   LOSARTAN 50MG  TAB 06/27/2022 90   PANTOPRAZOLE SOD 40MG  TAB 06/07/2022 30   ROSUVASTATIN CAL 5MG  TAB 07/23/2021 84    Star Rating Drugs:  Losartan 50 mg - Last filled 06/27/22 90 DS at [facility] Rosuvastatin 5 mg - Last filled 07/23/21 84 DS at [facility]    Care Gaps: COVID Booster - Overdue TDAP - Overdue Zoster Vaccine - Postponed AWV - 04/17/22    Pamala Duffel CMA Clinical Pharmacist Assistant [phone]

## 2022-07-08 NOTE — Progress Notes (Signed)
Care Management & Coordination Services Pharmacy Note  07/10/2022 Name:  Cynthia Estrada MRN:  [id number] DOB:  1945-09-14  Summary: BP at goal <140/90 LDL not at goal <100, not taking statin due to myalgia Due for updated DEXA  Recommendations/Changes made from today's visit: -Counseled to check BP once weekly or if symptomatic for low or high BP -Counseled on cholesterol-lowering diet and exercise -START CoQ10 daily OTC to aid with statin induced myalgias -RESTART Rosuvastatin 5mg  twice weekly -ORDER updated DEXA scan with PCP approval  Follow up plan: 2 weeks to assess rosuvastatin tolerance BP/HLD call in 3 months Pharmacist visit in Dec   Subjective: Cynthia Estrada is an 77 y.o. year old female who is a primary patient of Panosh, Neta Mends, MD.  The care coordination team was consulted for assistance with disease management and care coordination needs.    Engaged with patient face to face for initial visit.  Recent office visits: 05/08/22 Panosh, Neta Mends, MD - Patient presented for medication management and other concerns. No medication changes.   04/17/22 Tillie Rung, LPN - Patient presented for Medicare annual wellness exam. No medication changes.    01/16/22 Panosh, Neta Mends, MD - Patient presented for preventative health examination and other concerns. Stopped prednisone  Recent consult visits: 03/19/22 Iva Boop - Patient presented to [facility] for Colonoscopy.   03/15/22 Estevan Oaks - Claims encounter for Localized edema and other concerns. No other visit details available.    02/06/22 Doree Albee PA-C - Patient presented for History of colonic polyp and other concerns. Prescribed NA Sulfate-K Sulfate-Mg   01/22/22 Corky Crafts, MD (Cardiology) - Patient presented for Edema of both lower legs and other concerns. Increased Furosemide.   Hospital visits: None in previous 6 months   Objective:  Lab Results  Component Value  Date   CREATININE 1.30 (H) 05/08/2022   BUN 10 05/08/2022   GFR 39.48 (L) 05/08/2022   EGFR 40 (L) 01/25/2022   GFRNONAA 51.04 01/22/2021   GFRAA 42.19 01/22/2021   NA 136 05/08/2022   K 4.0 05/08/2022   CALCIUM 10.6 (H) 05/08/2022   CO2 29 05/08/2022   GLUCOSE 96 05/08/2022    Lab Results  Component Value Date/Time   HGBA1C 5.8 (A) 01/16/2022 11:47 AM   HGBA1C 6.3 01/04/2021 08:08 AM   HGBA1C 5.9 (H) 02/02/2020 09:52 AM   GFR 39.48 (L) 05/08/2022 12:33 PM   GFR 36.27 (L) 10/02/2021 01:57 PM    Last diabetic Eye exam: No results found for: "HMDIABEYEEXA"  Last diabetic Foot exam: No results found for: "HMDIABFOOTEX"   Lab Results  Component Value Date   CHOL 269 (H) 05/08/2022   HDL 75.20 05/08/2022   LDLCALC 159 (H) 05/08/2022   LDLDIRECT 171.0 12/20/2015   TRIG 175.0 (H) 05/08/2022   CHOLHDL 4 05/08/2022       Latest Ref Rng & Units 05/08/2022   12:33 PM 05/04/2021   12:00 AM 04/10/2021    9:40 AM  Hepatic Function  Total Protein 6.0 - 8.3 g/dL 7.3   6.8   Albumin 3.5 - 5.2 g/dL 4.7  4.4     4.4   AST 0 - 37 U/L 23   21   ALT 0 - 35 U/L 22   18   Alk Phosphatase 39 - 117 U/L 78   63   Total Bilirubin 0.2 - 1.2 mg/dL 0.9   0.6   Bilirubin, Direct 0.0 - 0.3 mg/dL 0.1  This result is from an external source.    Lab Results  Component Value Date/Time   TSH 1.61 07/04/2017 09:40 AM   TSH 1.35 12/20/2015 08:26 AM   FREET4 0.77 07/04/2017 09:40 AM   FREET4 0.86 11/01/2014 10:20 AM       Latest Ref Rng & Units 05/08/2022   12:33 PM 05/04/2021   12:00 AM 04/10/2021    9:40 AM  CBC  WBC 4.0 - 10.5 K/uL 5.6  4.3     5.3   Hemoglobin 12.0 - 15.0 g/dL 62.1  30.8     65.7   Hematocrit 36.0 - 46.0 % 43.9  40     39.5   Platelets 150.0 - 400.0 K/uL 225.0  181     194.0      This result is from an external source.    Lab Results  Component Value Date/Time   VD25OH 60.69 04/10/2021 09:40 AM   VD25OH 38 12/23/2011 09:42 AM   VITAMINB12 585 01/15/2006 08:09  AM    Clinical ASCVD: No  The 10-year ASCVD risk score (Arnett DK, et al., 2019) is: 29%   Values used to calculate the score:     Age: 15 years     Sex: Female     Is Non-Hispanic African American: Yes     Diabetic: No     Tobacco smoker: No     Systolic Blood Pressure: 140 mmHg     Is BP treated: Yes     HDL Cholesterol: 75.2 mg/dL     Total Cholesterol: 269 mg/dL    DEXA 8/46/9629 - Normal BMD     05/08/2022   12:02 PM 04/17/2022    1:19 PM 01/16/2022   11:17 AM  Depression screen PHQ 2/9  Decreased Interest 0 0 0  Down, Depressed, Hopeless 0 0 0  PHQ - 2 Score 0 0 0  Altered sleeping 0  0  Tired, decreased energy 0  3  Change in appetite 0  0  Feeling bad or failure about yourself  0  0  Trouble concentrating 0  0  Moving slowly or fidgety/restless 0  0  Suicidal thoughts 0  0  PHQ-9 Score 0  3  Difficult doing work/chores Not difficult at all  Very difficult     Social History   Tobacco Use  Smoking Status Never  Smokeless Tobacco Never   BP Readings from Last 3 Encounters:  05/08/22 138/76  04/17/22 118/62  03/19/22 132/73   Pulse Readings from Last 3 Encounters:  05/08/22 77  04/17/22 72  03/19/22 65   Wt Readings from Last 3 Encounters:  05/08/22 242 lb 9.6 oz (110 kg)  04/17/22 249 lb 4.8 oz (113.1 kg)  03/19/22 259 lb (117.5 kg)   BMI Readings from Last 3 Encounters:  05/08/22 41.64 kg/m  04/17/22 42.79 kg/m  03/19/22 44.46 kg/m    Allergies  Allergen Reactions   Simvastatin Other (See Comments)    Myalgia  Slight elevation cpk 2013   Atorvastatin     myalgia   Lisinopril Other (See Comments)    REACTION: ? cough   Pravastatin Other (See Comments)    Muscle aches     Medications Reviewed Today     Reviewed by Sherrill Raring, RPH (Pharmacist) on 07/10/22 at 1342  Med List Status: <None>   Medication Order Taking? Sig Documenting Provider Last Dose Status Informant  Ascorbic Acid (VITAMIN C) 500 MG tablet [id number] Yes Take  500 mg  by mouth daily. [provider] Taking Active Self  cholecalciferol (VITAMIN D3) 25 MCG (1000 UNIT) tablet [id number] Yes Take 1,000 Units by mouth daily. [provider] Taking Active   cyanocobalamin 100 MCG tablet [id number] Yes Take 100 mcg by mouth as needed. [provider] Taking Active   ezetimibe (ZETIA) 10 MG tablet [id number] Yes Take 1 tablet (10 mg total) by mouth daily. Panosh, Neta Mends, MD Taking Active   furosemide (LASIX) 40 MG tablet [id number] Yes Take 1 tablet (40 mg total) by mouth daily. Corky Crafts, MD Taking Active   losartan (COZAAR) 50 MG tablet [id number] Yes TAKE 1 & 1/2 (ONE & ONE-HALF) TABLETS BY MOUTH ONCE DAILY Panosh, Neta Mends, MD Taking Active   pantoprazole (PROTONIX) 40 MG tablet [id number] Yes TAKE 1 TABLET BY MOUTH ONCE DAILY *REPLACES  DEXILANT* Panosh, Neta Mends, MD Taking Active   rosuvastatin (CRESTOR) 5 MG tablet [id number]  Take 1 by mouth 2 x per week  can increase to 3 x per week as tolerated  Patient not taking: Reported on 05/08/2022   Madelin Headings, MD  Active            Med Note Sherrill Raring   Wed Jul 10, 2022  1:42 PM) Restarting twice weekly on 5/15  ULORIC 40 MG tablet [id number] Yes Take 40 mg by mouth daily.  [provider] Taking Active Self            SDOH:  (Social Determinants of Health) assessments and interventions performed: Yes SDOH Interventions    Flowsheet Row Care Coordination from 07/10/2022 in [facility] Clinical Support from 04/17/2022 in [facility] Coordination from 11/19/2021 in [facility] Care Coordination Clinical Support from 04/05/2021 in [facility] [place] Clinical Support from 04/27/2020 in [facility] [place]  SDOH Interventions       Food Insecurity Interventions Intervention Not Indicated Intervention Not Indicated Intervention Not Indicated  Intervention Not Indicated Intervention Not Indicated  Housing Interventions Intervention Not Indicated Intervention Not Indicated Intervention Not Indicated Intervention Not Indicated Intervention Not Indicated  Transportation Interventions -- Intervention Not Indicated Intervention Not Indicated Intervention Not Indicated Intervention Not Indicated  Utilities Interventions -- Intervention Not Indicated Intervention Not Indicated -- --  Alcohol Usage Interventions -- Intervention Not Indicated (Score <7) -- -- --  Financial Strain Interventions -- Intervention Not Indicated Intervention Not Indicated  [pt is working on getting into low income housing] -- Intervention Not Indicated  Physical Activity Interventions -- Intervention Not Indicated -- Intervention Not Indicated Intervention Not Indicated  Stress Interventions -- Intervention Not Indicated -- Intervention Not Indicated Intervention Not Indicated  Social Connections Interventions -- Intervention Not Indicated -- Intervention Not Indicated Intervention Not Indicated       Medication Assistance: None required.  Patient affirms current coverage meets needs.  Medication Access: Within the past 30 days, how often has patient missed a dose of medication? None except intentional holding of statin since early March Is a pillbox or other method used to improve adherence? Yes  Factors that may affect medication adherence? adverse effects of medications Are meds synced by current pharmacy? No  Are meds delivered by current pharmacy? No  Does patient experience delays in picking up medications due to transportation concerns? No   [facility] Reviewed: Is patient disadvantaged to use [facility]?: Yes  Current Rx insurance plan: [employer] Name and location of Current pharmacy:  [facility] - [place] ([place]), [place] - [address] [place] ([place]) [place] [address] Phone: [phone] Fax:  [phone]  [facility] services reviewed with patient today?: No  Patient requests to transfer care to [facility]?: No  Reason patient declined to change pharmacies: Disadvantaged due to insurance/mail order  Compliance/Adherence/Medication fill history: Care Gaps: COVID Booster - Overdue TDAP - Overdue Zoster Vaccine - Postponed AWV - 04/17/22  Star-Rating Drugs: Losartan 50 mg - Last filled 06/27/22 90 DS at [facility] Rosuvastatin 5 mg - Last filled 07/23/21 84 DS at [facility]   Assessment/Plan Hypertension (BP goal <140/90) -Controlled -Current treatment: Lasix 40mg  1 qd Appropriate, Effective, Safe, Accessible Losartan 50mg  1 and 1/2 tabs qd Appropriate, Effective, Safe, Accessible -Medications previously tried: HCTZ  -Current home readings: has a machine but not checking at home, hasn't pulled out cuff since recent move -Current dietary habits: mindful of salt intake -Current exercise habits: walks with assistance of cane for balance aid -Denies hypotensive/hypertensive symptoms -Educated on BP goals and benefits of medications for prevention of heart attack, stroke and kidney damage; Exercise goal of 150 minutes per week; Importance of home blood pressure monitoring; Proper BP monitoring technique; -Counseled to monitor BP at home weekly or if having sx of hypo/hypertension, document, and provide log at future appointments -Recommended to continue current medication  Hyperlipidemia: (LDL goal < 100) -Uncontrolled -Current treatment: Zetia 10mg  1 qd Appropriate, Query effective Rosuvastatin 5mg  TIW -  not taking -Medications previously tried: Pravastatin, Simvastatin  -Current dietary patterns: not discussed  -Current exercise habits: see above -Educated on Cholesterol goals;  Benefits of statin for ASCVD risk reduction; Importance of limiting foods high in cholesterol; Exercise goal of 150 minutes per week; Strategies to manage statin-induced  myalgias; -Counseled on diet and exercise extensively Recommended restarting statin with twice weekly dosing and adding a daily CoQ10 supplement to aid with myalgia.   GERD (Goal: minimize symptoms of reflux ) -Not assessed today -Current treatment  Pantoprazole 40mg  1 qd Appropriate, Effective, Safe, Accessible  Vit B12 Def (Goal: B12 WNL) -Not assessed today -Current treatment  Vit B12 1 qd Appropriate, Effective, Safe, Accessible  Query Gout (Goal: Reduce and prevent gout flares) -Not assessed today -Current treatment  Uloric 40mg  1 qd Appropriate, Effective, Safe, Accessible  OTC -Not assessed today -Current treatment  Vitamin C 500mg  1 qd Appropriate, Effective, Safe, Accessible Vitamin D 1000 units 1 qd Appropriate, Effective, Safe, Accessible    Sherrill Raring Clinical Pharmacist [phone]

## 2022-07-10 ENCOUNTER — Telehealth: Payer: Self-pay | Admitting: Interventional Cardiology

## 2022-07-10 ENCOUNTER — Ambulatory Visit: Payer: Medicare HMO

## 2022-07-10 MED ORDER — FUROSEMIDE 40 MG PO TABS: 40.0000 mg | ORAL_TABLET | Freq: Every day | ORAL | 0 refills | Status: DC

## 2022-07-10 NOTE — Telephone Encounter (Signed)
*  STAT* If patient is at the pharmacy, call can be transferred to refill team.   1. Which medications need to be refilled? (please list name of each medication and dose if known)   furosemide (LASIX) 40 MG tablet    2. Which pharmacy/location (including street and city if local pharmacy) is medication to be sent to?    [facility] - [place] ([place]), [place] - [address] Phone: [phone]  Fax: [phone]      3. Do they need a 30 day or 90 day supply? 90

## 2022-07-10 NOTE — Telephone Encounter (Signed)
Call placed to pt.  She is overdue for an appointment.  She has been scheduled to see Tereso Newcomer, PA-C 07/24/22.  30 day refill of Lasix has been sent to [facility].

## 2022-07-15 ENCOUNTER — Other Ambulatory Visit: Payer: Self-pay | Admitting: Family

## 2022-07-15 ENCOUNTER — Telehealth: Payer: Self-pay | Admitting: Internal Medicine

## 2022-07-15 MED ORDER — ROSUVASTATIN CALCIUM 5 MG PO TABS: ORAL_TABLET | ORAL | 3 refills | Status: DC

## 2022-07-15 NOTE — Telephone Encounter (Signed)
Prescription Request  07/15/2022  LOV: 05/08/2022  What is the name of the medication or equipment?    rosuvastatin (CRESTOR) 5 MG tablet  Have you contacted your pharmacy to request a refill? No   Which pharmacy would you like this sent to?  [facility] - [place] ([place]), [place] - [address] [place] ([place]) [place] [address] Phone: [phone] Fax: [phone]    Patient notified that their request is being sent to the clinical staff for review and that they should receive a response within 2 business days.   Please advise at Mobile [phone] (mobile)

## 2022-07-16 ENCOUNTER — Other Ambulatory Visit: Payer: Self-pay

## 2022-07-16 DIAGNOSIS — E2839 Other primary ovarian failure: Secondary | ICD-10-CM

## 2022-07-23 DIAGNOSIS — R0609 Other forms of dyspnea: Secondary | ICD-10-CM | POA: Insufficient documentation

## 2022-07-23 DIAGNOSIS — R600 Localized edema: Secondary | ICD-10-CM | POA: Insufficient documentation

## 2022-07-23 NOTE — Progress Notes (Unsigned)
Cardiology Office Note:    Date:  07/24/2022   ID:  OKIE KASKA, DOB 08-12-1945, MRN [id number]  PCP:  Madelin Headings, MD   [facility] Providers Cardiologist:  Lance Muss, MD     Referring MD: Madelin Headings, MD   Chief complaint: Follow up lower extremity edema and shortness of breath   History of Present Illness:    Cynthia Estrada is a 77 y.o. female with a hx of HTN, hyperlipidemia, lower extremity edema, shortness of breath and renal insufficiency.   She was last seen by Dr. Eldridge Dace on 01/22/22 for increased LE edema and shortness of breath. At that time he noted LE edema was likely from venous insufficiency, recommended compression stockings. Per Dr Hoyle Barr note she appeared fluid overloaded at that time, her furosemide was increased to 40mg  twice a day for 3 days.   Today she reports she is doing well overall. She denies chest pain, shortness of breath, dyspnea on exertion, palpitations, lower extremity edema, dizziness or syncope. Her lower extremity edema and shortness of breath has significantly improved since her last visit with Dr. Eldridge Dace. Intermittently wears compression stockings.    Past Medical History:  Diagnosis Date   ANXIETY 11/11/2006   Arthritis    BACK PAIN, LUMBAR 07/11/2008   Cataract    right eye removed    Chest pain 06/12/2010   Ok stress echo has Gi sx also cw gerd.    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   Retinal detachment    SYNCOPE, HX OF 02/10/2007   UNSPECIFIED ARTHROPATHY SITE UNSPECIFIED 01/12/2007   Unspecified vitamin D deficiency 02/10/2007    Past Surgical History:  Procedure Laterality  Date   ABDOMINAL HYSTERECTOMY     age 8 for fibroids   CATARACT EXTRACTION Right    done with retinal detachment repair    COLON SURGERY  08/2009   COLONOSCOPY  07/01/2009   Two tubulovillous adenoms 7 and 4 cm size   COLONOSCOPY W/ POLYPECTOMY  07/11/2010   two 7-8 mm polyps removed - adenomas   hemicollectomy  08/2009   Right for tubulovillous adenomas (2)   RETINAL DETACHMENT SURGERY      Current Medications: Current Meds  Medication Sig   Ascorbic Acid (VITAMIN C) 500 MG tablet Take 500 mg by mouth daily.   cholecalciferol (VITAMIN D3) 25 MCG (1000 UNIT) tablet Take 1,000 Units by mouth daily.   cyanocobalamin 100 MCG tablet Take 100 mcg by mouth as needed.   ezetimibe (ZETIA) 10 MG tablet Take 1 tablet (10 mg total) by mouth daily.   furosemide (LASIX) 40 MG tablet Take 1 tablet (40 mg total) by mouth daily.   losartan (COZAAR) 50 MG tablet TAKE 1 & 1/2 (ONE & ONE-HALF) TABLETS BY MOUTH ONCE DAILY   pantoprazole (PROTONIX) 40 MG tablet TAKE 1 TABLET BY MOUTH ONCE DAILY *REPLACES  DEXILANT*   rosuvastatin (CRESTOR) 5 MG tablet Take 1 by mouth 2 x per week  can increase to 3 x per week as tolerated   ULORIC 40 MG tablet Take 40 mg by mouth daily.      Allergies:  Simvastatin, Atorvastatin, Lisinopril, and Pravastatin   Social History   Socioeconomic History   Marital status: Single    Spouse name: Not on file   Number of children: Not on file   Years of education: Not on file   Highest education level: Not on file  Occupational History   Occupation: Retire    Comment: [employer]  Tobacco Use   Smoking status: Never   Smokeless tobacco: Never  Vaping Use   Vaping Use: Never used  Substance and Sexual Activity   Alcohol use: No   Drug use: No   Sexual activity: Not on file  Other Topics Concern   Not on file  Social History Narrative   HH of 1    No pets   Retired from [employer] walking  onlylimited by knee oa changes    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    Social Determinants of Health   Financial Resource Strain: Low Risk  (04/17/2022)   Overall Financial Resource Strain (CARDIA)    Difficulty of Paying Living Expenses: Not hard at all  Food Insecurity: No Food Insecurity (07/10/2022)   Hunger Vital Sign    Worried About Running Out of Food in the Last Year: Never true    Ran Out of Food in the Last Year: Never true  Transportation Needs: No Transportation Needs (04/17/2022)   PRAPARE - Administrator, Civil Service (Medical): No    Lack of Transportation (Non-Medical): No  Physical Activity: Insufficiently Active (04/17/2022)   Exercise Vital Sign    Days of Exercise per Week: 2 days    Minutes of Exercise per Session: 30 min  Stress: No Stress Concern Present (04/17/2022)   [employer] of Occupational Health - Occupational Stress Questionnaire    Feeling of Stress : Not at all  Social Connections: Moderately Integrated (04/17/2022)   Social Connection and Isolation Panel [NHANES]    Frequency of Communication with Friends and Family: More than three times a week    Frequency of Social Gatherings with Friends and Family: More than three times a week    Attends Religious Services: More than 4 times per year    Active Member of [employer] or Organizations: Yes    Attends Engineer, structural: More than 4 times per year    Marital Status: Never married     Family History: The patient's family history includes Diabetes in her mother; Hyperlipidemia in her brother; Hypertension in her brother, mother, and sister. There is no history of Colon cancer, Pancreatic cancer, Rectal cancer, Stomach cancer, Colon polyps, Breast cancer, or Esophageal cancer.  ROS:   Please see the history of present illness.    All other systems reviewed and are negative.  EKGs/Labs/Other Studies Reviewed:    The following studies were reviewed today:  Echocardiogram  07/29/19 Left ventricular ejection fraction 60 to 65%, normal function, no regional wall motion abnormalities. Left Ventricular diastolic parameters are consistent with Grade I diastolic dysfunction. Right ventricular systolic function is normal, right ventricular size is normal.  Mitral valve is grossly normal, no evidence of mitral valve regurgitation. No evidence of mitral stenosis. Aortic valve is tricuspid. Aortic valve regurgitation is not visualized. No aortic stenosis is present.  Inferior vena cava is normal in size with greater than 50% respiratory variability, right atrial pressure of .   EKG:   01/22/22: NSR at 80 bpm  Recent Labs: 05/08/2022: ALT 22; BUN 10; Creatinine, Ser 1.30; Hemoglobin 14.5; Magnesium 1.7; Platelets 225.0; Potassium 4.0; Sodium 136  Recent Lipid Panel    Component Value Date/Time   CHOL 269 (H) 05/08/2022 1233   TRIG 175.0 (H) 05/08/2022 1233   TRIG 76 01/15/2006 0809   HDL 75.20 05/08/2022 1233   CHOLHDL 4 05/08/2022 1233   VLDL 35.0 05/08/2022 1233   LDLCALC 159 (H) 05/08/2022 1233   LDLCALC 160 (H) 02/02/2020 0952   LDLDIRECT 171.0 12/20/2015 0826     Physical Exam:    VS:  BP 134/82 (BP Location: Right Arm, Patient Position: Sitting)   Pulse 70   Ht 5\' 4"  (1.626 m)   Wt 241 lb 6.4 oz (109.5 kg)   SpO2 97%   BMI 41.44 kg/m     Wt Readings from Last 3 Encounters:  07/24/22 241 lb 6.4 oz (109.5 kg)  05/08/22 242 lb 9.6 oz (110 kg)  04/17/22 249 lb 4.8 oz (113.1 kg)     GEN: Well nourished, well developed in no acute distress NECK: No JVD; No carotid bruits LYMPHATICS: No lymphadenopathy CARDIAC: RRR, no murmurs, rubs, gallops RESPIRATORY:  Clear to auscultation without rales, wheezing or rhonchi  ABDOMEN: Soft, non-tender, non-distended MUSCULOSKELETAL:  No edema; No deformity  SKIN: Warm and dry NEUROLOGIC:  Alert and oriented x 3 PSYCHIATRIC:  Normal affect   ASSESSMENT:    1. Lower extremity edema   2. Dyspnea on  exertion   3. Essential hypertension   4. HYPERLIPIDEMIA    PLAN:    In order of problems listed above: 1. Lower extremity edema Reports her bilateral lower extremity edema has greatly improved. Currently taking furosemide 40mg  daily, tolerating well. Using compression stockings as needed.   2. Dyspnea on exertion Her dyspnea on exertion has improved, feels she has been able to complete her normal activities without difficulty. Currently going to senior center multiple times during the week.   3. Essential hypertension Blood pressure today initially 140/82, after recheck came down to 134/82. Occasionally will monitor at home, reports systolic BP in lower thirties after getting up in the morning. She plans to start monitoring BP at home. No changes made to current medication regiment of losartan 75mg  daily and furosemide 40mg  daily.  BP Readings from Last 3 Encounters:  07/24/22 134/82  05/08/22 138/76  04/17/22 118/62    4. HYPERLIPIDEMIA Recently restarted rosuvastatin 5mg  twice a week. Current goal to increase to 3 times a week if tolerated. Is also taking ezetimibe 10mg  daily. Has history of statin intolerance with myalgia, has previously tried simvastatin, atorvastatin and pravastatin.  Currently working with PCP and Pharm D in care management and coordination for cholesterol management. Encouraged to continue working towards weight loss and healthy diet. Discussed if she is unable to tolerate rosuvastatin can consider referral to lipid clinic for PCSK9i.  Lab Results  Component Value Date   CHOL 269 (H) 05/08/2022   HDL 75.20 05/08/2022   LDLCALC 159 (H) 05/08/2022   LDLDIRECT 171.0 12/20/2015   TRIG 175.0 (H) 05/08/2022   CHOLHDL 4 05/08/2022      Medication Adjustments/Labs and Tests Ordered: Current medicines are reviewed at length with the patient today.  Concerns regarding medicines are outlined above.  No orders of the defined types were placed in this encounter.  No  orders of the defined types were placed in this encounter.   Patient Instructions  Medication Instructions:  Your physician recommends that you continue on your current  medications as directed. Please refer to the Current Medication list given to you today.  *If you need a refill on your cardiac medications before your next appointment, please call your pharmacy*   Lab Work: None ordered  If you have labs (blood work) drawn today and your tests are completely normal, you will receive your results only by: MyChart Message (if you have MyChart) OR A paper copy in the mail If you have any lab test that is abnormal or we need to change your treatment, we will call you to review the results.   Testing/Procedures: None ordered   Follow-Up: At [facility], you and your health needs are our priority.  As part of our continuing mission to provide you with exceptional heart care, we have created designated Provider Care Teams.  These Care Teams include your primary Cardiologist (physician) and Advanced Practice Providers (APPs -  Physician Assistants and Nurse Practitioners) who all work together to provide you with the care you need, when you need it.  We recommend signing up for the patient portal called "MyChart".  Sign up information is provided on this After Visit Summary.  MyChart is used to connect with patients for Virtual Visits (Telemedicine).  Patients are able to view lab/test results, encounter notes, upcoming appointments, etc.  Non-urgent messages can be sent to your provider as well.   To learn more about what you can do with MyChart, go to [email or web address].    Your next appointment:   12 month(s)  Provider:   Lance Muss, MD     Other Instructions    Signed, Rip Harbour, NP  07/24/2022 4:28 PM    [facility]

## 2022-07-24 ENCOUNTER — Ambulatory Visit: Payer: Medicare HMO | Attending: Physician Assistant | Admitting: Cardiology

## 2022-07-24 ENCOUNTER — Encounter: Payer: Self-pay | Admitting: Cardiology

## 2022-07-24 VITALS — BP 134/82 | HR 70 | Ht 64.0 in | Wt 241.4 lb

## 2022-07-24 DIAGNOSIS — I10 Essential (primary) hypertension: Secondary | ICD-10-CM

## 2022-07-24 DIAGNOSIS — R600 Localized edema: Secondary | ICD-10-CM | POA: Diagnosis not present

## 2022-07-24 DIAGNOSIS — R0609 Other forms of dyspnea: Secondary | ICD-10-CM | POA: Diagnosis not present

## 2022-07-24 DIAGNOSIS — E782 Mixed hyperlipidemia: Secondary | ICD-10-CM | POA: Diagnosis not present

## 2022-07-24 NOTE — Patient Instructions (Signed)
Medication Instructions:  Your physician recommends that you continue on your current medications as directed. Please refer to the Current Medication list given to you today. *If you need a refill on your cardiac medications before your next appointment, please call your pharmacy*   Lab Work: None ordered If you have labs (blood work) drawn today and your tests are completely normal, you will receive your results only by: MyChart Message (if you have MyChart) OR A paper copy in the mail If you have any lab test that is abnormal or we need to change your treatment, we will call you to review the results.   Testing/Procedures: None ordered   Follow-Up: At [facility], you and your health needs are our priority.  As part of our continuing mission to provide you with exceptional heart care, we have created designated Provider Care Teams.  These Care Teams include your primary Cardiologist (physician) and Advanced Practice Providers (APPs -  Physician Assistants and Nurse Practitioners) who all work together to provide you with the care you need, when you need it.  We recommend signing up for the patient portal called "MyChart".  Sign up information is provided on this After Visit Summary.  MyChart is used to connect with patients for Virtual Visits (Telemedicine).  Patients are able to view lab/test results, encounter notes, upcoming appointments, etc.  Non-urgent messages can be sent to your provider as well.   To learn more about what you can do with MyChart, go to [email or web address].    Your next appointment:   12 month(s)  Provider:   Jayadeep Varanasi, MD     Other Instructions   

## 2022-08-08 ENCOUNTER — Other Ambulatory Visit: Payer: Self-pay | Admitting: Internal Medicine

## 2022-09-08 ENCOUNTER — Other Ambulatory Visit: Payer: Self-pay | Admitting: Internal Medicine

## 2022-09-08 ENCOUNTER — Other Ambulatory Visit: Payer: Self-pay | Admitting: Interventional Cardiology

## 2022-10-01 ENCOUNTER — Other Ambulatory Visit: Payer: Self-pay | Admitting: Internal Medicine

## 2022-10-08 DIAGNOSIS — M109 Gout, unspecified: Secondary | ICD-10-CM | POA: Diagnosis not present

## 2022-10-08 DIAGNOSIS — M25572 Pain in left ankle and joints of left foot: Secondary | ICD-10-CM | POA: Diagnosis not present

## 2022-10-08 DIAGNOSIS — R768 Other specified abnormal immunological findings in serum: Secondary | ICD-10-CM | POA: Diagnosis not present

## 2022-10-08 DIAGNOSIS — M1990 Unspecified osteoarthritis, unspecified site: Secondary | ICD-10-CM | POA: Diagnosis not present

## 2022-10-08 DIAGNOSIS — M79672 Pain in left foot: Secondary | ICD-10-CM | POA: Diagnosis not present

## 2022-10-08 DIAGNOSIS — M25561 Pain in right knee: Secondary | ICD-10-CM | POA: Diagnosis not present

## 2022-10-08 DIAGNOSIS — I10 Essential (primary) hypertension: Secondary | ICD-10-CM | POA: Diagnosis not present

## 2022-10-08 DIAGNOSIS — M25562 Pain in left knee: Secondary | ICD-10-CM | POA: Diagnosis not present

## 2022-10-08 DIAGNOSIS — Z79899 Other long term (current) drug therapy: Secondary | ICD-10-CM | POA: Diagnosis not present

## 2022-10-08 DIAGNOSIS — M25571 Pain in right ankle and joints of right foot: Secondary | ICD-10-CM | POA: Diagnosis not present

## 2022-10-08 DIAGNOSIS — M79671 Pain in right foot: Secondary | ICD-10-CM | POA: Diagnosis not present

## 2022-10-09 ENCOUNTER — Other Ambulatory Visit: Payer: Self-pay | Admitting: Family

## 2022-10-09 ENCOUNTER — Telehealth: Payer: Self-pay

## 2022-10-09 MED ORDER — ROSUVASTATIN CALCIUM 5 MG PO TABS: 5.0000 mg | ORAL_TABLET | Freq: Every day | ORAL | 0 refills | Status: DC

## 2022-10-09 NOTE — Telephone Encounter (Signed)
Rosuvastatin Cal 5mg    Pharmacy sent a fax and stated " pt reports taking 1 tablet once daily. Please send new Rx Reflecting this change. Thanks!  Original sig is " take 1 tablet by mouth twice per week, can increase to 1 tablet by mouth three times a week as tolerated"  Spoke to pt and confirm.   Okay to send new Rx?  Please advise.

## 2022-10-09 NOTE — Telephone Encounter (Signed)
Change to taking once a day as per direction

## 2022-10-09 NOTE — Telephone Encounter (Signed)
New Rx of Rosuvastatin 5 mg sent in.

## 2022-10-16 DIAGNOSIS — M25569 Pain in unspecified knee: Secondary | ICD-10-CM | POA: Diagnosis not present

## 2022-10-16 DIAGNOSIS — M79669 Pain in unspecified lower leg: Secondary | ICD-10-CM | POA: Diagnosis not present

## 2022-10-21 ENCOUNTER — Encounter: Payer: Self-pay | Admitting: Podiatry

## 2022-10-21 ENCOUNTER — Ambulatory Visit (INDEPENDENT_AMBULATORY_CARE_PROVIDER_SITE_OTHER): Payer: Medicare HMO

## 2022-10-21 ENCOUNTER — Other Ambulatory Visit: Payer: Self-pay | Admitting: Interventional Cardiology

## 2022-10-21 ENCOUNTER — Ambulatory Visit (INDEPENDENT_AMBULATORY_CARE_PROVIDER_SITE_OTHER): Payer: Medicare HMO | Admitting: Podiatry

## 2022-10-21 DIAGNOSIS — G609 Hereditary and idiopathic neuropathy, unspecified: Secondary | ICD-10-CM

## 2022-10-21 DIAGNOSIS — M109 Gout, unspecified: Secondary | ICD-10-CM | POA: Diagnosis not present

## 2022-10-21 DIAGNOSIS — M779 Enthesopathy, unspecified: Secondary | ICD-10-CM | POA: Diagnosis not present

## 2022-10-21 MED ORDER — GABAPENTIN 100 MG PO CAPS: 100.0000 mg | ORAL_CAPSULE | Freq: Every day | ORAL | 3 refills | Status: DC

## 2022-10-21 NOTE — Progress Notes (Signed)
  Subjective:  Patient ID: Cynthia Estrada, female    DOB: [birth year],  MRN: [id number]  Chief Complaint  Patient presents with   Toe Pain    "I have stabbing pains in my toes and in my feet." N - pain in my toes and feet L - 1-5 bilateral D - 2 mos O - suddenly, gradually worse C - shoot pains A - when I go to bed at night T - none    78 y.o. female presents with the above complaint. History confirmed with patient.   Objective:  Physical Exam: warm, good capillary refill, no trophic changes or ulcerative lesions, normal DP and PT pulses, normal monofilament exam, and normal sensory exam. Assessment:   1. Gout of both feet   2. Idiopathic neuropathy      Plan:  Patient was evaluated and treated and all questions answered.  She does have a history of gout but has no arthritic symptoms on clinical exam or in her radiographs today.  Considering the time course that this primarily happens the night I suspect she does have some idiopathic neuropathy.  She says it is severe enough to wake her up at night.  We discussed treatment of this including symptomatic treatment with gabapentin.  We discussed the risk benefits and potential side effects of gabapentin.  Recommend starting a low-dose 100 mg nightly can increase to 300 mg nightly and then add additional daytime doses as needed.  She will let me know if she needs increasing in her dose regimen and I will follow-up with her as needed.  If worsening or not improving could consider referral to neurology for further evaluation.  Rx sent to pharmacy Return if symptoms worsen or fail to improve.

## 2022-10-23 DIAGNOSIS — M25569 Pain in unspecified knee: Secondary | ICD-10-CM | POA: Diagnosis not present

## 2022-10-23 DIAGNOSIS — M79669 Pain in unspecified lower leg: Secondary | ICD-10-CM | POA: Diagnosis not present

## 2022-10-23 DIAGNOSIS — R2681 Unsteadiness on feet: Secondary | ICD-10-CM | POA: Diagnosis not present

## 2022-10-25 DIAGNOSIS — M25569 Pain in unspecified knee: Secondary | ICD-10-CM | POA: Diagnosis not present

## 2022-10-25 DIAGNOSIS — R2681 Unsteadiness on feet: Secondary | ICD-10-CM | POA: Diagnosis not present

## 2022-10-25 DIAGNOSIS — M79669 Pain in unspecified lower leg: Secondary | ICD-10-CM | POA: Diagnosis not present

## 2022-10-28 DIAGNOSIS — R2681 Unsteadiness on feet: Secondary | ICD-10-CM | POA: Diagnosis not present

## 2022-10-28 DIAGNOSIS — M25569 Pain in unspecified knee: Secondary | ICD-10-CM | POA: Diagnosis not present

## 2022-10-28 DIAGNOSIS — M79669 Pain in unspecified lower leg: Secondary | ICD-10-CM | POA: Diagnosis not present

## 2022-10-30 DIAGNOSIS — M25569 Pain in unspecified knee: Secondary | ICD-10-CM | POA: Diagnosis not present

## 2022-10-30 DIAGNOSIS — M79669 Pain in unspecified lower leg: Secondary | ICD-10-CM | POA: Diagnosis not present

## 2022-10-30 DIAGNOSIS — R2681 Unsteadiness on feet: Secondary | ICD-10-CM | POA: Diagnosis not present

## 2022-11-08 DIAGNOSIS — M79669 Pain in unspecified lower leg: Secondary | ICD-10-CM | POA: Diagnosis not present

## 2022-11-08 DIAGNOSIS — M25569 Pain in unspecified knee: Secondary | ICD-10-CM | POA: Diagnosis not present

## 2022-11-08 DIAGNOSIS — R2681 Unsteadiness on feet: Secondary | ICD-10-CM | POA: Diagnosis not present

## 2022-11-12 ENCOUNTER — Other Ambulatory Visit: Payer: Self-pay | Admitting: Internal Medicine

## 2022-11-21 DIAGNOSIS — M79669 Pain in unspecified lower leg: Secondary | ICD-10-CM | POA: Diagnosis not present

## 2022-11-21 DIAGNOSIS — R2681 Unsteadiness on feet: Secondary | ICD-10-CM | POA: Diagnosis not present

## 2022-11-21 DIAGNOSIS — M25569 Pain in unspecified knee: Secondary | ICD-10-CM | POA: Diagnosis not present

## 2022-11-25 ENCOUNTER — Other Ambulatory Visit: Payer: Self-pay | Admitting: Internal Medicine

## 2022-11-25 DIAGNOSIS — Z1231 Encounter for screening mammogram for malignant neoplasm of breast: Secondary | ICD-10-CM

## 2022-12-05 DIAGNOSIS — M79669 Pain in unspecified lower leg: Secondary | ICD-10-CM | POA: Diagnosis not present

## 2022-12-05 DIAGNOSIS — R2681 Unsteadiness on feet: Secondary | ICD-10-CM | POA: Diagnosis not present

## 2022-12-05 DIAGNOSIS — M25569 Pain in unspecified knee: Secondary | ICD-10-CM | POA: Diagnosis not present

## 2022-12-16 ENCOUNTER — Other Ambulatory Visit: Payer: Self-pay | Admitting: Internal Medicine

## 2022-12-26 ENCOUNTER — Other Ambulatory Visit: Payer: Self-pay | Admitting: Internal Medicine

## 2023-01-01 DIAGNOSIS — M25569 Pain in unspecified knee: Secondary | ICD-10-CM | POA: Diagnosis not present

## 2023-01-01 DIAGNOSIS — R2681 Unsteadiness on feet: Secondary | ICD-10-CM | POA: Diagnosis not present

## 2023-01-01 DIAGNOSIS — M79669 Pain in unspecified lower leg: Secondary | ICD-10-CM | POA: Diagnosis not present

## 2023-01-02 ENCOUNTER — Other Ambulatory Visit: Payer: Self-pay | Admitting: Internal Medicine

## 2023-01-11 ENCOUNTER — Other Ambulatory Visit: Payer: Self-pay | Admitting: Internal Medicine

## 2023-01-14 ENCOUNTER — Other Ambulatory Visit: Payer: Self-pay | Admitting: Family

## 2023-01-21 ENCOUNTER — Encounter: Payer: Self-pay | Admitting: Internal Medicine

## 2023-01-21 ENCOUNTER — Ambulatory Visit (INDEPENDENT_AMBULATORY_CARE_PROVIDER_SITE_OTHER): Payer: Medicare HMO | Admitting: Internal Medicine

## 2023-01-21 VITALS — BP 114/78 | HR 54 | Temp 98.3°F | Ht 65.0 in | Wt 239.0 lb

## 2023-01-21 DIAGNOSIS — E785 Hyperlipidemia, unspecified: Secondary | ICD-10-CM

## 2023-01-21 DIAGNOSIS — R739 Hyperglycemia, unspecified: Secondary | ICD-10-CM

## 2023-01-21 DIAGNOSIS — M2550 Pain in unspecified joint: Secondary | ICD-10-CM

## 2023-01-21 DIAGNOSIS — G479 Sleep disorder, unspecified: Secondary | ICD-10-CM | POA: Diagnosis not present

## 2023-01-21 DIAGNOSIS — Z79899 Other long term (current) drug therapy: Secondary | ICD-10-CM | POA: Diagnosis not present

## 2023-01-21 DIAGNOSIS — Z8739 Personal history of other diseases of the musculoskeletal system and connective tissue: Secondary | ICD-10-CM | POA: Diagnosis not present

## 2023-01-21 DIAGNOSIS — Z0000 Encounter for general adult medical examination without abnormal findings: Secondary | ICD-10-CM

## 2023-01-21 DIAGNOSIS — I10 Essential (primary) hypertension: Secondary | ICD-10-CM

## 2023-01-21 DIAGNOSIS — M159 Polyosteoarthritis, unspecified: Secondary | ICD-10-CM | POA: Diagnosis not present

## 2023-01-21 LAB — CBC WITH DIFFERENTIAL/PLATELET
Basophils Absolute: 0 10*3/uL (ref 0.0–0.1)
Basophils Relative: 0.5 % (ref 0.0–3.0)
Eosinophils Absolute: 0.1 10*3/uL (ref 0.0–0.7)
Eosinophils Relative: 1.5 % (ref 0.0–5.0)
HCT: 42.5 % (ref 36.0–46.0)
Hemoglobin: 13.8 g/dL (ref 12.0–15.0)
Lymphocytes Relative: 37 % (ref 12.0–46.0)
Lymphs Abs: 2.2 10*3/uL (ref 0.7–4.0)
MCHC: 32.4 g/dL (ref 30.0–36.0)
MCV: 83.6 fL (ref 78.0–100.0)
Monocytes Absolute: 0.8 10*3/uL (ref 0.1–1.0)
Monocytes Relative: 14 % — ABNORMAL HIGH (ref 3.0–12.0)
Neutro Abs: 2.8 10*3/uL (ref 1.4–7.7)
Neutrophils Relative %: 47 % (ref 43.0–77.0)
Platelets: 217 10*3/uL (ref 150.0–400.0)
RBC: 5.08 Mil/uL (ref 3.87–5.11)
RDW: 14.9 % (ref 11.5–15.5)
WBC: 6 10*3/uL (ref 4.0–10.5)

## 2023-01-21 LAB — LIPID PANEL
Cholesterol: 208 mg/dL — ABNORMAL HIGH (ref 0–200)
HDL: 66.4 mg/dL (ref >39.00)
LDL Cholesterol: 112 mg/dL — ABNORMAL HIGH (ref 0–99)
NonHDL: 141.25
Total CHOL/HDL Ratio: 3
Triglycerides: 147 mg/dL (ref 0.0–149.0)
VLDL: 29.4 mg/dL (ref 0.0–40.0)

## 2023-01-21 LAB — BASIC METABOLIC PANEL
BUN: 15 mg/dL (ref 6–23)
CO2: 28 meq/L (ref 19–32)
Calcium: 10.2 mg/dL (ref 8.4–10.5)
Chloride: 103 meq/L (ref 96–112)
Creatinine, Ser: 1.3 mg/dL — ABNORMAL HIGH (ref 0.40–1.20)
GFR: 39.29 mL/min — ABNORMAL LOW (ref >60.00)
Glucose, Bld: 102 mg/dL — ABNORMAL HIGH (ref 70–99)
Potassium: 3.9 meq/L (ref 3.5–5.1)
Sodium: 139 meq/L (ref 135–145)

## 2023-01-21 LAB — HEMOGLOBIN A1C: Hgb A1c MFr Bld: 6.2 % (ref 4.6–6.5)

## 2023-01-21 LAB — HEPATIC FUNCTION PANEL
ALT: 19 U/L (ref 0–35)
AST: 23 U/L (ref 0–37)
Albumin: 4.7 g/dL (ref 3.5–5.2)
Alkaline Phosphatase: 71 U/L (ref 39–117)
Bilirubin, Direct: 0.2 mg/dL (ref 0.0–0.3)
Total Bilirubin: 1.1 mg/dL (ref 0.2–1.2)
Total Protein: 7.1 g/dL (ref 6.0–8.3)

## 2023-01-21 LAB — URIC ACID: Uric Acid, Serum: 5.1 mg/dL (ref 2.4–7.0)

## 2023-01-21 LAB — TSH: TSH: 1.77 u[IU]/mL (ref 0.35–5.50)

## 2023-01-21 NOTE — Progress Notes (Signed)
Chief Complaint  Patient presents with   Annual Exam    HPI: Patient  Cynthia Estrada  77 y.o. comes in today for Preventive Health Care visit  And Chronic disease management  QM:VHQIONGEXB HLD: now on crestor 2 x per week 5 mg  as per cards and zetia? Card sees her  Pain  ms other   rheumatologist  taking therapy 20 copay so  only monthly   uloric  PPI :protonix  On gabapentin 100 at night  drowsy  given for pain Health Maintenance  Topic Date Due   COVID-19 Vaccine (4 - 2023-24 season) 02/06/2023 (Originally 10/27/2022)   Zoster Vaccines- Shingrix (1 of 2) 07/21/2023 (Originally 03/11/1964)   MAMMOGRAM  01/23/2023   Medicare Annual Wellness (AWV)  04/18/2023   DTaP/Tdap/Td (3 - Td or Tdap) 11/14/2032   Pneumonia Vaccine 70+ Years old  Completed   INFLUENZA VACCINE  Completed   DEXA SCAN  Completed   Hepatitis C Screening  Completed   HPV VACCINES  Aged Out   Colonoscopy  Discontinued   Health Maintenance Review LIFESTYLE:  Exercise:  walking and senior center  Tobacco/ETS: n Alcohol:  n Sugar beverages:  ginger ale  Sleep: recliner  dozes  9  then 2 am  awakens can't sleep in be causes soreness Drug use: no HH of  1 no pets  Less worry .   ROS:  GEN/ HEENT: No fever, significant weight changes sweats headaches vision problems hearing changes, ? Tooth lose r  upper CV/ PULM; No chest pain shortness of breath cough, syncope,edema  change in exercise tolerance. GI /GU: No adominal pain, vomiting, change in bowel habits. No blood in the stool.  SKIN/HEME: ,no acute skin rashes suspicious lesions or bleeding. No lymphadenopathy, nodules, masses.  NEURO/ PSYCH:  No  new neurologic signs such as weakness numbness. No depression anxiety. IMM/ Allergy: No unusual infections.  Allergy .   REST of 12 system review negative except as per HPI   Past Medical History:  Diagnosis Date   ANXIETY 11/11/2006   Arthritis    BACK PAIN, LUMBAR 07/11/2008   Cataract    right eye  removed    Chest pain 06/12/2010   Ok stress echo has Gi sx also cw gerd.    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   Retinal detachment    SYNCOPE, HX OF 02/10/2007   UNSPECIFIED ARTHROPATHY SITE UNSPECIFIED 01/12/2007   Unspecified vitamin D deficiency 02/10/2007    Past Surgical History:  Procedure Laterality Date   ABDOMINAL HYSTERECTOMY     age 65 for fibroids   CATARACT EXTRACTION Right    done with retinal detachment repair    COLON SURGERY  08/2009   COLONOSCOPY  07/01/2009   Two tubulovillous adenoms 7 and 4 cm size   COLONOSCOPY W/ POLYPECTOMY  07/11/2010   two 7-8 mm polyps removed - adenomas   hemicollectomy  08/2009   Right for tubulovillous adenomas (2)   RETINAL DETACHMENT SURGERY      Family History  Problem Relation Age of Onset   Hypertension Mother    Diabetes Mother    Hypertension Sister  Hypertension Brother    Hyperlipidemia Brother    Colon cancer Neg Hx    Pancreatic cancer Neg Hx    Rectal cancer Neg Hx    Stomach cancer Neg Hx    Colon polyps Neg Hx    Breast cancer Neg Hx    Esophageal cancer Neg Hx     Social History   Socioeconomic History   Marital status: Single    Spouse name: Not on file   Number of children: Not on file   Years of education: Not on file   Highest education level: Not on file  Occupational History   Occupation: Retire    Comment: [employer]  Tobacco Use   Smoking status: Never   Smokeless tobacco: Never  Vaping Use   Vaping status: Never Used  Substance and Sexual Activity   Alcohol use: No   Drug use: No   Sexual activity: Not on file  Other Topics Concern   Not on file  Social  History Narrative   HH of 1    No pets   Retired from [employer] walking  onlylimited by knee oa changes    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    Social Determinants of Health   Financial Resource Strain: Low Risk  (04/17/2022)   Overall Financial Resource Strain (CARDIA)    Difficulty of Paying Living Expenses: Not hard at all  Food Insecurity: No Food Insecurity (07/10/2022)   Hunger Vital Sign    Worried About Running Out of Food in the Last Year: Never true    Ran Out of Food in the Last Year: Never true  Transportation Needs: No Transportation Needs (04/17/2022)   PRAPARE - Administrator, Civil Service (Medical): No    Lack of Transportation (Non-Medical): No  Physical Activity: Insufficiently Active (01/21/2023)   Exercise Vital Sign    Days of Exercise per Week: 2 days    Minutes of Exercise per Session: 10 min  Stress: No Stress Concern Present (01/21/2023)   [employer] of Occupational Health - Occupational Stress Questionnaire    Feeling of Stress : Not at all  Social Connections: Moderately Integrated (04/17/2022)   Social Connection and Isolation Panel [NHANES]    Frequency of Communication with Friends and Family: More than three times a week    Frequency of Social Gatherings with Friends and Family: More than three times a week    Attends Religious Services: More than 4 times per year    Active Member of [employer] or Organizations: Yes    Attends Engineer, structural: More than 4 times per year    Marital Status: Never married    Outpatient Medications Prior to Visit  Medication Sig Dispense Refill   Ascorbic Acid (VITAMIN C) 500 MG tablet Take 500 mg by mouth daily.     cholecalciferol (VITAMIN D3) 25 MCG (1000 UNIT) tablet Take 1,000 Units by mouth daily.     cyanocobalamin 100 MCG tablet Take 100 mcg by mouth as needed.     ezetimibe (ZETIA) 10 MG tablet Take 1 tablet by  mouth once daily 90 tablet 0   furosemide (LASIX) 40 MG tablet Take 1 tablet (40 mg total) by mouth daily. 90 tablet 2   gabapentin (NEURONTIN) 100 MG capsule Take 1 capsule (100 mg total) by mouth at bedtime. 30 capsule 3   losartan (COZAAR) 50 MG tablet TAKE  1 & 1/2 (ONE & ONE-HALF) TABLETS BY MOUTH ONCE DAILY 135 tablet 0   pantoprazole (PROTONIX) 40 MG tablet TAKE 1 TABLET BY MOUTH ONCE DAILY (REPLACES  DEXILANT) 30 tablet 0   rosuvastatin (CRESTOR) 5 MG tablet Take 1 tablet (5 mg total) by mouth daily. 90 tablet 0   ULORIC 40 MG tablet Take 40 mg by mouth daily.      Facility-Administered Medications Prior to Visit  Medication Dose Route Frequency Provider Last Rate Last Admin   technetium tetrofosmin (TC-MYOVIEW) injection 10.1 millicurie  10.1 millicurie Intravenous Once PRN Pricilla Riffle, MD         EXAM:  BP 114/78 (BP Location: Right Arm, Patient Position: Sitting, Cuff Size: Large)   Pulse (!) 54   Temp 98.3 F (36.8 C) (Oral)   Ht 5\' 5"  (1.651 m)   Wt 239 lb (108.4 kg)   SpO2 98%   BMI 39.77 kg/m   Body mass index is 39.77 kg/m. Wt Readings from Last 3 Encounters:  01/21/23 239 lb (108.4 kg)  07/24/22 241 lb 6.4 oz (109.5 kg)  05/08/22 242 lb 9.6 oz (110 kg)    Physical Exam: Vital signs reviewed WUJ:WJXB is a well-developed well-nourished alert cooperative    who appearsr stated age in no acute distress.  HEENT: normocephalic atraumatic , Eyes: PERRL EOM's full, conjunctiva clear, Nares: paten,t no deformity discharge or tenderness., Ears: no deformity EAC's clear TMs with normal landmarks. Mouth: clear OP, no lesions, edema.  Moist mucous membranes. Dentition in adequate repair. NECK: supple without masses, thyromegaly or bruits. CHEST/PULM:  Clear to auscultation and percussion breath sounds equal no wheeze , rales or rhonchi. No chest wall deformities or tenderness. Breast: normal by inspection . No dimpling, discharge, masses, tenderness or discharge . CV:  PMI is nondisplaced, S1 S2 no gallops, murmurs, rubs. Peripheral pulses are full without delay.No JVD .  ABDOMEN: Bowel sounds normal nontender  No guard or rebound, no hepato splenomegal no CVA tenderness.  No hernia. Extremtities:  No clubbing cyanosis or edema, no acute joint swelling or redness no focal atrophy walks with cane  knee pain no acute swelling  NEURO:  Oriented x3, cranial nerves 3-12 appear to be intact, no obvious focal weakness,gait within normal limits no abnormal reflexes or asymmetrical SKIN: No acute rashes normal turgor, color, no bruising or petechiae. PSYCH: Oriented, good eye contact, no obvious depression anxiety, cognition and judgment appear normal. LN: no cervical axillary inguinal adenopathy  Lab Results  Component Value Date   WBC 5.6 05/08/2022   HGB 14.5 05/08/2022   HCT 43.9 05/08/2022   PLT 225.0 05/08/2022   GLUCOSE 96 05/08/2022   CHOL 269 (H) 05/08/2022   TRIG 175.0 (H) 05/08/2022   HDL 75.20 05/08/2022   LDLDIRECT 171.0 12/20/2015   LDLCALC 159 (H) 05/08/2022   ALT 22 05/08/2022   AST 23 05/08/2022   NA 136 05/08/2022   K 4.0 05/08/2022   CL 99 05/08/2022   CREATININE 1.30 (H) 05/08/2022   BUN 10 05/08/2022   CO2 29 05/08/2022   TSH 1.61 07/04/2017   HGBA1C 5.8 (A) 01/16/2022    BP Readings from Last 3 Encounters:  01/21/23 114/78  07/24/22 134/82  05/08/22 138/76   No major change in health status since last visit ..lastvitamind  Labplanreviewed with patient   ASSESSMENT AND PLAN:  Discussed the following assessment and plan:    ICD-10-CM   1. Visit for preventive health examination  Z00.00  2. Medication management  Z79.899 Basic metabolic panel    CBC with Differential/Platelet    Hemoglobin A1c    Hepatic function panel    Lipid panel    TSH    Uric Acid    3. Essential hypertension  I10 Basic metabolic panel    CBC with Differential/Platelet    Hemoglobin A1c    Hepatic function panel    Lipid panel    TSH     4. Hyperglycemia  R73.9 Basic metabolic panel    CBC with Differential/Platelet    Hemoglobin A1c    Hepatic function panel    Lipid panel    TSH    5. Hyperlipidemia, unspecified hyperlipidemia type  E78.5 Basic metabolic panel    CBC with Differential/Platelet    Hemoglobin A1c    Hepatic function panel    Lipid panel    TSH    6. Osteoarthritis of multiple joints, unspecified osteoarthritis type  M15.9 Basic metabolic panel    CBC with Differential/Platelet    Hemoglobin A1c    Hepatic function panel    Lipid panel    TSH    7. History of gout  Z87.39 Uric Acid    8. Multiple joint pain  M25.50     9. Disturbance in sleep behavior  G47.9    2 am wakening  uncertain cause  maybe pain habit and  mood? does better in recliner ?can try inc gabapentin consider trazadone short term ; avoid poly pharm     Bp controlled today  Update lipid management lab' Trial of inc gaba hx for sleep issues let us know how works or not   Return in about 1 year (around 01/21/2024) for depending on results.  Patient Care Team: Narciso Stoutenburg, Cynthia Mends, MD as PCP - General Corky Crafts, MD as PCP - Cardiology (Cardiology) Iva Boop, MD (Gastroenterology) Girard Cooter, MD as Referring Physician (Dermatology) Wendall Stade, MD (Cardiology) Rossie Muskrat, MD (Rheumatology) Corky Crafts, MD as Consulting Physician (Cardiology) Sherrill Raring, [facility] (Pharmacist) Patient Instructions  Consider trying taking  2 x 100 mg gabapentin at night to see if helps sleep .  Updated labs today   Will share with  team as possible     Cynthia Estrada. Cynthia Estrada M.D.

## 2023-01-21 NOTE — Telephone Encounter (Signed)
Pt just had her CPE this morning. Pt called to say she forgot to tell MD that she is completely out of this Rx:  pantoprazole (PROTONIX) 40 MG tablet  [id number] - [place] ([place]), [place] - [address] Phone: [phone]  Fax: [phone]

## 2023-01-21 NOTE — Patient Instructions (Addendum)
Consider trying taking  2 x 100 mg gabapentin at night to see if helps sleep .  Updated labs today   Will share with  team as possible

## 2023-01-27 ENCOUNTER — Ambulatory Visit
Admission: RE | Admit: 2023-01-27 | Discharge: 2023-01-27 | Disposition: A | Discharge status: Home / Self Care | Payer: Medicare HMO | Source: Ambulatory Visit | Attending: Internal Medicine | Admitting: Internal Medicine

## 2023-01-27 DIAGNOSIS — Z1231 Encounter for screening mammogram for malignant neoplasm of breast: Secondary | ICD-10-CM | POA: Diagnosis not present

## 2023-02-09 NOTE — Progress Notes (Signed)
Stable or normal  lab results w improved cholesterol   sharing with dr Antoine Poche and Marylene Land with pharmacy team

## 2023-02-10 ENCOUNTER — Telehealth: Payer: Self-pay | Admitting: Internal Medicine

## 2023-02-10 NOTE — Telephone Encounter (Signed)
Pt returned call for labs

## 2023-03-19 ENCOUNTER — Inpatient Hospital Stay: Admission: RE | Admit: 2023-03-19 | Payer: Medicare HMO | Source: Ambulatory Visit

## 2023-03-24 ENCOUNTER — Other Ambulatory Visit: Payer: Self-pay | Admitting: Internal Medicine

## 2023-03-24 DIAGNOSIS — E2839 Other primary ovarian failure: Secondary | ICD-10-CM

## 2023-04-01 ENCOUNTER — Ambulatory Visit: Payer: Self-pay | Admitting: Internal Medicine

## 2023-04-01 ENCOUNTER — Encounter (HOSPITAL_COMMUNITY): Payer: Self-pay

## 2023-04-01 ENCOUNTER — Ambulatory Visit (HOSPITAL_COMMUNITY)
Admission: EM | Admit: 2023-04-01 | Discharge: 2023-04-01 | Disposition: A | Discharge status: Home / Self Care | Payer: 59 | Attending: Family Medicine | Admitting: Family Medicine

## 2023-04-01 ENCOUNTER — Ambulatory Visit (INDEPENDENT_AMBULATORY_CARE_PROVIDER_SITE_OTHER): Payer: 59

## 2023-04-01 DIAGNOSIS — M79601 Pain in right arm: Secondary | ICD-10-CM | POA: Diagnosis not present

## 2023-04-01 MED ORDER — METHYLPREDNISOLONE SODIUM SUCC 125 MG IJ SOLR
INTRAMUSCULAR | Status: AC
  Filled 2023-04-01: qty 2

## 2023-04-01 MED ORDER — METHYLPREDNISOLONE SODIUM SUCC 125 MG IJ SOLR
60.0000 mg | Freq: Once | INTRAMUSCULAR | Status: AC
Start: 2023-04-01 — End: 2023-04-01
  Administered 2023-04-01: 60 mg via INTRAMUSCULAR

## 2023-04-01 MED ORDER — PREDNISONE 20 MG PO TABS
40.0000 mg | ORAL_TABLET | Freq: Every day | ORAL | 0 refills | Status: AC
Start: 2023-04-01 — End: 2023-04-06

## 2023-04-01 NOTE — ED Provider Notes (Signed)
 [facility]    CSN: [id number] Arrival date & time: 04/01/23  0957      History   Chief Complaint Chief Complaint  Patient presents with   Arm Pain    HPI Cynthia Estrada is a 78 y.o. female.    Arm Pain  Patient is here for right arm pain.  It started in her right shoulder, then to her elbow and into the the wrist. This started about 5 days ago, getting worse.  Pain is constant.   Last night she took gabapentin  and went to sleep.  She woke up with pain in the elbow and upper arm.  No known injury.  No neck pain.  She states the pain just started Friday evening, after she had been to the hair dresser.        Past Medical History:  Diagnosis Date   ANXIETY 11/11/2006   Arthritis    BACK PAIN, LUMBAR 07/11/2008   Cataract    right eye removed    Chest pain 06/12/2010   Ok stress echo has Gi sx also cw gerd.    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   Retinal detachment    SYNCOPE, HX OF 02/10/2007   UNSPECIFIED ARTHROPATHY SITE UNSPECIFIED 01/12/2007   Unspecified vitamin D  deficiency 02/10/2007    Patient Active Problem List   Diagnosis Date Noted   Lower extremity edema 07/23/2022   Dyspnea on exertion 07/23/2022   Body aches 11/01/2014   Dysuria 11/01/2014   Visit for preventive health examination 10/29/2013   Disturbance in sleep behavior 10/29/2013   Tingling in extremities 10/29/2013   Hyperglycemia 10/29/2013   Other malaise and fatigue 04/28/2012   Estrogen deficiency 04/28/2012   Medication side effect 01/02/2012   Medicare annual wellness visit, subsequent 10/22/2011   Myalgia 10/22/2011   Nausea alone 10/22/2011    GERD (gastroesophageal reflux disease) 04/16/2010   History of colonic polyps 07/11/2009    Class: Acute   LEG CRAMPS 01/12/2009   BACK PAIN, LUMBAR 07/11/2008   HOT FLASHES 04/07/2008   Vitamin D  deficiency 02/10/2007   SYNCOPE, HX OF 02/10/2007   HYPERLIPIDEMIA 01/12/2007   FASTING HYPERGLYCEMIA 01/12/2007   Anxiety state 11/11/2006   Essential hypertension 11/11/2006    Past Surgical History:  Procedure Laterality Date   ABDOMINAL HYSTERECTOMY     age 15 for fibroids   CATARACT EXTRACTION Right    done with retinal detachment repair    COLON SURGERY  08/2009   COLONOSCOPY  07/01/2009   Two tubulovillous adenoms 7 and 4 cm size   COLONOSCOPY W/ POLYPECTOMY  07/11/2010   two 7-8 mm polyps removed - adenomas   hemicollectomy  08/2009   Right for tubulovillous adenomas (2)   RETINAL DETACHMENT SURGERY      OB History   No obstetric history on file.      Home Medications    Prior to Admission medications   Medication Sig Start Date End Date Taking? Authorizing Provider  Ascorbic Acid (VITAMIN C) 500 MG tablet Take 500 mg by mouth daily.    [provider]  BOOSTRIX 5-2.5-18.5 LF-MCG/0.5 injection  11/15/22  [provider]  cholecalciferol (VITAMIN D3) 25 MCG (1000 UNIT) tablet Take 1,000 Units by mouth daily.    [provider]  cyanocobalamin 100 MCG tablet Take 100 mcg by mouth as needed.    [provider]  ezetimibe  (ZETIA ) 10 MG tablet Take 1 tablet by mouth once daily 01/11/23   Webb, Padonda B, FNP  FLUZONE HIGH-DOSE 0.5 ML injection  11/15/22   [provider]  furosemide  (LASIX ) 40 MG tablet Take 1 tablet (40 mg total) by mouth daily. 10/23/22   Dann Candyce RAMAN, MD  gabapentin  (NEURONTIN ) 100 MG capsule Take 1 capsule (100 mg total) by mouth at bedtime. 10/21/22 02/18/23  Silva Juliene SAUNDERS, DPM  losartan  (COZAAR ) 50 MG tablet TAKE 1 & 1/2 (ONE & ONE-HALF) TABLETS BY MOUTH ONCE DAILY 01/02/23   Webb, Padonda B, FNP   pantoprazole  (PROTONIX ) 40 MG tablet TAKE 1 TABLET BY MOUTH ONCE DAILY (REPLACES  DEXILANT ) 01/21/23   Panosh, Wanda K, MD  rosuvastatin  (CRESTOR ) 5 MG tablet Take 1 tablet (5 mg total) by mouth daily. 10/09/22   Panosh, Wanda K, MD  ULORIC 40 MG tablet Take 40 mg by mouth daily.  12/10/16   [provider]    Family History Family History  Problem Relation Age of Onset   Hypertension Mother    Diabetes Mother    Hypertension Sister    Hypertension Brother    Hyperlipidemia Brother    Colon cancer Neg Hx    Pancreatic cancer Neg Hx    Rectal cancer Neg Hx    Stomach cancer Neg Hx    Colon polyps Neg Hx    Breast cancer Neg Hx    Esophageal cancer Neg Hx    BRCA 1/2 Neg Hx     Social History Social History   Tobacco Use   Smoking status: Never   Smokeless tobacco: Never  Vaping Use   Vaping status: Never Used  Substance Use Topics   Alcohol use: No   Drug use: No     Allergies   Simvastatin, Atorvastatin, Lisinopril, and Pravastatin    Review of Systems Review of Systems  Constitutional: Negative.   HENT: Negative.    Respiratory: Negative.    Gastrointestinal: Negative.   Genitourinary: Negative.      Physical Exam Triage Vital Signs ED Triage Vitals  Encounter Vitals Group     BP 04/01/23 1137 (!) 154/82     Systolic BP Percentile --      Diastolic BP Percentile --      Pulse Rate 04/01/23 1137 (!) 56     Resp 04/01/23 1137 18     Temp 04/01/23 1137 98.1 F (36.7 C)     Temp Source 04/01/23 1137 Oral     SpO2 04/01/23 1137 96 %     Weight --      Height --      Head Circumference --      Peak Flow --      Pain Score 04/01/23 1133 10     Pain Loc --      Pain Education --      Exclude from Growth Chart --    No data found.  Updated Vital Signs BP (!) 154/82 (BP Location: Left Arm)   Pulse (!) 56   Temp 98.1 F (36.7 C) (Oral)   Resp 18   SpO2 96%   Visual Acuity Right Eye Distance:   Left Eye Distance:   Bilateral  Distance:  Right Eye Near:   Left Eye Near:    Bilateral Near:     Physical Exam Constitutional:      General: She is not in acute distress.    Appearance: Normal appearance. She is not ill-appearing or toxic-appearing.  HENT:     Head: Normocephalic.  Cardiovascular:     Rate and Rhythm: Normal rate and regular rhythm.  Pulmonary:     Effort: Pulmonary effort is normal.     Breath sounds: Normal breath sounds.  Musculoskeletal:     Cervical back: Normal range of motion and neck supple. No tenderness.     Comments: No spinous tenderness;  +ttp to the right upper back/trapezius area;  TTP to the anterior chest on the right; TTP to the right shoulder, upper arm, elbow and forearm;  Full rom of the right shoulder and elbow without limitation, but c/o pain with movement.   Skin:    General: Skin is warm.     Findings: No bruising, erythema, lesion or rash.  Neurological:     General: No focal deficit present.     Mental Status: She is alert and oriented to person, place, and time.     Sensory: No sensory deficit.     Motor: No weakness.  Psychiatric:        Mood and Affect: Mood normal.      UC Treatments / Results  Labs (all labs ordered are listed, but only abnormal results are displayed) Labs Reviewed - No data to display  EKG   Radiology No results found.  Procedures Procedures (including critical care time)  Medications Ordered in UC Medications  methylPREDNISolone  sodium succinate (SOLU-MEDROL ) 125 mg/2 mL injection 60 mg (has no administration in time range)    Initial Impression / Assessment and Plan / UC Course  I have reviewed the triage vital signs and the nursing notes.  Pertinent labs & imaging results that were available during my care of the patient were reviewed by me and considered in my medical decision making (see chart for details).  Final Clinical Impressions(s) / UC Diagnoses   Final diagnoses:  Right arm pain     Discharge  Instructions      You were seen today for arm pain.  Your xrays appear normal, but if the radiologist reads them differently we will notify you.  I think you may have a pinched nerve causing pain.  I have given you a shot of steroid here, and sent out a low dose steroid to take at home, starting tomorrow.  Please use tylenol  for pain.  If you continue with pain then please follow up with your primary care provider.     ED Prescriptions     Medication Sig Dispense Auth. Provider   predniSONE  (DELTASONE ) 20 MG tablet Take 2 tablets (40 mg total) by mouth daily for 5 days. 10 tablet Darral Longs, MD      PDMP not reviewed this encounter.   Darral Longs, MD 04/01/23 1236

## 2023-04-01 NOTE — ED Triage Notes (Addendum)
She just got over a cold and she started having right arm pain. Pt denies any injury. The pain radiates down her right arm and is constant.   Start date: Last Friday   Home Interventions: Tylenol, Gabapentin; Both did not relieve pain

## 2023-04-01 NOTE — Discharge Instructions (Addendum)
 You were seen today for arm pain.  Your xrays appear normal, but if the radiologist reads them differently we will notify you.  I think you may have a pinched nerve causing pain.  I have given you a shot of steroid here, and sent out a low dose steroid to take at home, starting tomorrow.  Please use tylenol  for pain.  If you continue with pain then please follow up with your primary care provider.

## 2023-04-01 NOTE — Telephone Encounter (Signed)
 Copied from CRM [id number]. Topic: Clinical - Red Word Triage >> Apr 01, 2023  8:54 AM Cynthia Estrada wrote: Red Word that prompted transfer to Nurse Triage: sharp pain in right arm started Saturday   Chief Complaint: Arm Pain, Right Symptoms: Throbbing Pain Frequency: Since Satuday Pertinent Negatives: Patient denies chest pain, shortness of breath Disposition: [] ED /[] Urgent Care (no appt availability in office) / [] Appointment(In office/virtual)/ []  [place] [facility]/ [] Home Care/ [] Refused Recommended Disposition /[]  Mobile Bus/ []  Follow-up with PCP Additional Notes: SJ is being triaged for right arm pain that she reports is now localized to her elbow. Initially the pain started in her right shoulder and has now moved all the way down her arm. The patient reports pain with exertion of the arm, but not at rest. Patient is opting to go to Urgent Care. Denies numbness or weakness to the arm.    Reason for Disposition  [1] Arm pains with exertion (e.g., walking) AND [2] pain goes away on resting AND [3] not present now  Answer Assessment - Initial Assessment Questions 1. ONSET: When did the pain start?     Saturday Night  2. LOCATION: Where is the pain located?     Right Arm  3. PAIN: How bad is the pain? (Scale 1-10; or mild, moderate, severe)   - MILD (1-3): Doesn't interfere with normal activities.   - MODERATE (4-7): Interferes with normal activities (e.g., work or school) or awakens from sleep.   - SEVERE (8-10): Excruciating pain, unable to do any normal activities, unable to hold a cup of water.     10  4. WORK OR EXERCISE: Has there been any recent work or exercise that involved this part of the body?     No  5. CAUSE: What do you think is causing the arm pain?     Unsure  6. OTHER SYMPTOMS: Do you have any other symptoms? (e.g., neck pain, swelling, rash, fever, numbness, weakness)     Swelling  7. PREGNANCY: Is there any chance you are pregnant?  When was your last menstrual period?     No  Protocols used: Arm Pain-A-AH

## 2023-04-22 ENCOUNTER — Other Ambulatory Visit: Payer: Self-pay | Admitting: Family

## 2023-04-24 ENCOUNTER — Other Ambulatory Visit: Payer: Self-pay | Admitting: Internal Medicine

## 2023-04-24 MED ORDER — EZETIMIBE 10 MG PO TABS: 10.0000 mg | ORAL_TABLET | Freq: Every day | ORAL | 1 refills | Status: DC

## 2023-04-24 NOTE — Telephone Encounter (Signed)
 Copied from CRM [id number]. Topic: Clinical - Medication Refill >> Apr 24, 2023  8:45 AM Shelbie Proctor wrote: Most Recent Primary Care Visit:  Provider: Berniece Andreas K  Department: [facility]  Visit Type: PHYSICAL  Date: 01/21/2023  Medication: ezetimibe (ZETIA) 10 MG tablet    Has the patient contacted their pharmacy? Yes (Agent: If no, request that the patient contact the pharmacy for the refill. If patient does not wish to contact the pharmacy document the reason why and proceed with request.) (Agent: If yes, when and what did the pharmacy advise?)  Is this the correct pharmacy for this prescription? Yes If no, delete pharmacy and type the correct one.  This is the patient's preferred pharmacy:  [id number] - [place] ([place]), [place] - [address] [place] [address] Phone: [phone] Fax: [phone]   Has the prescription been filled recently? No  Is the patient out of the medication? Yes, has been out of two days now.   Has the patient been seen for an appointment in the last year OR does the patient have an upcoming appointment? Yes  Can we respond through MyChart? No, please call at [phone]  Agent: Please be advised that Rx refills may take up to 3 business days. We ask that you follow-up with your pharmacy.

## 2023-04-26 ENCOUNTER — Other Ambulatory Visit: Payer: Self-pay | Admitting: Podiatry

## 2023-05-13 LAB — LAB REPORT - SCANNED: EGFR: 43

## 2023-05-26 ENCOUNTER — Encounter: Payer: Self-pay | Admitting: Rheumatology

## 2023-06-24 ENCOUNTER — Ambulatory Visit (INDEPENDENT_AMBULATORY_CARE_PROVIDER_SITE_OTHER): Payer: Medicare HMO | Admitting: Family Medicine

## 2023-06-24 ENCOUNTER — Other Ambulatory Visit: Payer: Self-pay | Admitting: Podiatry

## 2023-06-24 DIAGNOSIS — Z0000 Encounter for general adult medical examination without abnormal findings: Secondary | ICD-10-CM | POA: Diagnosis not present

## 2023-06-24 NOTE — Progress Notes (Signed)
 PATIENT CHECK-IN and HEALTH RISK ASSESSMENT QUESTIONNAIRE:  -completed by phone/video for upcoming Medicare Preventive Visit   Pre-Visit Check-in: 1)Vitals (height, wt, BP, etc) - record in vitals section for visit on day of visit Request home vitals (wt, BP, etc.) and enter into vitals, THEN update Vital Signs SmartPhrase below at the top of the HPI. See below.  2)Review and Update Medications, Allergies PMH, Surgeries, Social history in Epic 3)Hospitalizations in the last year with date/reason? n  4)Review and Update Care Team (patient's specialists) in Epic 5) Complete PHQ9 in Epic  6) Complete Fall Screening in Epic 7)Review all Health Maintenance Due and order under PCP if not done.  Medicare Wellness Patient Questionnaire:  Answer theses question about your habits: How often do you have a drink containing alcohol?n How many drinks containing alcohol do you have on a typical day when you are drinking?na How often do you have six or more drinks on one occasion?na Have you ever smoked?n Quit date if applicable? na  How many packs a day do/did you smoke? na Do you use smokeless tobacco?na Do you use an illicit drugs?na On average, how many days per week do you engage in moderate to strenuous exercise (like a brisk walk)? Senior exercise 3 days per week, for 20 minutes Are you sexually active? N, no concerns for STIs Typical breakfast: cereal or toast Typical lunch: gets lunch at the dining room Typical dinner: fish or chicken, veggies, broccoli Typical snacks: ice cream every other day  Beverages: a lot of water, occ ginger ale  Answer theses question about your everyday activities: Can you perform most household chores?y Are you deaf or have significant trouble hearing?n Do you feel that you have a problem with memory?n Do you feel safe at home?y Last dentist visit?1-2 times per year 8. Do you have any difficulty performing your everyday activities?n Are you having any  difficulty walking, taking medications on your own, and or difficulty managing daily home needs?n Do you have difficulty walking or climbing stairs?n Do you have difficulty dressing or bathing?n Do you have difficulty doing errands alone such as visiting a doctor's office or shopping?n Do you currently have any difficulty preparing food and eating?n Do you currently have any difficulty using the toilet?n Do you have any difficulty managing your finances?n Do you have any difficulties with housekeeping of managing your housekeeping?n   Do you have Advanced Directives in place (Living Will, Healthcare Power or Attorney)?  Has living will - but plan to have it signed by notary   Last eye Exam and location? Goes 1 x per year   Do you currently use prescribed or non-prescribed narcotic or opioid pain medications?n  Do you have a history or close family history of breast, ovarian, tubal or peritoneal cancer or a family member with BRCA (breast cancer susceptibility 1 and 2) gene mutations?     ----------------------------------------------------------------------------------------------------------------------------------------------------------------------------------------------------------------------  Because this visit was a virtual/telehealth visit, some criteria may be missing or patient reported. Any vitals not documented were not able to be obtained and vitals that have been documented are patient reported.    MEDICARE ANNUAL PREVENTIVE VISIT WITH PROVIDER: (Welcome to Medicare, initial annual wellness or annual wellness exam)  Virtual Visit via Phone Note  I connected with Cynthia Estrada on 06/24/23 by phone  application and verified that I am speaking with the correct person using two identifiers. She was not able to get the video visit connected.   Location patient: home Location provider:work  or home office Persons participating in the virtual visit: patient,  provider  Concerns and/or follow up today: reports is doing ok. Did see [facility] recently for knee arthritis - is doing better.    See HM section in Epic for other details of completed HM.    ROS: negative for report of fevers, unintentional weight loss, vision changes, vision loss, hearing loss or change, chest pain, sob, hemoptysis, melena, hematochezia, hematuria, falls, bleeding or bruising, thoughts of suicide or self harm, memory loss  Patient-completed extensive health risk assessment - reviewed and discussed with the patient: See Health Risk Assessment completed with patient prior to the visit either above or in recent phone note. This was reviewed in detailed with the patient today and appropriate recommendations, orders and referrals were placed as needed per Summary below and patient instructions.   Review of Medical History: -PMH, PSH, Family History and current specialty and care providers reviewed and updated and listed below   Patient Care Team: Panosh, Joaquim Muir, MD as PCP - General Lucendia Rusk, MD as PCP - Cardiology (Cardiology) Kenney Peacemaker, MD (Gastroenterology) Woodrow Hazy, MD as Referring Physician (Dermatology) Loyde Rule, MD (Cardiology) Alvester Aw, MD (Rheumatology) Lucendia Rusk, MD as Consulting Physician (Cardiology) Carnell Christian, [facility] (Pharmacist)   Past Medical History:  Diagnosis Date   ANXIETY 11/11/2006   Arthritis    BACK PAIN, LUMBAR 07/11/2008   Cataract    right eye removed    Chest pain 06/12/2010   Ok stress echo has Gi sx also cw gerd.    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   Retinal  detachment    SYNCOPE, HX OF 02/10/2007   UNSPECIFIED ARTHROPATHY SITE UNSPECIFIED 01/12/2007   Unspecified vitamin D  deficiency 02/10/2007    Past Surgical History:  Procedure Laterality Date   ABDOMINAL HYSTERECTOMY     age 37 for fibroids   CATARACT EXTRACTION Right    done with retinal detachment repair    COLON SURGERY  08/2009   COLONOSCOPY  07/01/2009   Two tubulovillous adenoms 7 and 4 cm size   COLONOSCOPY W/ POLYPECTOMY  07/11/2010   two 7-8 mm polyps removed - adenomas   hemicollectomy  08/2009   Right for tubulovillous adenomas (2)   RETINAL DETACHMENT SURGERY      Social History   Socioeconomic History   Marital status: Single    Spouse name: Not on file   Number of children: Not on file   Years of education: Not on file   Highest education level: Not on file  Occupational History   Occupation: Retire    Comment: [employer]  Tobacco Use   Smoking status: Never   Smokeless tobacco: Never  Vaping Use   Vaping status: Never Used  Substance and Sexual Activity   Alcohol use: No   Drug use: No   Sexual activity: Not on file  Other Topics Concern   Not on file  Social History Narrative   HH of 1    No pets   Retired from [employer] walking  onlylimited by knee oa changes  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    Social Drivers of Corporate investment banker Strain: Low Risk  (04/17/2022)   Overall Financial Resource Strain (CARDIA)    Difficulty of Paying Living Expenses: Not hard at all  Food Insecurity: No Food Insecurity (07/10/2022)   Hunger Vital Sign    Worried About Running Out of Food in the Last Year: Never true    Ran Out of Food in the Last Year: Never true  Transportation Needs: No Transportation Needs (04/17/2022)   PRAPARE - Administrator, Civil Service (Medical): No    Lack of Transportation (Non-Medical): No  Physical Activity: Insufficiently Active  (01/21/2023)   Exercise Vital Sign    Days of Exercise per Week: 2 days    Minutes of Exercise per Session: 10 min  Stress: No Stress Concern Present (01/21/2023)   [employer] of Occupational Health - Occupational Stress Questionnaire    Feeling of Stress : Not at all  Social Connections: Moderately Integrated (04/17/2022)   Social Connection and Isolation Panel [NHANES]    Frequency of Communication with Friends and Family: More than three times a week    Frequency of Social Gatherings with Friends and Family: More than three times a week    Attends Religious Services: More than 4 times per year    Active Member of [employer] or Organizations: Yes    Attends Engineer, structural: More than 4 times per year    Marital Status: Never married  Intimate Partner Violence: Not At Risk (04/17/2022)   Humiliation, Afraid, Rape, and Kick questionnaire    Fear of Current or Ex-Partner: No    Emotionally Abused: No    Physically Abused: No    Sexually Abused: No    Family History  Problem Relation Age of Onset   Hypertension Mother    Diabetes Mother    Hypertension Sister    Hypertension Brother    Hyperlipidemia Brother    Colon cancer Neg Hx    Pancreatic cancer Neg Hx    Rectal cancer Neg Hx    Stomach cancer Neg Hx    Colon polyps Neg Hx    Breast cancer Neg Hx    Esophageal cancer Neg Hx    BRCA 1/2 Neg Hx     Current Outpatient Medications on File Prior to Visit  Medication Sig Dispense Refill   Ascorbic Acid (VITAMIN C) 500 MG tablet Take 500 mg by mouth daily.     BOOSTRIX 5-2.5-18.5 LF-MCG/0.5 injection      cholecalciferol (VITAMIN D3) 25 MCG (1000 UNIT) tablet Take 1,000 Units by mouth daily.     cyanocobalamin 100 MCG tablet Take 100 mcg by mouth as needed.     ezetimibe  (ZETIA ) 10 MG tablet Take 1 tablet (10 mg total) by mouth daily. 90 tablet 1   FLUZONE HIGH-DOSE 0.5 ML injection      furosemide  (LASIX ) 40 MG tablet Take 1 tablet (40 mg total) by mouth  daily. 90 tablet 2   losartan  (COZAAR ) 50 MG tablet TAKE 1 & 1/2 (ONE & ONE-HALF) TABLETS BY MOUTH ONCE DAILY 135 tablet 0   pantoprazole  (PROTONIX ) 40 MG tablet TAKE 1 TABLET BY MOUTH ONCE DAILY (REPLACES  DEXILANT ) 30 tablet 6   rosuvastatin  (CRESTOR ) 5 MG tablet Take 1 tablet (5 mg total) by mouth daily. 90 tablet 0   ULORIC 40 MG tablet Take 40 mg by  mouth daily.      Current Facility-Administered Medications on File Prior to Visit  Medication Dose Route Frequency Provider Last Rate Last Admin   technetium tetrofosmin  (TC-MYOVIEW ) injection 10.1 millicurie  10.1 millicurie Intravenous Once PRN Elmyra Haggard, MD        Allergies  Allergen Reactions   Simvastatin Other (See Comments)    Myalgia  Slight elevation cpk 2013   Atorvastatin Cough    myalgia   Lisinopril Cough    REACTION: cough   Pravastatin  Other (See Comments)    Muscle aches        Physical Exam Vitals requested from patient and listed below if patient had equipment and was able to obtain at home for this virtual visit: There were no vitals filed for this visit. Estimated body mass index is 39.77 kg/m as calculated from the following:   Height as of 01/21/23: 5\' 5"  (1.651 m).   Weight as of 01/21/23: 239 lb (108.4 kg).  EKG (optional): deferred due to virtual visit  GENERAL: alert, oriented, no acute distress detected, full vision exam deferred due to pandemic and/or virtual encounter  PSYCH/NEURO: pleasant and cooperative, no obvious depression or anxiety, speech and thought processing grossly intact, Cognitive function grossly intact  Flowsheet Row Office Visit from 01/21/2023 in [facility] [place]  PHQ-9 Total Score 0           06/24/2023    1:11 PM 01/21/2023   11:45 AM 05/08/2022   12:02 PM 04/17/2022    1:19 PM 01/16/2022   11:17 AM  Depression screen PHQ 2/9  Decreased Interest 0 0 0 0 0  Down, Depressed, Hopeless 0 0 0 0 0  PHQ - 2 Score 0 0 0 0 0  Altered  sleeping  0 0  0  Tired, decreased energy  0 0  3  Change in appetite  0 0  0  Feeling bad or failure about yourself   0 0  0  Trouble concentrating  0 0  0  Moving slowly or fidgety/restless  0 0  0  Suicidal thoughts  0 0  0  PHQ-9 Score  0 0  3  Difficult doing work/chores   Not difficult at all  Very difficult       01/16/2022   11:17 AM 04/17/2022    1:21 PM 05/08/2022   12:02 PM 01/21/2023   11:45 AM 06/24/2023    1:11 PM  Fall Risk  Falls in the past year? 0 0 0 0 0  Was there an injury with Fall? 0 0 0 0 0  Fall Risk Category Calculator 0 0 0 0 0  Fall Risk Category (Retired) Low      (RETIRED) Patient Fall Risk Level Low fall risk      Patient at Risk for Falls Due to No Fall Risks No Fall Risks No Fall Risks No Fall Risks   Fall risk Follow up Falls evaluation completed Falls prevention discussed Falls evaluation completed Falls evaluation completed      SUMMARY AND PLAN:  Encounter for Medicare annual wellness exam   Discussed applicable health maintenance/preventive health measures and advised and referred or ordered per patient preferences: -discussed covid vaccine recs/risks, advised can get at the pharmacy and to let us  know if she does so that we can update her record.  Health Maintenance  Topic Date Due   COVID-19 Vaccine (4 - 2024-25 season) 10/27/2022   Zoster Vaccines- Shingrix (1 of 2)  07/21/2023 (Originally 03/11/1964)   INFLUENZA VACCINE  09/26/2023   MAMMOGRAM  01/27/2024   Medicare Annual Wellness (AWV)  06/23/2024   DTaP/Tdap/Td (3 - Td or Tdap) 11/14/2032   Pneumonia Vaccine 7+ Years old  Completed   DEXA SCAN  Completed   Hepatitis C Screening  Completed   HPV VACCINES  Aged Out   Meningococcal B Vaccine  Aged Out   Colonoscopy  Discontinued      Education and counseling on the following was provided based on the above review of health and a plan/checklist for the patient, along with additional information discussed, was provided for the  patient in the patient instructions :  -Advised on importance of completing advanced directives, discussed options for completing and provided information in patient instructions as well -Provided  safe balance exercises that can be done at home to improve balance and discussed exercise guidelines for adults with include balance exercises at least 3 days per week.  -Advised and counseled on a healthy lifestyle - including the importance of a healthy diet, regular physical activity, social connections and stress management. -Reviewed patient's current diet. Advised and counseled on a whole foods based healthy diet. A summary of a healthy diet was provided in the Patient Instructions.  -reviewed patient's current physical activity level and discussed exercise guidelines for adults. Discussed community resources and ideas for safe exercise at home to assist in meeting exercise guideline recommendations in a safe and healthy way.  -Advise yearly dental visits at minimum and regular eye exams   Follow up: see patient instructions     Patient Instructions  I really enjoyed getting to talk with you today! I am available on Tuesdays and Thursdays for virtual visits if you have any questions or concerns, or if I can be of any further assistance.   CHECKLIST FROM ANNUAL WELLNESS VISIT:  -Follow up (please call to schedule if not scheduled after visit):   -yearly for annual wellness visit with primary care office  Here is a list of your preventive care/health maintenance measures and the plan for each if any are due:  PLAN For any measures below that may be due:   Health Maintenance  Topic Date Due   COVID-19 Vaccine (4 - 2024-25 season) 10/27/2022   Zoster Vaccines- Shingrix (1 of 2) 07/21/2023 (Originally 03/11/1964)   INFLUENZA VACCINE  09/26/2023   MAMMOGRAM  01/27/2024   Medicare Annual Wellness (AWV)  06/23/2024   DTaP/Tdap/Td (3 - Td or Tdap) 11/14/2032   Pneumonia Vaccine 2+ Years old   Completed   DEXA SCAN  Completed   Hepatitis C Screening  Completed   HPV VACCINES  Aged Out   Meningococcal B Vaccine  Aged Out   Colonoscopy  Discontinued    -See a dentist at least yearly  -Get your eyes checked and then per your eye specialist's recommendations  -Other issues addressed today:   -I have included below further information regarding a healthy whole foods based diet, physical activity guidelines for adults, stress management and opportunities for social connections. I hope you find this information useful.   -----------------------------------------------------------------------------------------------------------------------------------------------------------------------------------------------------------------------------------------------------------    NUTRITION: -eat real food: lots of colorful vegetables (half the plate) and fruits -5-7 servings of vegetables and fruits per day (fresh or steamed is best), exp. 2 servings of vegetables with lunch and dinner and 2 servings of fruit per day. Berries and greens such as kale and collards are great choices.  -consume on a regular basis:  fresh fruits, fresh veggies,  fish, nuts, seeds, healthy oils (such as olive oil, avocado oil), whole grains (make sure for bread/pasta/crackers/etc., that the first ingredient on label contains the word "whole"), legumes. -can eat small amounts of dairy and lean meat (no larger than the palm of your hand), but avoid processed meats such as ham, bacon, lunch meat, etc. -drink water -try to avoid fast food and pre-packaged foods, processed meat, ultra processed foods/beverages (donuts, candy, etc.) -most experts advise limiting sodium to < 2300mg  per day, should limit further is any chronic conditions such as high blood pressure, heart disease, diabetes, etc. The American Heart Association advised that < 1500mg  is is ideal -try to avoid foods/beverages that contain any ingredients with  names you do not recognize  -try to avoid foods/beverages  with added sugar or sweeteners/sweets  -try to avoid sweet drinks (including diet drinks): soda, juice, Gatorade, sweet tea, power drinks, diet drinks -try to avoid white rice, white bread, pasta (unless whole grain)  EXERCISE GUIDELINES FOR ADULTS: -if you wish to increase your physical activity, do so gradually and with the approval of your doctor -STOP and seek medical care immediately if you have any chest pain, chest discomfort or trouble breathing when starting or increasing exercise  -move and stretch your body, legs, feet and arms when sitting for long periods -Physical activity guidelines for optimal health in adults: -get at least 150 minutes per week of moderate exercise (can talk, but not sing); this is about 20-30 minutes of sustained activity 5-7 days per week or two 10-15 minute episodes of sustained activity 5-7 days per week -do some muscle building/resistance training/strength training at least 2 days per week  -balance exercises 3+ days per week:   Stand somewhere where you have something sturdy to hold onto if you lose balance    1) lift up on toes, then back down, start with 5x per day and work up to 20x   2) stand and lift one leg straight out to the side so that foot is a few inches of the floor, start with 5x each side and work up to 20x each side   3) stand on one foot, start with 5 seconds each side and work up to 20 seconds on each side  If you need ideas or help with getting more active:  -Silver sneakers https://tools.silversneakers.com  -Walk with a Doc: http://www.duncan-williams.com/  -try to include resistance (weight lifting/strength building) and balance exercises twice per week: or the following link for ideas: http://castillo-powell.com/  BuyDucts.dk  STRESS MANAGEMENT: -can try meditating, or just sitting  quietly with deep breathing while intentionally relaxing all parts of your body for 5 minutes daily -if you need further help with stress, anxiety or depression please follow up with your primary doctor or contact the wonderful folks at [facility]: [phone]  SOCIAL CONNECTIONS: -options in [place] if you wish to engage in more social and exercise related activities:  -Silver sneakers https://tools.silversneakers.com  -Walk with a Doc: http://www.duncan-williams.com/  -Check out the [facility] Active Adults 50+ section on the [place] [employer] (hiking clubs, book clubs, cards and games, chess, exercise classes, aquatic classes and much more) - see the website for details: https://www.[place].gov/departments/parks-recreation/active-adults50  -YouTube has lots of exercise videos for different ages and abilities as well  -[facility] (a variety of indoor and outdoor inperson activities for adults). 985-612-0501. [address]  -Virtual Online Classes (a variety of topics): see seniorplanet.org or call 862-030-3109  -consider volunteering at a school,  hospice center, church, senior center or elsewhere            Maurie Southern, DO

## 2023-06-24 NOTE — Patient Instructions (Signed)
 I really enjoyed getting to talk with you today! I am available on Tuesdays and Thursdays for virtual visits if you have any questions or concerns, or if I can be of any further assistance.   CHECKLIST FROM ANNUAL WELLNESS VISIT:  -Follow up (please call to schedule if not scheduled after visit):   -yearly for annual wellness visit with primary care office  Here is a list of your preventive care/health maintenance measures and the plan for each if any are due:  PLAN For any measures below that may be due:   Health Maintenance  Topic Date Due   COVID-19 Vaccine (4 - 2024-25 season) 10/27/2022   Zoster Vaccines- Shingrix (1 of 2) 07/21/2023 (Originally 03/11/1964)   INFLUENZA VACCINE  09/26/2023   MAMMOGRAM  01/27/2024   Medicare Annual Wellness (AWV)  06/23/2024   DTaP/Tdap/Td (3 - Td or Tdap) 11/14/2032   Pneumonia Vaccine 12+ Years old  Completed   DEXA SCAN  Completed   Hepatitis C Screening  Completed   HPV VACCINES  Aged Out   Meningococcal B Vaccine  Aged Out   Colonoscopy  Discontinued    -See a dentist at least yearly  -Get your eyes checked and then per your eye specialist's recommendations  -Other issues addressed today:   -I have included below further information regarding a healthy whole foods based diet, physical activity guidelines for adults, stress management and opportunities for social connections. I hope you find this information useful.   -----------------------------------------------------------------------------------------------------------------------------------------------------------------------------------------------------------------------------------------------------------    NUTRITION: -eat real food: lots of colorful vegetables (half the plate) and fruits -5-7 servings of vegetables and fruits per day (fresh or steamed is best), exp. 2 servings of vegetables with lunch and dinner and 2 servings of fruit per day. Berries and greens such as  kale and collards are great choices.  -consume on a regular basis:  fresh fruits, fresh veggies, fish, nuts, seeds, healthy oils (such as olive oil, avocado oil), whole grains (make sure for bread/pasta/crackers/etc., that the first ingredient on label contains the word "whole"), legumes. -can eat small amounts of dairy and lean meat (no larger than the palm of your hand), but avoid processed meats such as ham, bacon, lunch meat, etc. -drink water -try to avoid fast food and pre-packaged foods, processed meat, ultra processed foods/beverages (donuts, candy, etc.) -most experts advise limiting sodium to < 2300mg  per day, should limit further is any chronic conditions such as high blood pressure, heart disease, diabetes, etc. The American Heart Association advised that < 1500mg  is is ideal -try to avoid foods/beverages that contain any ingredients with names you do not recognize  -try to avoid foods/beverages  with added sugar or sweeteners/sweets  -try to avoid sweet drinks (including diet drinks): soda, juice, Gatorade, sweet tea, power drinks, diet drinks -try to avoid white rice, white bread, pasta (unless whole grain)  EXERCISE GUIDELINES FOR ADULTS: -if you wish to increase your physical activity, do so gradually and with the approval of your doctor -STOP and seek medical care immediately if you have any chest pain, chest discomfort or trouble breathing when starting or increasing exercise  -move and stretch your body, legs, feet and arms when sitting for long periods -Physical activity guidelines for optimal health in adults: -get at least 150 minutes per week of moderate exercise (can talk, but not sing); this is about 20-30 minutes of sustained activity 5-7 days per week or two 10-15 minute episodes of sustained activity 5-7 days per week -do some muscle building/resistance  training/strength training at least 2 days per week  -balance exercises 3+ days per week:   Stand somewhere where you  have something sturdy to hold onto if you lose balance    1) lift up on toes, then back down, start with 5x per day and work up to 20x   2) stand and lift one leg straight out to the side so that foot is a few inches of the floor, start with 5x each side and work up to 20x each side   3) stand on one foot, start with 5 seconds each side and work up to 20 seconds on each side  If you need ideas or help with getting more active:  -Silver sneakers https://tools.silversneakers.com  -Walk with a Doc: http://www.duncan-williams.com/  -try to include resistance (weight lifting/strength building) and balance exercises twice per week: or the following link for ideas: http://castillo-powell.com/  BuyDucts.dk  STRESS MANAGEMENT: -can try meditating, or just sitting quietly with deep breathing while intentionally relaxing all parts of your body for 5 minutes daily -if you need further help with stress, anxiety or depression please follow up with your primary doctor or contact the wonderful folks at [facility]: [phone]  SOCIAL CONNECTIONS: -options in [place] if you wish to engage in more social and exercise related activities:  -Silver sneakers https://tools.silversneakers.com  -Walk with a Doc: http://www.duncan-williams.com/  -Check out the [facility] Active Adults 50+ section on the [place] [employer] (hiking clubs, book clubs, cards and games, chess, exercise classes, aquatic classes and much more) - see the website for details: https://www.[place].gov/departments/parks-recreation/active-adults50  -YouTube has lots of exercise videos for different ages and abilities as well  -[facility] (a variety of indoor and outdoor inperson activities for adults). 9721431645. [address]  -Virtual Online Classes (a variety of topics): see seniorplanet.org or  call [phone]  -consider volunteering at a school, hospice center, church, senior center or elsewhere

## 2023-06-27 ENCOUNTER — Other Ambulatory Visit: Payer: Self-pay | Admitting: Family

## 2023-07-01 ENCOUNTER — Telehealth: Payer: Self-pay

## 2023-07-01 ENCOUNTER — Other Ambulatory Visit: Payer: Self-pay

## 2023-07-01 MED ORDER — LOSARTAN POTASSIUM 50 MG PO TABS: ORAL_TABLET | ORAL | 0 refills | Status: DC

## 2023-07-01 MED ORDER — ROSUVASTATIN CALCIUM 5 MG PO TABS: 5.0000 mg | ORAL_TABLET | Freq: Every day | ORAL | 0 refills | Status: DC

## 2023-07-01 MED ORDER — PANTOPRAZOLE SODIUM 40 MG PO TBEC: DELAYED_RELEASE_TABLET | ORAL | 6 refills | Status: DC

## 2023-07-01 NOTE — Telephone Encounter (Signed)
 Spoke to pt. Rx is sent. Pt verbalized understanding.

## 2023-07-01 NOTE — Telephone Encounter (Signed)
 Copied from CRM [id number]. Topic: Clinical - Prescription Issue >> Jul 01, 2023  9:45 AM Clyde Darling P wrote: Reason for CRM: Pt is currently out of losartan  (COZAAR ) 50 MG tablet as she took her last pills today and would need it refilled

## 2023-08-21 ENCOUNTER — Other Ambulatory Visit: Payer: Self-pay | Admitting: Podiatry

## 2023-08-25 ENCOUNTER — Telehealth: Payer: Self-pay | Admitting: Diagnostic Neuroimaging

## 2023-08-25 NOTE — Telephone Encounter (Signed)
 LVM and sent text msg informing pt of need to reschedule 09/01/23 appt - MD out

## 2023-09-01 ENCOUNTER — Ambulatory Visit: Admitting: Diagnostic Neuroimaging

## 2023-09-24 ENCOUNTER — Other Ambulatory Visit: Payer: Self-pay | Admitting: Internal Medicine

## 2023-10-20 ENCOUNTER — Other Ambulatory Visit: Payer: Self-pay | Admitting: Podiatry

## 2023-10-29 ENCOUNTER — Telehealth: Payer: Self-pay | Admitting: Internal Medicine

## 2023-10-29 NOTE — Telephone Encounter (Signed)
 Handicap Parking Placard to be filled out--placed in provider's folder.  Please mail form to patient once it is complete.  Address on form is correct address to be mailed out to--verified with patient.

## 2023-10-30 NOTE — Telephone Encounter (Signed)
 Form completed.

## 2023-10-30 NOTE — Telephone Encounter (Signed)
 Spoke to pt. Inform pt form is complete and will drop it off to be mail out. Pt verbalized understanding.

## 2023-11-05 ENCOUNTER — Telehealth: Payer: Self-pay | Admitting: Internal Medicine

## 2023-11-05 MED ORDER — EZETIMIBE 10 MG PO TABS: 10.0000 mg | ORAL_TABLET | Freq: Every day | ORAL | 1 refills | Status: AC

## 2023-11-05 NOTE — Telephone Encounter (Signed)
 Copied from CRM [id number]. Topic: Clinical - Medication Refill >> Nov 05, 2023 11:29 AM Macario HERO wrote: Medication: ezetimibe  (ZETIA ) 10 MG tablet [[id number]]  Has the patient contacted their pharmacy? Yes (Agent: If no, request that the patient contact the pharmacy for the refill. If patient does not wish to contact the pharmacy document the reason why and proceed with request.) (Agent: If yes, when and what did the pharmacy advise?)  This is the patient's preferred pharmacy:  [id number] - [place] - [address] [place] ([place]) [place] [address] Phone: [phone] Fax: [phone]  Is this the correct pharmacy for this prescription? Yes If no, delete pharmacy and type the correct one.   Has the prescription been filled recently? No  Is the patient out of the medication? Yes  Has the patient been seen for an appointment in the last year OR does the patient have an upcoming appointment? Yes  Can we respond through MyChart? No  Agent: Please be advised that Rx refills may take up to 3 business days. We ask that you follow-up with your pharmacy.

## 2023-11-05 NOTE — Telephone Encounter (Signed)
 Rx sent.

## 2023-11-05 NOTE — Telephone Encounter (Signed)
 FYI Only or Action Required?: Action required by provider: medication refill request.  Patient was last seen in primary care on 06/24/2023 by Luke Chiquita SAUNDERS, DO.  Called Nurse Triage reporting No chief complaint on file..  Symptoms began today.  Interventions attempted: Nothing.  Symptoms are: stable.  Triage Disposition: No disposition on file.  Patient/caregiver understands and will follow disposition?:

## 2023-11-10 ENCOUNTER — Other Ambulatory Visit: Payer: 59

## 2023-11-14 ENCOUNTER — Other Ambulatory Visit: Payer: Self-pay | Admitting: Internal Medicine

## 2023-12-08 ENCOUNTER — Other Ambulatory Visit: Payer: Self-pay | Admitting: Podiatry

## 2023-12-19 ENCOUNTER — Other Ambulatory Visit: Payer: Self-pay | Admitting: Interventional Cardiology

## 2023-12-22 ENCOUNTER — Other Ambulatory Visit: Payer: Self-pay | Admitting: Internal Medicine

## 2023-12-24 ENCOUNTER — Telehealth: Payer: Self-pay | Admitting: *Deleted

## 2023-12-24 ENCOUNTER — Other Ambulatory Visit: Payer: Self-pay | Admitting: Internal Medicine

## 2023-12-24 DIAGNOSIS — Z1231 Encounter for screening mammogram for malignant neoplasm of breast: Secondary | ICD-10-CM

## 2023-12-24 NOTE — Telephone Encounter (Signed)
 Copied from CRM [id number]. Topic: Clinical - Medication Question >> Dec 24, 2023 10:17 AM Burnard DEL wrote: Reason for CRM: Patient called in to check on the status of refill for  losartan  (COZAAR ) 50 MG tablet. Patient took her last pill today. Request was sent on 12/22/2023.

## 2023-12-24 NOTE — Telephone Encounter (Signed)
 Rx was sent in today

## 2023-12-25 NOTE — Telephone Encounter (Signed)
 Follow up with pt. Pt states she has pick it up. No further question is needed.

## 2024-01-07 NOTE — Progress Notes (Signed)
 Cardiology Office Note:   Date:  01/07/2024  ID:  GLENDIA OLSHEFSKI, DOB Feb 02, 1946, MRN [id number] PCP: Charlett Apolinar POUR, MD  [facility] Providers Cardiologist:  Georganna Archer, MD { Chief Complaint:  Chief Complaint  Patient presents with   Shortness of Breath       History of Present Illness:   ARPI Cynthia Estrada is a 78 y.o. female with a PMH of HTN, HLD, CKD, morbid obesity, lower extremity edema who presents for follow up.  Patient presents today for follow-up.  She reports DOE but otherwise feels fine.  She states that this has been going on for a long time and that she feels limited in her ability to do physical activity due to shortness of breath.  Denies chest pain, PND, orthopnea, swelling, palpitations, syncope or presyncope.  She is taking all of her medications as prescribed without side effect.   Past Medical History:  Diagnosis Date   ANXIETY 11/11/2006   Arthritis    BACK PAIN, LUMBAR 07/11/2008   Cataract    right eye removed    Chest pain 06/12/2010   Ok stress echo has Gi sx also cw gerd.    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   Retinal detachment    SYNCOPE, HX OF 02/10/2007   UNSPECIFIED ARTHROPATHY SITE UNSPECIFIED 01/12/2007   Unspecified vitamin D  deficiency 02/10/2007     Studies Reviewed:    EKG:  EKG Interpretation Date/Time:  Thursday January 08 2024 10:40:52 EST Ventricular Rate:  75 PR Interval:  154 QRS Duration:  82 QT Interval:  376 QTC Calculation: 419 R Axis:   5  Text Interpretation: Normal sinus rhythm Nonspecific ST and T wave abnormality When compared with ECG of 26-Jun-2019 09:24, PREVIOUS ECG IS  PRESENT Confirmed by Archer Georganna [phone]) on 01/08/2024 10:48:13 AM     Cardiac Studies & Procedures   ______________________________________________________________________________________________   STRESS TESTS  MYOCARDIAL PERFUSION IMAGING 02/12/2017  Interpretation Summary  There was no ST segment deviation noted during stress.  The study is normal.  This is a low risk study.  Normal resting and stress perfusion. No ischemia or infarction  No gated images for EF   ECHOCARDIOGRAM  ECHOCARDIOGRAM COMPLETE 07/29/2019  Narrative ECHOCARDIOGRAM REPORT    Patient Name:   Cynthia Estrada Date of Exam: 07/29/2019 Medical Rec #:  [id number]       Height:       66.0 in Accession #:    [id number]      Weight:       232.4 lb Date of Birth:  10-09-1945       BSA:          2.132 m Patient Age:    74 years        BP:           130/82 mmHg Patient Gender: F               HR:           75 bpm. Exam Location:  [address]  Procedure: 2D Echo, 3D Echo, Cardiac Doppler, Color Doppler and Strain Analysis  Indications:  R06.00 Dyspnea  History:        Patient has prior history of Echocardiogram examinations, most recent 08/13/2010. Signs/Symptoms:Syncope; Risk Factors:Hypertension and Dyslipidemia.  Sonographer:    Marshia Rea RAMAN, RDCS Referring Phys: [id number] APOLINAR POUR [facility]  IMPRESSIONS   1. Left ventricular ejection fraction, by estimation, is 60 to 65%. The left ventricle has normal function. The left ventricle has no regional wall motion abnormalities. Left ventricular diastolic parameters are consistent with Grade I diastolic dysfunction (impaired relaxation). 2. Right ventricular systolic function is normal. The right ventricular size is normal. Tricuspid regurgitation signal is inadequate for assessing PA pressure. 3. The mitral valve is grossly normal. No evidence of mitral valve regurgitation. No evidence of mitral stenosis. 4. The aortic valve is tricuspid. Aortic valve  regurgitation is not visualized. No aortic stenosis is present. 5. The inferior vena cava is normal in size with greater than 50% respiratory variability, suggesting right atrial pressure of 3 mmHg.  Comparison(s): Stress echo 08/13/10.  FINDINGS Left Ventricle: Left ventricular ejection fraction, by estimation, is 60 to 65%. The left ventricle has normal function. The left ventricle has no regional wall motion abnormalities. The left ventricular internal cavity size was normal in size. There is no left ventricular hypertrophy. Left ventricular diastolic parameters are consistent with Grade I diastolic dysfunction (impaired relaxation). Normal left ventricular filling pressure.  Right Ventricle: The right ventricular size is normal. No increase in right ventricular wall thickness. Right ventricular systolic function is normal. Tricuspid regurgitation signal is inadequate for assessing PA pressure.  Left Atrium: Left atrial size was normal in size.  Right Atrium: Right atrial size was normal in size.  Pericardium: A small pericardial effusion is present. The pericardial effusion is anterior to the right ventricle. There is no evidence of cardiac tamponade. Presence of pericardial fat pad.  Mitral Valve: The mitral valve is grossly normal. No evidence of mitral valve regurgitation. No evidence of mitral valve stenosis.  Tricuspid Valve: The tricuspid valve is grossly normal. Tricuspid valve regurgitation is trivial. No evidence of tricuspid stenosis.  Aortic Valve: The aortic valve is tricuspid. Aortic valve regurgitation is not visualized. No aortic stenosis is present.  Pulmonic Valve: The pulmonic valve was grossly normal. Pulmonic valve regurgitation is not visualized. No evidence of pulmonic stenosis.  Aorta: The aortic root and ascending aorta are structurally normal, with no evidence of dilitation.  Venous: The inferior vena cava is normal in size with greater than 50% respiratory  variability, suggesting right atrial pressure of 3 mmHg.  IAS/Shunts: The atrial septum is grossly normal.   LEFT VENTRICLE PLAX 2D LVIDd:         4.30 cm  Diastology LVIDs:         2.50 cm  LV e' lateral:   6.00 cm/s LV PW:         0.90 cm  LV E/e' lateral: 9.8 LV IVS:        1.10 cm  LV e' medial:    3.87 cm/s LVOT diam:     2.00 cm  LV E/e' medial:  15.2 LV SV:         60 LV SV Index:   28       2D Longitudinal Strain LVOT Area:     3.14 cm 2D Strain GLS (A2C):   -20.0 % 2D Strain GLS (A3C):   -26.5 % 2D Strain GLS (A4C):   -18.8 % 2D Strain GLS Avg:     -21.8 %  3D Volume EF:  3D EF:        58 % LV EDV:       99 ml LV ESV:       42 ml LV SV:        58 ml  RIGHT VENTRICLE RV Basal diam:  3.10 cm RV S prime:     22.00 cm/s TAPSE (M-mode): 2.4 cm  LEFT ATRIUM             Index       RIGHT ATRIUM           Index LA diam:        4.20 cm 1.97 cm/m  RA Pressure: 3.00 mmHg LA Vol (A2C):   31.8 ml 14.92 ml/m RA Area:     14.70 cm LA Vol (A4C):   25.9 ml 12.15 ml/m RA Volume:   28.70 ml  13.46 ml/m LA Biplane Vol: 28.6 ml 13.42 ml/m AORTIC VALVE LVOT Vmax:   100.00 cm/s LVOT Vmean:  62.400 cm/s LVOT VTI:    0.192 m  AORTA Ao Asc diam: 3.80 cm  MITRAL VALVE               TRICUSPID VALVE Estimated RAP:  3.00 mmHg  MV E velocity: 58.70 cm/s  SHUNTS MV A velocity: 81.70 cm/s  Systemic VTI:  0.19 m MV E/A ratio:  0.72        Systemic Diam: 2.00 cm  Darryle Decent MD Electronically signed by Darryle Decent MD Signature Date/Time: 07/29/2019/2:23:18 PM    Final          ______________________________________________________________________________________________      Risk Assessment/Calculations:              Physical Exam:     VS:  BP 130/78 (BP Location: Left Arm, Patient Position: Sitting)   Pulse 75   Ht 5' 6 (1.676 m)   Wt 240 lb 6.4 oz (109 kg)   SpO2 100%   BMI 38.80 kg/m      Wt Readings from Last 3 Encounters:  01/21/23 239 lb (108.4  kg)  07/24/22 241 lb 6.4 oz (109.5 kg)  05/08/22 242 lb 9.6 oz (110 kg)     GEN: Well nourished, well developed, in no acute distress NECK: No JVD; No carotid bruits CARDIAC: RRR, no murmurs, rubs, gallops RESPIRATORY:  Clear to auscultation without rales, wheezing or rhonchi  ABDOMEN: Soft, non-tender, non-distended, normal bowel sounds EXTREMITIES:  Warm and well perfused, no edema; No deformity, 2+ radial pulses PSYCH: Normal mood and affect   Assessment & Plan Essential hypertension - Blood pressure is at goal.  No changes. Continue losartan  75 milligram daily Dyspnea on exertion - Patient reports ongoing DOE. -She is certainly higher risk for having CAD given her comorbidities. -Will check a stress test and echocardiogram to evaluate. NM pharmacologic SPECT Complete echo Lower extremity edema - Stable.  No changes. Continue Lasix  40 mg daily Mixed hyperlipidemia Check lipid panel Continue Zetia  Continue Crestor  5 mg daily      Informed Consent   Shared Decision Making/Informed Consent The risks [chest pain, shortness of breath, cardiac arrhythmias, dizziness, blood pressure fluctuations, myocardial infarction, stroke/transient ischemic attack, nausea, vomiting, allergic reaction, radiation exposure, metallic taste sensation and life-threatening complications (estimated to be 1 in 10,000)], benefits (risk stratification, diagnosing coronary artery disease, treatment guidance) and alternatives of a nuclear stress test were discussed in detail with Ms. Hellenbrand and she agrees to proceed.      This note was written with  the assistance of a dictation microphone or AI dictation software. Please excuse any typos or grammatical errors.   Signed, Georganna Archer, MD 01/07/2024 9:25 PM    [facility]

## 2024-01-08 ENCOUNTER — Encounter: Payer: Self-pay | Admitting: Student in an Organized Health Care Education/Training Program

## 2024-01-08 ENCOUNTER — Ambulatory Visit
Attending: Student in an Organized Health Care Education/Training Program | Admitting: Student in an Organized Health Care Education/Training Program

## 2024-01-08 VITALS — BP 130/78 | HR 75 | Ht 66.0 in | Wt 240.4 lb

## 2024-01-08 DIAGNOSIS — I10 Essential (primary) hypertension: Secondary | ICD-10-CM | POA: Diagnosis not present

## 2024-01-08 DIAGNOSIS — R600 Localized edema: Secondary | ICD-10-CM

## 2024-01-08 DIAGNOSIS — E782 Mixed hyperlipidemia: Secondary | ICD-10-CM | POA: Diagnosis not present

## 2024-01-08 DIAGNOSIS — R0609 Other forms of dyspnea: Secondary | ICD-10-CM | POA: Diagnosis not present

## 2024-01-08 NOTE — Assessment & Plan Note (Signed)
-   Patient reports ongoing DOE. -She is certainly higher risk for having CAD given her comorbidities. -Will check a stress test and echocardiogram to evaluate. NM pharmacologic SPECT Complete echo

## 2024-01-08 NOTE — Assessment & Plan Note (Signed)
 Check lipid panel Continue Zetia  Continue Crestor  5 mg daily

## 2024-01-08 NOTE — Assessment & Plan Note (Signed)
-   Stable.  No changes. Continue Lasix  40 mg daily

## 2024-01-08 NOTE — Patient Instructions (Signed)
 Medication Instructions:  Your physician recommends that you continue on your current medications as directed. Please refer to the Current Medication list given to you today.  *If you need a refill on your cardiac medications before your next appointment, please call your pharmacy*  Lab Work: Today- Lipids, BMET, Magnesium If you have labs (blood work) drawn today and your tests are completely normal, you will receive your results only by: MyChart Message (if you have MyChart) OR A paper copy in the mail If you have any lab test that is abnormal or we need to change your treatment, we will call you to review the results.  Testing/Procedures:  You are scheduled for a Myocardial Perfusion Study. Please arrive 15 minutes prior to your appointment time for registration and insurance purposes.  The test will take approximately 3-4 hours to complete; you may bring reading material.  If someone comes with you to your appointment, they will need to remain in the main lobby due to limited space in the testing area.  If you are pregnant or breastfeeding, please notify the nuclear lab prior to your appointment.  HOW TO PREPARE FOR YOUR TEST:  DO NOT eat or drink 3 hours prior to your test, except you may have water DO NOT consume products containing caffeine (regular or decaffeinated) 12 hours prior to your test (ex: coffee, chocolate, sodas, tea.) DO bring a list of your current medications with you.  If not listed below, you may take your medications as normal. DO NOT TAKE _________________ for 24 HOURS prior to the test.  Bring the medication to your appointment as you may be required to take it once the test is complete. DO wear comfortable clothes (no dresses or overalls) and walking shoes, tennis shoes preferred (No heels or open toe shoes are allowed) DO NOT wear cologne, perfume, aftershave, or lotions (deodorant is allowed). If these instructions are not followed, your test will have to be  rescheduled.   Please report to [address] [place], [place] [address].  If you have questions or concerns about your appointment, you can call the Nuclear Lab [phone].  If you cannot keep your appointment, please provide 24 hours notification to the Nuclear Lab, to avoid a possible $50 charge to your account.     Your physician has requested that you have an echocardiogram. Echocardiography is a painless test that uses sound waves to create images of your heart. It provides your doctor with information about the size and shape of your heart and how well your heart's chambers and valves are working. This procedure takes approximately one hour. There are no restrictions for this procedure. Please do NOT wear cologne, perfume, aftershave, or lotions (deodorant is allowed). Please arrive 15 minutes prior to your appointment time.  Please note: We ask at that you not bring children with you during ultrasound (echo/ vascular) testing. Due to room size and safety concerns, children are not allowed in the ultrasound rooms during exams. Our front office staff cannot provide observation of children in our lobby area while testing is being conducted. An adult accompanying a patient to their appointment will only be allowed in the ultrasound room at the discretion of the ultrasound technician under special circumstances. We apologize for any inconvenience.   Follow-Up: At [facility], you and your health needs are our priority.  As part of our continuing mission to provide you with exceptional heart care, our providers are all part of one team.  This team includes your  primary Cardiologist (physician) and Advanced Practice Providers or APPs (Physician Assistants and Nurse Practitioners) who all work together to provide you with the care you need, when you need it.  Your next appointment:   1 year(s)  Provider:   Georganna Archer, MD

## 2024-01-08 NOTE — Assessment & Plan Note (Signed)
-   Blood pressure is at goal.  No changes. Continue losartan  75 milligram daily

## 2024-01-09 ENCOUNTER — Telehealth (HOSPITAL_COMMUNITY): Payer: Self-pay | Admitting: *Deleted

## 2024-01-09 NOTE — Telephone Encounter (Signed)
 [facility] for instructions for upcoming stress test on 01/16/24

## 2024-01-09 NOTE — Telephone Encounter (Signed)
 Close encounter

## 2024-01-12 ENCOUNTER — Other Ambulatory Visit: Payer: Self-pay | Admitting: Student in an Organized Health Care Education/Training Program

## 2024-01-12 DIAGNOSIS — R0609 Other forms of dyspnea: Secondary | ICD-10-CM

## 2024-01-15 LAB — LIPID PANEL

## 2024-01-16 ENCOUNTER — Ambulatory Visit: Payer: Self-pay | Admitting: Student in an Organized Health Care Education/Training Program

## 2024-01-16 ENCOUNTER — Ambulatory Visit (HOSPITAL_COMMUNITY)
Admission: RE | Admit: 2024-01-16 | Discharge: 2024-01-16 | Disposition: A | Discharge status: Home / Self Care | Source: Ambulatory Visit | Attending: Student in an Organized Health Care Education/Training Program | Admitting: Student in an Organized Health Care Education/Training Program

## 2024-01-16 DIAGNOSIS — R0609 Other forms of dyspnea: Secondary | ICD-10-CM | POA: Insufficient documentation

## 2024-01-16 LAB — MYOCARDIAL PERFUSION IMAGING
LV dias vol: 71 mL (ref 46–106)
LV sys vol: 18 mL (ref 3.8–5.2)
Nuc Stress EF: 76 %
Peak HR: 77 {beats}/min
Rest HR: 43 {beats}/min
Rest Nuclear Isotope Dose: 10.8 mCi
SDS: 0
SRS: 3
SSS: 0
ST Depression (mm): 0 mm
Stress Nuclear Isotope Dose: 30.4 mCi
TID: 0.86

## 2024-01-16 LAB — LIPID PANEL
Chol/HDL Ratio: 2.3 ratio (ref 0.0–4.4)
Cholesterol, Total: 167 mg/dL (ref 100–199)
HDL: 74 mg/dL (ref >39)
LDL Chol Calc (NIH): 69 mg/dL (ref 0–99)
Triglycerides: 145 mg/dL (ref 0–149)
VLDL Cholesterol Cal: 24 mg/dL (ref 5–40)

## 2024-01-16 LAB — MAGNESIUM: Magnesium: 1.7 mg/dL (ref 1.6–2.3)

## 2024-01-16 LAB — BASIC METABOLIC PANEL WITH GFR
BUN/Creatinine Ratio: 6 — ABNORMAL LOW (ref 12–28)
BUN: 7 mg/dL — ABNORMAL LOW (ref 8–27)
CO2: 23 mmol/L (ref 20–29)
Calcium: 10.2 mg/dL (ref 8.7–10.3)
Chloride: 101 mmol/L (ref 96–106)
Creatinine, Ser: 1.22 mg/dL — ABNORMAL HIGH (ref 0.57–1.00)
Glucose: 84 mg/dL (ref 70–99)
Potassium: 3.7 mmol/L (ref 3.5–5.2)
Sodium: 140 mmol/L (ref 134–144)
eGFR: 45 mL/min/1.73 — ABNORMAL LOW (ref >59)

## 2024-01-16 MED ORDER — REGADENOSON 0.4 MG/5ML IV SOLN
0.4000 mg | Freq: Once | INTRAVENOUS | Status: AC
  Administered 2024-01-16: 0.4 mg via INTRAVENOUS

## 2024-01-16 MED ORDER — REGADENOSON 0.4 MG/5ML IV SOLN
INTRAVENOUS | Status: AC
  Filled 2024-01-16: qty 5

## 2024-01-16 MED ORDER — TECHNETIUM TC 99M TETROFOSMIN IV KIT
30.4000 | PACK | Freq: Once | INTRAVENOUS | Status: AC | PRN
  Administered 2024-01-16: 30.4 via INTRAVENOUS

## 2024-01-16 MED ORDER — TECHNETIUM TC 99M TETROFOSMIN IV KIT
10.8000 | PACK | Freq: Once | INTRAVENOUS | Status: AC | PRN
  Administered 2024-01-16: 10.8 via INTRAVENOUS

## 2024-01-16 NOTE — Progress Notes (Signed)
Sent results to patient

## 2024-01-20 NOTE — Progress Notes (Signed)
The patient has been notified of the result and verbalized understanding.  All questions (if any) were answered.     

## 2024-01-27 ENCOUNTER — Ambulatory Visit: Payer: Medicare HMO | Admitting: Internal Medicine

## 2024-01-27 ENCOUNTER — Ambulatory Visit: Payer: Self-pay | Admitting: Internal Medicine

## 2024-01-27 ENCOUNTER — Encounter: Payer: Self-pay | Admitting: Internal Medicine

## 2024-01-27 VITALS — BP 126/82 | HR 57 | Temp 98.1°F | Ht 65.5 in | Wt 235.4 lb

## 2024-01-27 DIAGNOSIS — K219 Gastro-esophageal reflux disease without esophagitis: Secondary | ICD-10-CM | POA: Diagnosis not present

## 2024-01-27 DIAGNOSIS — Z79899 Other long term (current) drug therapy: Secondary | ICD-10-CM

## 2024-01-27 DIAGNOSIS — Z0000 Encounter for general adult medical examination without abnormal findings: Secondary | ICD-10-CM | POA: Diagnosis not present

## 2024-01-27 DIAGNOSIS — R944 Abnormal results of kidney function studies: Secondary | ICD-10-CM

## 2024-01-27 DIAGNOSIS — I10 Essential (primary) hypertension: Secondary | ICD-10-CM

## 2024-01-27 DIAGNOSIS — M2550 Pain in unspecified joint: Secondary | ICD-10-CM

## 2024-01-27 DIAGNOSIS — R739 Hyperglycemia, unspecified: Secondary | ICD-10-CM

## 2024-01-27 DIAGNOSIS — E785 Hyperlipidemia, unspecified: Secondary | ICD-10-CM

## 2024-01-27 LAB — CBC WITH DIFFERENTIAL/PLATELET
Basophils Absolute: 0 K/uL (ref 0.0–0.1)
Basophils Relative: 0.7 % (ref 0.0–3.0)
Eosinophils Absolute: 0.1 K/uL (ref 0.0–0.7)
Eosinophils Relative: 1.6 % (ref 0.0–5.0)
HCT: 40.4 % (ref 36.0–46.0)
Hemoglobin: 13.3 g/dL (ref 12.0–15.0)
Lymphocytes Relative: 36 % (ref 12.0–46.0)
Lymphs Abs: 1.6 K/uL (ref 0.7–4.0)
MCHC: 32.9 g/dL (ref 30.0–36.0)
MCV: 81.7 fl (ref 78.0–100.0)
Monocytes Absolute: 0.7 K/uL (ref 0.1–1.0)
Monocytes Relative: 16.2 % — ABNORMAL HIGH (ref 3.0–12.0)
Neutro Abs: 2.1 K/uL (ref 1.4–7.7)
Neutrophils Relative %: 45.5 % (ref 43.0–77.0)
Platelets: 181 K/uL (ref 150.0–400.0)
RBC: 4.94 Mil/uL (ref 3.87–5.11)
RDW: 15.2 % (ref 11.5–15.5)
WBC: 4.5 K/uL (ref 4.0–10.5)

## 2024-01-27 LAB — HEPATIC FUNCTION PANEL
ALT: 12 U/L (ref 0–35)
AST: 18 U/L (ref 0–37)
Albumin: 4.6 g/dL (ref 3.5–5.2)
Alkaline Phosphatase: 60 U/L (ref 39–117)
Bilirubin, Direct: 0.2 mg/dL (ref 0.0–0.3)
Total Bilirubin: 1.2 mg/dL (ref 0.2–1.2)
Total Protein: 6.9 g/dL (ref 6.0–8.3)

## 2024-01-27 LAB — MICROALBUMIN / CREATININE URINE RATIO
Creatinine,U: 90.5 mg/dL
Microalb Creat Ratio: UNDETERMINED mg/g (ref 0.0–30.0)
Microalb, Ur: <0.7 mg/dL

## 2024-01-27 LAB — TSH: TSH: 1.34 u[IU]/mL (ref 0.35–5.50)

## 2024-01-27 LAB — HEMOGLOBIN A1C: Hgb A1c MFr Bld: 6 % (ref 4.6–6.5)

## 2024-01-27 NOTE — Progress Notes (Signed)
 Tell patient  labs are normal or stable Thyroid  nl No excess protein in urine A1c is 6  prediabetic    liver test normal . Optimize diet  limit sugars   and sweets   Yearly check and as needed

## 2024-01-27 NOTE — Patient Instructions (Addendum)
 Good to see  you today. Update labs  A1c thyroid  and urine for protein. Rest of labs done  by specialists  in past 9 months.

## 2024-01-27 NOTE — Progress Notes (Signed)
 Chief Complaint  Patient presents with   Annual Exam    HPI: Patient  Cynthia Estrada  78 y.o. comes in today for Preventive Health Care visit  And Chronic disease management  HLD continue meds  had  lab from [facility]  GI protonix  still helps Cv eval for DOE  stable  Hx of gout and   inflamm arthritis  see dr Leni  lab in March  in record  gets am  joint stiffness.    Health Maintenance  Topic Date Due   Mammogram  01/27/2024   COVID-19 Vaccine (4 - 2025-26 season) 02/12/2024 (Originally 10/27/2023)   Zoster Vaccines- Shingrix (1 of 2) 04/26/2024 (Originally 03/11/1964)   Medicare Annual Wellness (AWV)  06/23/2024   DTaP/Tdap/Td (3 - Td or Tdap) 11/14/2032   Pneumococcal Vaccine: 50+ Years  Completed   Influenza Vaccine  Completed   Bone Density Scan  Completed   Hepatitis C Screening  Completed   Meningococcal B Vaccine  Aged Out   Colonoscopy  Discontinued   Health Maintenance Review LIFESTYLE:  Exercise:   as possible no fall uses cane  just goes slow  Tobacco/ETS:n Alcohol: n Sugar beverages: ocass ginger ald  Sleep: fall asleep in recliner  in day   then less at night .  Drug use: no HH of 1 no pets   Senior   To see rheum .   ROS:  GEN/ HEENT: No fever, significant weight changes sweats headaches vision problems hearing changes, CV/ PULM; No chest pain shortness of breath cough, syncope,edema  change in exercise tolerance. GI /GU: No adominal pain, vomiting, change in bowel habits. No blood in the stool. No significant GU symptoms. SKIN/HEME: ,no acute skin rashes suspicious lesions or bleeding. No lymphadenopathy, nodules, masses.  NEURO/ PSYCH:  No neurologic signs such as weakness numbness. No depression anxiety. IMM/ Allergy: No unusual infections.  Allergy .   REST of 12 system review negative except as per HPI   Past Medical History:  Diagnosis Date   ANXIETY 11/11/2006   Arthritis    BACK PAIN, LUMBAR 07/11/2008   Cataract    right eye removed     Chest pain 06/12/2010   Ok stress echo has Gi sx also cw gerd.    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   Retinal detachment    SYNCOPE, HX OF 02/10/2007   UNSPECIFIED ARTHROPATHY SITE UNSPECIFIED 01/12/2007   Unspecified vitamin D  deficiency 02/10/2007    Past Surgical History:  Procedure Laterality Date   ABDOMINAL HYSTERECTOMY     age 76 for fibroids   CATARACT EXTRACTION Right    done with retinal detachment repair    COLON SURGERY  08/2009   COLONOSCOPY  07/01/2009   Two tubulovillous adenoms 7 and 4 cm size   COLONOSCOPY W/ POLYPECTOMY  07/11/2010   two 7-8 mm polyps removed - adenomas   hemicollectomy  08/2009   Right for tubulovillous adenomas (2)   RETINAL DETACHMENT SURGERY      Family History  Problem Relation Age of Onset   Hypertension Mother    Diabetes Mother    Hypertension Sister  Hypertension Brother    Hyperlipidemia Brother    Colon cancer Neg Hx    Pancreatic cancer Neg Hx    Rectal cancer Neg Hx    Stomach cancer Neg Hx    Colon polyps Neg Hx    Breast cancer Neg Hx    Esophageal cancer Neg Hx    BRCA 1/2 Neg Hx     Social History   Socioeconomic History   Marital status: Single    Spouse name: Not on file   Number of children: Not on file   Years of education: Not on file   Highest education level: Not on file  Occupational History   Occupation: Retire    Comment: [employer]  Tobacco Use   Smoking status: Never   Smokeless tobacco: Never  Vaping Use   Vaping status: Never Used  Substance and Sexual Activity   Alcohol use: No   Drug use: No   Sexual activity: Not on file  Other Topics Concern   Not on file   Social History Narrative   HH of 1    No pets   Retired from [employer] walking  onlylimited by knee oa changes    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    Social Drivers of Corporate Investment Banker Strain: Low Risk  (04/17/2022)   Overall Financial Resource Strain (CARDIA)    Difficulty of Paying Living Expenses: Not hard at all  Food Insecurity: No Food Insecurity (07/10/2022)   Hunger Vital Sign    Worried About Running Out of Food in the Last Year: Never true    Ran Out of Food in the Last Year: Never true  Transportation Needs: No Transportation Needs (04/17/2022)   PRAPARE - Administrator, Civil Service (Medical): No    Lack of Transportation (Non-Medical): No  Physical Activity: Insufficiently Active (01/21/2023)   Exercise Vital Sign    Days of Exercise per Week: 2 days    Minutes of Exercise per Session: 10 min  Stress: No Stress Concern Present (01/21/2023)   [employer] of Occupational Health - Occupational Stress Questionnaire    Feeling of Stress : Not at all  Social Connections: Moderately Integrated (04/17/2022)   Social Connection and Isolation Panel    Frequency of Communication with Friends and Family: More than three times a week    Frequency of Social Gatherings with Friends and Family: More than three times a week    Attends Religious Services: More than 4 times per year    Active Member of [employer] or Organizations: Yes    Attends Engineer, Structural: More than 4 times per year    Marital Status: Never married    Outpatient Medications Prior to Visit  Medication Sig Dispense Refill   Ascorbic Acid (VITAMIN C) 500 MG tablet Take 500 mg by mouth daily.     cholecalciferol (VITAMIN D3) 25 MCG (1000 UNIT) tablet Take 1,000 Units by mouth daily.     cyanocobalamin 100 MCG tablet Take 100 mcg by mouth as needed.     ezetimibe  (ZETIA ) 10 MG tablet Take 1 tablet (10 mg  total) by mouth daily. 90 tablet 1   furosemide  (LASIX ) 40 MG tablet Take 1 tablet by mouth once daily 30 tablet 0   losartan  (COZAAR ) 50 MG tablet TAKE 1 & 1/2 (ONE & ONE-HALF) TABLETS BY MOUTH ONCE DAILY 135 tablet 0  pantoprazole  (PROTONIX ) 40 MG tablet TAKE 1 TABLET BY MOUTH ONCE DAILY (REPLACES DEXILANT ). 30 tablet 6   rosuvastatin  (CRESTOR ) 5 MG tablet Take 1 tablet by mouth once daily 90 tablet 0   ULORIC 40 MG tablet Take 40 mg by mouth daily.      BOOSTRIX 5-2.5-18.5 LF-MCG/0.5 injection      FLUZONE HIGH-DOSE 0.5 ML injection      gabapentin  (NEURONTIN ) 100 MG capsule Take 1 capsule by mouth at bedtime (Patient not taking: Reported on 01/27/2024) 30 capsule 0   Facility-Administered Medications Prior to Visit  Medication Dose Route Frequency Provider Last Rate Last Admin   technetium tetrofosmin  (TC-MYOVIEW ) injection 10.1 millicurie  10.1 millicurie Intravenous Once PRN Okey Vina GAILS, MD         EXAM:  BP 126/82 (BP Location: Left Arm, Patient Position: Sitting, Cuff Size: Large)   Pulse (!) 57   Temp 98.1 F (36.7 C) (Oral)   Ht 5' 5.5 (1.664 m)   Wt 235 lb 6.4 oz (106.8 kg)   SpO2 97%   BMI 38.58 kg/m   Body mass index is 38.58 kg/m. Wt Readings from Last 3 Encounters:  01/27/24 235 lb 6.4 oz (106.8 kg)  01/16/24 240 lb (108.9 kg)  01/08/24 240 lb 6.4 oz (109 kg)    Physical Exam: Vital signs reviewed HZW:Uypd is a well-developed well-nourished alert cooperative    who appearsr stated age in no acute distress.  HEENT: normocephalic atraumatic , Eyes: PERRL EOM's full, conjunctiva clear, Nares: paten,t no deformity discharge or tenderness., Ears: no deformity EAC's clear TMs with normal landmarks. Mouth: clear OP, no lesions, edema.  Moist mucous membranes. Dentition in adequate repair. NECK: supple without masses, thyromegaly or bruits. CHEST/PULM:  Clear to auscultation and percussion breath sounds equal no wheeze , rales or rhonchi.  Breast: normal by  inspection . No dimpling, discharge, masses, tenderness or discharge . CV: PMI is nondisplaced, S1 S2 no gallops, murmurs, rubs. Peripheral pulses are present without delay. ABDOMEN: Bowel sounds normal nontender  No guard or rebound, no hepato splenomegal no CVA tenderness.  Extremtities:  No clubbing cyanosis or edema, no acute joint swelling or redness no focal atrophyoa changes  NEURO:  Oriented x3, cranial nerves 3-12 appear to be intact, no obvious focal weakness,gait within normal limits no abnormal reflexes or asymmetrical ambulatory with cane  SKIN: No acute rashes normal turgor, color, no bruising or petechiae. PSYCH: Oriented, good eye contact, no obvious depression anxiety, cognition and judgment appear normal. LN: no cervical axillary iadenopathy  Lab Results  Component Value Date   WBC 4.5 01/27/2024   HGB 13.3 01/27/2024   HCT 40.4 01/27/2024   PLT 181.0 01/27/2024   GLUCOSE 84 01/15/2024   CHOL 167 01/15/2024   TRIG 145 01/15/2024   HDL 74 01/15/2024   LDLDIRECT 171.0 12/20/2015   LDLCALC 69 01/15/2024   ALT 12 01/27/2024   AST 18 01/27/2024   NA 140 01/15/2024   K 3.7 01/15/2024   CL 101 01/15/2024   CREATININE 1.22 (H) 01/15/2024   BUN 7 (L) 01/15/2024   CO2 23 01/15/2024   TSH 1.34 01/27/2024   HGBA1C 6.0 01/27/2024   MICROALBUR <0.7 01/27/2024    BP Readings from Last 3 Encounters:  01/27/24 126/82  01/08/24 130/78  04/01/23 (!) 154/82    Lab results reviewed with patient  had lipid mag and bmp  Had panel cmp cbc in march 25 at [facility]  xr 1.26 Uric acid 8.1 ASSESSMENT  AND PLAN:  Discussed the following assessment and plan:    ICD-10-CM   1. Visit for preventive health examination  Z00.00     2. Medication management  Z79.899 CBC with Differential/Platelet    TSH    Hemoglobin A1c    Hepatic function panel    Microalbumin / creatinine urine ratio    3. Gastroesophageal reflux disease, unspecified whether esophagitis present  K21.9  CBC with Differential/Platelet    TSH    Hemoglobin A1c    Hepatic function panel    4. Hyperglycemia  R73.9 CBC with Differential/Platelet    TSH    Hemoglobin A1c    Hepatic function panel    Microalbumin / creatinine urine ratio    5. Hyperlipidemia, unspecified hyperlipidemia type  E78.5 CBC with Differential/Platelet    TSH    Hemoglobin A1c    Hepatic function panel    6. Multiple joint pain  M25.50 CBC with Differential/Platelet    TSH    Hemoglobin A1c    Hepatic function panel    7. Essential hypertension  I10 CBC with Differential/Platelet    TSH    Hemoglobin A1c    Hepatic function panel    Microalbumin / creatinine urine ratio    8. Decreased calculated glomerular filtration rate (GFR)  R94.4 Microalbumin / creatinine urine ratio    9. Morbid obesity (HCC)  E66.01    ht  hld oa    Last gfr dec but  stable    add urine protein screen  Utd on flu and pneu Continue  specialty care  as above  Healhty weight control may help her conditions   Return in about 1 year (around 01/26/2025) for depending on results.  Patient Care Team: Lakesia Dahle, Apolinar POUR, MD as PCP - General Floretta Mallard, MD as PCP - Cardiology (Cardiology) Avram Lupita BRAVO, MD (Gastroenterology) Sheldon Lowes, MD as Referring Physician (Dermatology) Delford Maude BROCKS, MD (Cardiology) Leni Marjory MATSU, MD (Rheumatology) Dann Candyce RAMAN, MD as Consulting Physician (Cardiology) Lionell Jon DEL, [facility] (Pharmacist) Patient Instructions  Good to see  you today. Update labs  A1c thyroid  and urine for protein. Rest of labs done  by specialists  in past 9 months.    Jahmal Dunavant K. Stepheni Cameron M.D.

## 2024-01-28 ENCOUNTER — Ambulatory Visit
Admission: RE | Admit: 2024-01-28 | Discharge: 2024-01-28 | Disposition: A | Source: Ambulatory Visit | Attending: Internal Medicine | Admitting: Internal Medicine

## 2024-01-28 DIAGNOSIS — Z1231 Encounter for screening mammogram for malignant neoplasm of breast: Secondary | ICD-10-CM

## 2024-02-05 ENCOUNTER — Telehealth: Payer: Self-pay | Admitting: *Deleted

## 2024-02-05 NOTE — Telephone Encounter (Signed)
 noted

## 2024-02-05 NOTE — Telephone Encounter (Signed)
 Copied from CRM #[id number]. Topic: Clinical - Lab/Test Results >> Feb 05, 2024  8:46 AM Avram MATSU wrote: Reason for CRM: I relayed test results to pt

## 2024-02-07 ENCOUNTER — Other Ambulatory Visit: Payer: Self-pay | Admitting: Cardiology

## 2024-02-16 ENCOUNTER — Ambulatory Visit (HOSPITAL_COMMUNITY)
Admission: RE | Admit: 2024-02-16 | Discharge: 2024-02-16 | Disposition: A | Discharge status: Home / Self Care | Source: Ambulatory Visit | Attending: Cardiovascular Disease | Admitting: Cardiovascular Disease

## 2024-02-16 DIAGNOSIS — R0609 Other forms of dyspnea: Secondary | ICD-10-CM | POA: Diagnosis present

## 2024-02-16 LAB — ECHOCARDIOGRAM COMPLETE
Area-P 1/2: 3.01 cm2
S' Lateral: 3.03 cm

## 2024-03-16 ENCOUNTER — Other Ambulatory Visit: Payer: Self-pay | Admitting: Internal Medicine

## 2024-03-24 ENCOUNTER — Other Ambulatory Visit: Payer: Self-pay | Admitting: Internal Medicine

## 2025-01-27 ENCOUNTER — Encounter: Admitting: Internal Medicine
